# Patient Record
Sex: Female | Born: 1935 | Race: White | Hispanic: No | Marital: Married | Smoking: Former smoker
Health system: Southern US, Community
[De-identification: ages and names within clinical notes are randomized; demographics above are authoritative.]

## PROBLEM LIST (undated history)

## (undated) DIAGNOSIS — M5430 Sciatica, unspecified side: Secondary | ICD-10-CM

## (undated) DIAGNOSIS — M5126 Other intervertebral disc displacement, lumbar region: Secondary | ICD-10-CM

## (undated) DIAGNOSIS — M5136 Other intervertebral disc degeneration, lumbar region: Secondary | ICD-10-CM

## (undated) DIAGNOSIS — K589 Irritable bowel syndrome without diarrhea: Secondary | ICD-10-CM

## (undated) DIAGNOSIS — W19XXXA Unspecified fall, initial encounter: Secondary | ICD-10-CM

## (undated) DIAGNOSIS — G500 Trigeminal neuralgia: Secondary | ICD-10-CM

## (undated) DIAGNOSIS — K219 Gastro-esophageal reflux disease without esophagitis: Secondary | ICD-10-CM

## (undated) DIAGNOSIS — I639 Cerebral infarction, unspecified: Secondary | ICD-10-CM

## (undated) DIAGNOSIS — I609 Nontraumatic subarachnoid hemorrhage, unspecified: Secondary | ICD-10-CM

## (undated) DIAGNOSIS — M25569 Pain in unspecified knee: Secondary | ICD-10-CM

## (undated) DIAGNOSIS — K635 Polyp of colon: Secondary | ICD-10-CM

## (undated) DIAGNOSIS — K5792 Diverticulitis of intestine, part unspecified, without perforation or abscess without bleeding: Secondary | ICD-10-CM

## (undated) DIAGNOSIS — R42 Dizziness and giddiness: Secondary | ICD-10-CM

## (undated) DIAGNOSIS — G458 Other transient cerebral ischemic attacks and related syndromes: Secondary | ICD-10-CM

## (undated) DIAGNOSIS — M1990 Unspecified osteoarthritis, unspecified site: Secondary | ICD-10-CM

## (undated) DIAGNOSIS — M51369 Other intervertebral disc degeneration, lumbar region without mention of lumbar back pain or lower extremity pain: Secondary | ICD-10-CM

## (undated) DIAGNOSIS — N200 Calculus of kidney: Secondary | ICD-10-CM

## (undated) DIAGNOSIS — E7800 Pure hypercholesterolemia, unspecified: Secondary | ICD-10-CM

## (undated) DIAGNOSIS — C801 Malignant (primary) neoplasm, unspecified: Secondary | ICD-10-CM

## (undated) HISTORY — PX: PROLAPSED UTERINE FIBROID LIGATION: SHX5400

## (undated) HISTORY — DX: Polyp of colon: K63.5

## (undated) HISTORY — PX: FOOT SURGERY: SHX648

## (undated) HISTORY — DX: Nontraumatic subarachnoid hemorrhage, unspecified: I60.9

## (undated) HISTORY — PX: EYE SURGERY: SHX253

## (undated) HISTORY — DX: Cerebral infarction, unspecified: I63.9

## (undated) HISTORY — PX: LITHOTRIPSY: SUR834

## (undated) HISTORY — PX: BREAST BIOPSY: SHX20

## (undated) HISTORY — PX: CATARACT EXTRACTION: SUR2

## (undated) HISTORY — DX: Dizziness and giddiness: R42

## (undated) HISTORY — DX: Diverticulitis of intestine, part unspecified, without perforation or abscess without bleeding: K57.92

## (undated) HISTORY — PX: BREAST SURGERY: SHX581

## (undated) HISTORY — PX: OTHER SURGICAL HISTORY: SHX169

## (undated) HISTORY — DX: Other transient cerebral ischemic attacks and related syndromes: G45.8

---
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
## 2008-04-20 ENCOUNTER — Ambulatory Visit: Payer: Self-pay | Admitting: Internal Medicine

## 2008-04-20 ENCOUNTER — Encounter: Payer: Self-pay | Admitting: Gastroenterology

## 2008-04-24 LAB — CONVERTED CEMR LAB
Band Neutrophils: 0 % (ref 0–10)
Basophils Absolute: 0 10*3/uL (ref 0.0–0.1)
Basophils Relative: 1 % (ref 0–1)
Eosinophils Absolute: 0.1 10*3/uL (ref 0.0–0.7)
Eosinophils Relative: 2 % (ref 0–5)
Free T4: 0.74 ng/dL — ABNORMAL LOW (ref 0.89–1.80)
HCT: 41.5 % (ref 36.0–46.0)
Hemoglobin: 13.7 g/dL (ref 12.0–15.0)
IgA: 353 mg/dL (ref 68–378)
Lymphocytes Relative: 42 % (ref 12–46)
Lymphs Abs: 2.6 10*3/uL (ref 0.7–4.0)
MCHC: 33 g/dL (ref 30.0–36.0)
MCV: 88.7 fL (ref 78.0–100.0)
Monocytes Absolute: 0.6 10*3/uL (ref 0.1–1.0)
Monocytes Relative: 10 % (ref 3–12)
Neutro Abs: 2.8 10*3/uL (ref 1.7–7.7)
Neutrophils Relative %: 45 % (ref 43–77)
Platelets: 204 10*3/uL (ref 150–400)
RBC: 4.68 M/uL (ref 3.87–5.11)
RDW: 14.4 % (ref 11.5–15.5)
TSH: 1.498 microintl units/mL (ref 0.350–4.50)
Tissue Transglutaminase Ab, IgA: 2 units (ref <7)
WBC: 6.1 10*3/uL (ref 4.0–10.5)

## 2008-04-27 ENCOUNTER — Encounter: Payer: Self-pay | Admitting: Internal Medicine

## 2008-05-04 ENCOUNTER — Encounter: Payer: Self-pay | Admitting: Gastroenterology

## 2008-05-07 ENCOUNTER — Encounter: Payer: Self-pay | Admitting: Internal Medicine

## 2008-05-27 ENCOUNTER — Ambulatory Visit (HOSPITAL_COMMUNITY): Admission: RE | Admit: 2008-05-27 | Discharge: 2008-05-27 | Payer: Self-pay | Admitting: Internal Medicine

## 2008-05-27 ENCOUNTER — Encounter: Payer: Self-pay | Admitting: Internal Medicine

## 2008-05-27 ENCOUNTER — Ambulatory Visit: Payer: Self-pay | Admitting: Internal Medicine

## 2008-05-27 HISTORY — PX: COLONOSCOPY: SHX5424

## 2008-05-27 HISTORY — PX: ESOPHAGOGASTRODUODENOSCOPY: SHX1529

## 2008-05-29 ENCOUNTER — Encounter: Payer: Self-pay | Admitting: Internal Medicine

## 2008-08-14 DIAGNOSIS — E785 Hyperlipidemia, unspecified: Secondary | ICD-10-CM | POA: Insufficient documentation

## 2008-08-14 DIAGNOSIS — R159 Full incontinence of feces: Secondary | ICD-10-CM | POA: Insufficient documentation

## 2008-08-14 DIAGNOSIS — R42 Dizziness and giddiness: Secondary | ICD-10-CM | POA: Insufficient documentation

## 2008-08-14 DIAGNOSIS — R109 Unspecified abdominal pain: Secondary | ICD-10-CM | POA: Insufficient documentation

## 2008-08-14 DIAGNOSIS — K219 Gastro-esophageal reflux disease without esophagitis: Secondary | ICD-10-CM | POA: Insufficient documentation

## 2008-08-14 DIAGNOSIS — D126 Benign neoplasm of colon, unspecified: Secondary | ICD-10-CM | POA: Insufficient documentation

## 2008-08-25 ENCOUNTER — Ambulatory Visit: Payer: Self-pay | Admitting: Internal Medicine

## 2008-08-25 ENCOUNTER — Encounter: Payer: Self-pay | Admitting: Gastroenterology

## 2008-08-25 DIAGNOSIS — R197 Diarrhea, unspecified: Secondary | ICD-10-CM | POA: Insufficient documentation

## 2008-08-25 DIAGNOSIS — R1031 Right lower quadrant pain: Secondary | ICD-10-CM | POA: Insufficient documentation

## 2009-12-27 ENCOUNTER — Ambulatory Visit (HOSPITAL_COMMUNITY): Admission: RE | Admit: 2009-12-27 | Discharge: 2009-12-27 | Payer: Self-pay | Admitting: Pulmonary Disease

## 2010-03-23 NOTE — Assessment & Plan Note (Signed)
Summary: fu ov 3 mo from procedure/ams   Visit Type:  Follow-up Visit Primary Care Provider:  Karyl Kinnier  Chief Complaint:  F/U procedure.  History of Present Illness: Patient is here for 3 month f/u.  She has h/o intermittent bouts of abd pain and diarrhea with sometimes months or years inbetween episodes.  Recent TCS/EGD showed small hh, hyperplastic rectal polyp, sigmoid tics, benign random colon biopsies.  She has tried Hyomax twice for mild abd discomfort with good relief.  She has had some heartburn on protonix and wants to go back on prilosec otc.  This was switched recently to see if prilosec was the cause of her gi symptoms.  She has one to two bms daily.  She has infrequent RLQ stabbing pain which is short-lived.     Current Medications (verified): 1)  Citracal/vitamin D 250-200 Mg-Unit Tabs (Calcium Citrate-Vitamin D) .... Once Daily 2)  B Complex  Tabs (B Complex Vitamins) .... Once Daily 3)  Niacin 500 Mg Tabs (Niacin) .... One By Mouth Bid 4)  Vitamin E 400 Unit Caps (Vitamin E) .... Once Daily 5)  Garlic 500 Mg Tabs (Garlic) .... Once Daily 6)  Meclizine Hcl 25 Mg Tabs (Meclizine Hcl) .... As Needed 7)  Protonix 40 Mg Tbec (Pantoprazole Sodium) .... Take 1 Tablet By Mouth Once A Day 8)  Digestive Advantage .... Once Daily 9)  Lopid 600 Mg Tabs (Gemfibrozil) .... Once Daily 10)  Citracal Plus .... Once Daily 11)  Vitamin C .... Once Daily 12)  Levsin/sl 0.125 Mg Subl (Hyoscyamine Sulfate) .... As Needed  Allergies (verified): No Known Drug Allergies  Review of Systems General:  Denies fever, chills, and weight loss. CV:  Denies chest pains and dyspnea on exertion. Resp:  Denies dyspnea at rest, dyspnea with exercise, and cough. GI:  See HPI. GU:  Denies urinary burning, blood in urine, abnormal vaginal bleeding, and vaginal discharge. Heme:  Denies bleeding.  Vital Signs:  Patient profile:   77 year old female Height:      60 inches Weight:      155.50 pounds  BMI:     30.48 Temp:     97.4 degrees F oral Pulse rate:   64 / minute BP sitting:   110 / 80  (left arm) Cuff size:   regular  Vitals Entered By: Cloria Spring LPN (August 25, 1608 9:24 AM)  Physical Exam  General:  Well developed, well nourished, no acute distress. Head:  Normocephalic and atraumatic. Eyes:  Sclera nonicteric Mouth:  OP moist Abdomen:  Bowel sounds normal.  Abdomen is soft, nontender, nondistended.  No rebound or guarding.  No hepatosplenomegaly, masses or hernias.  No abdominal bruits.  Extremities:  No clubbing, cyanosis, edema or deformities noted. Neurologic:  Alert and  oriented x4;  grossly normal neurologically. Skin:  Intact without significant lesions or rashes. Psych:  Alert and cooperative. Normal mood and affect.  Impression & Recommendations:  Problem # 1:  ABDOMINAL PAIN (ICD-789.00)  Intermittent abd cramps and diarrhea.  TCS unrevealing.  No further symptoms since initially seen in 3/10.  Will continue to monitor for now and she will use hyoscyamine sl as needed.  Problem # 2:  ABDOMINAL PAIN-RLQ (ICD-789.03)  Seems to be separate from above symptoms.  She will keep note of episodes so we can determine how frequent.  Currently c/o "rare" pain.  Encouraged her to consider gyn exam.  If symptoms persist, consider CT A/P.  She will let us know.  Problem # 3:  GERD (ICD-530.81)  Better control on omeprazole OTC.  She will resume 20mg  daily.  Stop protonix.    Problem # 4:  COLONIC POLYPS, HYPERPLASTIC (ICD-211.3) Due for TCS in 05/2018 health permitting.  Other Orders: Est. Patient Level II ([address])

## 2010-03-23 NOTE — Letter (Signed)
Summary: Internal Other/triage  Internal Other/triage   Imported By: Cloria Spring LPN 08/65/7846 96:29:52  _____________________________________________________________________  External Attachment:    Type:   Image     Comment:   External Document

## 2010-03-23 NOTE — Miscellaneous (Signed)
Summary: needs EGD/TCS    Please let patient know, I spoke with Dr. Jena Gauss and he wants to proceed with EGD/TCS to further evaluate diarrhea and chronic GERD.  Please schedule.  Appended Document: needs EGD/TCS Pt is scheduled for 05/27/08 @ 8:00 AM for EGD/TCS. She said she spoke with Verlon Au about the  difficulty she has had previously with drinking alot of liquid.Would like to know if she can do something different than the regular Prep.  Appended Document: needs EGD/TCS Recommend Miralax prep with the Dulcolax tablets.  If she is having any issues with constipation right now she should also take an additional 10mg  of Dulcolax by mouth the day before she starts the Miralax prep.  Let me know if you have any questions.  Appended Document: needs EGD/TCS [facility] for pt to call.  Appended Document: needs EGD/TCS Pt informed. Copy of Miralax Prep mailed to pt.

## 2010-03-23 NOTE — Letter (Signed)
Summary: release of records form  release of records form   Imported By: Elinor Parkinson 08/25/2008 10:01:48  _____________________________________________________________________  External Attachment:    Type:   Image     Comment:   External Document

## 2010-03-23 NOTE — Letter (Signed)
Summary: Patient Notice, Colon Biopsy Results  [facility]  [address]   [place], [place] [address]   Phone: [phone]  Fax: [phone]       May 29, 2008   JAYMEE TILSON [address] [place], [place]  [address] 05-Sep-1931    Dear Ms. Brearley,  I'm pleased to inform you that the biopsies taken from your colon recently showed no sign of cancer,  inflammation or infection. Pllease continue with the treatment plan as outlined on the day of your exam.  Call us if you are having persistent problems or have questions about your condition that have not been fully answered at this time.  Sincerely,    R. Roetta Sessions MD  [facility] Ph: [phone]    Fax: [phone]

## 2010-07-05 NOTE — Op Note (Signed)
NAME:  Shari Vincent, Shari Vincent               ACCOUNT NO.:  [id number]   MEDICAL RECORD NO.:  [id number]          PATIENT TYPE:  AMB   LOCATION:  DAY                           FACILITY:  [facility]   PHYSICIAN:  R. Roetta Sessions, M.D. DATE OF BIRTH:  [birth year]   DATE OF PROCEDURE:  05/27/2008  DATE OF DISCHARGE:                               OPERATIVE REPORT   Diagnostic EGD followed by colonoscopy and biopsy.   INDICATIONS FOR PROCEDURE:  A 77 year old lady with a intermittent bouts  of abdominal pain and diarrhea, punctuated by months and sometimes years  in between symptoms.  We saw her in the office in consultation on April 20, 2008 at the request of Dr. Karyl Kinnier up in [place].  Her celiac  antibody panel came back negative.  Records regarding colonoscopy done  previously by Dr. Alen Bleacher demonstrated left-sided diverticula and  hyperplastic rectal polyp.  She does describe rather strange symptoms at  times, including dizziness and diaphoresis with attacks.  Her attacks  are few and far between.  For instance, her attack was in February of  this year, one before that was in August 2009.  She does relate gas,  bloating and GI distress with certain foods including broccoli and  cauliflower.  EGD and colonoscopy are now being done to further evaluate  her symptoms.  She has longstanding gastroesophageal reflux disease that  has been well-controlled on omeprazole.  Risks, benefits, alternatives  and limitations have discussed, questions answered.  Please see the  documentation in the medical record.   PROCEDURE NOTE:  Oxygen saturation, blood pressure, pulse oximetry were  monitored throughout the entirety of both procedures.   CONSCIOUS SEDATION:  Versed 5 mg IV and Demerol 75 mg IV in divided  doses.   INSTRUMENT:  Pentax video chip system.   FINDINGS:  EGD:  Examination of the tubular esophagus revealed normal-  appearing esophageal mucosa.  EG junction was easily traversed.   Stomach:   Gastric cavity was empty, insufflated well with air.  Thorough  examination of the gastric mucosa, including retroflexed in the proximal  stomach, esophagogastric junction demonstrated a small hiatal hernia and  minimal polypoid appearing antral mucosa.  This mucosa appeared to be  benign.  There is no infiltrating process, ulcer or other abnormality.  Pylorus was patent and easily traversed.  Examination of the bulb  second, and third portion revealed fairly normal appearing duodenal  mucosa.   THERAPEUTIC/DIAGNOSTIC MANEUVERS PERFORMED:  None.   The patient tolerated procedure well and was prepared for colonoscopy.   Digital rectal exam revealed no abnormalities.   Endoscopic findings: The prep was good.   Colon:  The colonic mucosa was surveyed from the rectosigmoid junction  through the left, transverse and right colon into the appendiceal  orifice, ileocecal valve and cecum.  These structures were well seen and  photographed for the record.  I attempted to intubate the ileocecal  valve and was unable to do so.  From this level, the scope was slowly  and cautiously withdrawn.  All previously mentioned mucosal surfaces  were again seen.  The patient has scattered sigmoid diverticula.  Remainder of the colonic mucosa appeared normal.  I biopsied the mid  descending segment to rule out microscopic colitis.  I  pulled the scope  down to the rectum where the patient had a couple mammillations  and a  hyperplastic polyp which was cold biopsied. I also biopsied the rectal  mucosa to rule out microscopic colitis.  The rectal vault small, I was  unable to retroflex but for the same reason I was able see the rectal  mucosa en face and it appeared normal.  Cecal withdrawal time 9 minutes.  The patient tolerated both procedures well, was reactive in endoscopy.   IMPRESSION ON EGD:  1. Normal esophagus.  2. Minimal polypoid antral mucosa, not significant.  3. Small hiatal hernia.  4.  Otherwise stomach appeared normal polyp.  5. Pylorus patent, normal D1 through D3.   COLONOSCOPY FINDINGS:  1. Diminutive rectal polyp, status post cold biopsy removal.  2. Status biopsy to rule out microscopic proctocolitis.  3. Sigmoid diverticula.  4. Remainder of colonic mucosa appeared normal, status post biopsy of      descending segment.   RECOMMENDATIONS:  1. Will just have her stop omeprazole and start her on Protonix 40 mg      orally daily as omeprazole is rarely abdominal pain, diarrhea.  2. Levsin sublingual 0.125 mg to have on hand in case of an attack.      She is to take one under the tongue if she has an attack, limit to      no more than 3 daily.  3. Digestive Advantage for gas bloat/diarrhea 1 capsule daily.  4. Follow-up on path.  5. Appointment to see [place] back in the office in 3 months.      Jonathon Bellows, M.D.  Electronically Signed     RMR/MEDQ  D:  05/27/2008  T:  05/27/2008  Job:  [id number]   cc:   Karyl Kinnier, MD  [place], [place]

## 2010-07-05 NOTE — Consult Note (Signed)
NAME:  Shari Vincent, Shari Vincent               ACCOUNT NO.:  [id number]   MEDICAL RECORD NO.:  [id number]          PATIENT TYPE:  AMB   LOCATION:  DAY                           FACILITY:  [facility]   PHYSICIAN:  Shari Vincent, M.D. DATE OF BIRTH:  01/13/1932   DATE OF CONSULTATION:  04/20/2008  DATE OF DISCHARGE:                                 CONSULTATION   REASON FOR CONSULTATION:  Intermittent abdominal pain and diarrhea with  stool incontinence.   PHYSICIAN REQUESTING CONSULTATION:  Shari Kinnier, MD   HISTORY OF PRESENT ILLNESS:  The patient is a very pleasant 77 year old  lady, patient of Dr. Karyl Vincent, who presents today at her request  for further evaluation of intermittent abdominal pain associated with  diarrhea and fecal incontinence.  The patient tells me she has had these  symptoms for well over 30 years.  Her symptoms have been very sporadic  and actually at times, she may go 2 or 3 years at a time without any  problems.  However, over the last 6 months, she has had 2 severe  episodes, which she calls flares.  Her symptoms usually begin after a  meal.  The last time, she has had some chicken and rice soup.  Around 10  p.m., she started feeling some epigastric discomfort.  The pain then  localizes around the right lower quadrant region.  She becomes very hot  and sweaty.  She usually has multiple stools with loose stools and  becomes faint, and usually she will lay down on the floor in her  bathroom and become incontinent of stool.  She generally has 1 or 2  episodes like this and then the symptoms go away.  This last time, her  symptoms lasted for about 6 hours and she had about 3 episodes of  profuse diarrhea associated with this.  She generally does have some  limited vomiting.  She denies any blood in the stool or melena.  Generally, she does not have fever, although last episode, she had a  fever of 100 towards the end of her symptoms.  In between, generally  does not have  any GI problems.  She denies any interim abdominal pain.  She states she has about 1-2 stools a day, although she notes that her  stools are very thin ever since her colonoscopy in 2006.  She notes that  if she eats broccoli or certain beans, she will have some rectal  burning, and she uses Preparation H for this.  She does have  intermittent nausea associated with dizziness for which she takes  meclizine for years.  She denies any weight loss.  Again, her only  workup has been a screening colonoscopy by Dr. Aleene Vincent and she tells  me that it was normal.  She does have chronic GERD, has been on  omeprazole for years with good results.  Recently, labs included LFTs,  which were normal.  She was given a trial of dicyclomine to use as  needed, but she has not had any further episodes.   CURRENT MEDICATIONS:  1. Omeprazole 20 mg daily.  2. Citracal with vitamin D daily.  3. Multivitamin with magnesium daily.  4. Vitamin B complex daily.  5. Niacin 1 daily.  6. Vitamin E 400 International Unit daily.  7. Garlic daily.  8. Meclizine 25 mg p.Sharin.   ALLERGIES:  No known drug allergies.   PAST MEDICAL HISTORY:  1. Chronic GERD.  2. Hypercholesterolemia.  3. Vertigo.  4. History of kidney stones, requiring lithotripsy as well as a stent      back in 1992.  5. Colonoscopy as above.  6. She had surgery for uterine prolapse in 1960.  7. She had benign right breast biopsy in 1990.  8. She had foot surgery in 2000 due to her fracture.  9. Bilateral cataract extractions.  10.She had right shoulder tendon repair in 2008.   FAMILY HISTORY:  Paternal grandmother had intestinal cancer.  Maternal  grandmother had stomach cancer.  No family history of IBD or celiac  disease.   SOCIAL HISTORY:  She is married and has 3 children.  She is retired.  She quit smoking in 1972.  No alcohol use.   REVIEW OF SYSTEMS:  GI:  See HPI.  CONSTITUTIONAL:  No weight loss.  CARDIOPULMONARY:  No chest pain,  shortness of breath, palpitations, or  cough.  GENITOURINARY:  No dysuria or hematuria.   PHYSICAL EXAMINATION:  VITAL SIGNS:  Weight 162, height 5 feet, temp  97.4, blood pressure 128/80, and pulse 60.  GENERAL:  Pleasant, elderly Caucasian female in no acute distress.  SKIN:  Warm and dry.  No jaundice.  HEENT:  Sclerae nonicteric.  Oropharyngeal mucosa moist and pink.  No  lesions, erythema, or exudate.  No lymphadenopathy or thyromegaly.  CHEST:  Lungs are clear to auscultation.  CARDIOVASCULAR:  Regular rate and rhythm.  Normal S1 and S2.  No  murmurs, rubs, or gallops.  ABDOMEN:  Positive bowel sounds.  Abdomen is soft, nontender, and  nondistended.  No organomegaly or masses.  No rebound or guarding.  No  abdominal bruits or hernias.  RECTAL:  No lesions externally.  No masses in the rectal vault.  No  stool present, brown secretion, and heme negative.  She has good  anorectal tone.  Rectal exam is nontender.  LOWER EXTREMITIES:  No edema.   IMPRESSION:  Shari Vincent is a 77 year old lady with chronic intermittent  bouts with abdominal pain and diarrhea, which has been reoccurring for  over 30 years.  Symptoms are very sporadic and may occur every couple of  years, but recently had been more frequent.  In between, she really has  no gastrointestinal symptoms, although, she notes that her stools are  more thin in caliber.  She does have chronic gastroesophageal reflux  disease, which is well controlled on omeprazole.  She has not noted any  particular food, which causes her symptoms.  Family history is  significant for reported intestinal and stomach cancer.  Etiology of  the symptoms are unclear at this time.  We would consider celiac  disease, inflammatory bowel disease, microscopic colitis, stricture or  intermittent obstruction, irritable bowel syndrome all as potential  possibilities.  She has chronic gastroesophageal reflux disease well  controlled, has never had  endoscopic evaluation and would offer one at  some point as well to rule out complications such as Barrett esophagus.   PLAN:  1. Initially, we will retrieve records from Dr. Vangie Vincent office      regarding her prior colonoscopy.  2. We will check a TSH,  free T4, CBC, celiac antibody panel, and IgA      level.  3. We will likely offer her a colonoscopy with random biopsies plus or      minus EGD in the near future.  Based on these findings, she may      need to have imaging studies just to make sure that she has nothing      to suggest strictures or obstruction.  4. Further recommendations to follow.   I would like to thank Dr. Karyl Vincent for allowing [place] to take part in  the care of this patient.      Tana Coast, P.AJonathon Bellows, M.D.  Electronically Signed    LL/MEDQ  D:  04/20/2008  T:  04/21/2008  Job:  [id number]   cc:   Shari Kinnier, MD

## 2010-09-05 ENCOUNTER — Emergency Department (HOSPITAL_COMMUNITY)
Admission: EM | Admit: 2010-09-05 | Discharge: 2010-09-05 | Disposition: A | Discharge status: Home / Self Care | Payer: Medicare Other | Attending: Emergency Medicine | Admitting: Emergency Medicine

## 2010-09-05 ENCOUNTER — Encounter: Payer: Self-pay | Admitting: *Deleted

## 2010-09-05 ENCOUNTER — Emergency Department (HOSPITAL_COMMUNITY): Payer: Medicare Other

## 2010-09-05 DIAGNOSIS — R110 Nausea: Secondary | ICD-10-CM | POA: Insufficient documentation

## 2010-09-05 DIAGNOSIS — R51 Headache: Secondary | ICD-10-CM | POA: Insufficient documentation

## 2010-09-05 HISTORY — DX: Gastro-esophageal reflux disease without esophagitis: K21.9

## 2010-09-05 HISTORY — DX: Pure hypercholesterolemia, unspecified: E78.00

## 2010-09-05 LAB — BASIC METABOLIC PANEL
BUN: 13 mg/dL (ref 6–23)
CO2: 23 mEq/L (ref 19–32)
Calcium: 10.1 mg/dL (ref 8.4–10.5)
Chloride: 105 mEq/L (ref 96–112)
Creatinine, Ser: 0.71 mg/dL (ref 0.50–1.10)
GFR calc Af Amer: >60 mL/min (ref >60)
GFR calc non Af Amer: >60 mL/min (ref >60)
Glucose, Bld: 102 mg/dL — ABNORMAL HIGH (ref 70–99)
Potassium: 3.8 mEq/L (ref 3.5–5.1)
Sodium: 141 mEq/L (ref 135–145)

## 2010-09-05 LAB — SEDIMENTATION RATE: Sed Rate: 10 mm/hr (ref 0–22)

## 2010-09-05 MED ORDER — SODIUM CHLORIDE 0.9 % IV SOLN: Freq: Once | INTRAVENOUS | Status: DC

## 2010-09-05 MED ORDER — KETOROLAC TROMETHAMINE 30 MG/ML IJ SOLN
30.0000 mg | Freq: Once | INTRAMUSCULAR | Status: AC
  Administered 2010-09-05: 30 mg via INTRAVENOUS
  Filled 2010-09-05: qty 1

## 2010-09-05 MED ORDER — PROMETHAZINE HCL 25 MG PO TABS: 25.0000 mg | ORAL_TABLET | Freq: Four times a day (QID) | ORAL | Status: AC | PRN

## 2010-09-05 MED ORDER — HYDROMORPHONE HCL 1 MG/ML IJ SOLN
1.0000 mg | Freq: Once | INTRAMUSCULAR | Status: AC
  Administered 2010-09-05: 1 mg via INTRAVENOUS
  Filled 2010-09-05: qty 1

## 2010-09-05 MED ORDER — MORPHINE SULFATE 2 MG/ML IJ SOLN
2.0000 mg | Freq: Once | INTRAMUSCULAR | Status: AC
  Administered 2010-09-05: 2 mg via INTRAVENOUS
  Filled 2010-09-05: qty 1

## 2010-09-05 MED ORDER — OXYCODONE-ACETAMINOPHEN 5-325 MG PO TABS: 2.0000 | ORAL_TABLET | ORAL | Status: AC | PRN

## 2010-09-05 MED ORDER — PROMETHAZINE HCL 25 MG/ML IJ SOLN
25.0000 mg | Freq: Once | INTRAMUSCULAR | Status: AC
  Administered 2010-09-05: 25 mg via INTRAVENOUS
  Filled 2010-09-05: qty 1

## 2010-09-05 NOTE — ED Notes (Signed)
Pt was given a cup of ice water. Family at bedside. NAD noted at this time.

## 2010-09-05 NOTE — ED Notes (Signed)
New shift. edp in to eval

## 2010-09-05 NOTE — ED Provider Notes (Signed)
History     Chief Complaint  Patient presents with  . Headache    Pt c/o headache on and off x 1 month. Pt states she woke up with the worst pain ever in her head this morning. Pt states she has had some blurred vision in the past month.   Patient is a 77 y.o. female presenting with headaches. The history is provided by the patient and a relative. No language interpreter was used.  Headache  This is a new problem. The current episode started 3 to 5 hours ago. The problem occurs constantly. The problem has not changed since onset.The headache is associated with nothing. The pain is located in the temporal region. The quality of the pain is described as throbbing. The pain is moderate. The pain does not radiate. Associated symptoms include nausea. Pertinent negatives include no fever, no chest pressure, no shortness of breath and no vomiting. Treatments tried: cold compress. The treatment provided mild relief.  C/o throbbing HA onset approximately 3 hours ago with associated nausea and persistent since. States HA located behind her eyes. HA is aggravated by bending over and relieved with cold compress. Notes HA is not aggravated with eating or head movement. Per family member, patient has had persistent HAs for multiple weeks. Family member also notes patient was seen 2 weeks ago at [facility] and was told if HA persisted that she would need a CT-Head otherwise the likely cause of the HAs was a result of sinus pressure. Denies fever, blurred vision, decreased appetite, diaphoresis, vomiting and jaw pain.  Past Medical History  Diagnosis Date  . High cholesterol   . Acid reflux     Past Surgical History  Procedure Date  . Foot surgery   . Uterus tacking     Family History  Problem Relation Age of Onset  . Diabetes Mother   . Stroke Father     History  Substance Use Topics  . Smoking status: Not on file  . Smokeless tobacco: Not on file  . Alcohol Use: No    OB History    Grav Para  Term Preterm Abortions TAB SAB Ect Mult Living                  Review of Systems  Constitutional: Negative for fever.  Respiratory: Negative for shortness of breath.   Gastrointestinal: Positive for nausea. Negative for vomiting.  Neurological: Positive for headaches.  All other systems reviewed and are negative.  All other systems negative except as noted in HPI.   Physical Exam  BP 145/65  Pulse 61  Temp 98.4 F (36.9 C)  Resp 20  Ht 5' (1.524 m)  Wt 159 lb (72.122 kg)  BMI 31.05 kg/m2  SpO2 99%  Physical Exam  Nursing note and vitals reviewed. Constitutional: She is oriented to person, place, and time. She appears well-developed and well-nourished.       Hypertensive.   HENT:  Head: Normocephalic and atraumatic.       Tenderness over right temporal region.   Eyes: Conjunctivae and EOM are normal. Pupils are equal, round, and reactive to light.  Neck: Normal range of motion. Neck supple.       No carotid bruits.   Cardiovascular: Normal rate, regular rhythm, normal heart sounds, intact distal pulses and normal pulses.  Exam reveals no gallop and no friction rub.   No murmur heard. Pulmonary/Chest: Effort normal and breath sounds normal. She has no wheezes.  Abdominal: Soft. Bowel sounds are  normal. She exhibits no distension. There is no tenderness.  Musculoskeletal: Normal range of motion.  Neurological: She is alert and oriented to person, place, and time. She has normal strength. She is not disoriented. No cranial nerve deficit or sensory deficit.  Skin: Skin is warm and dry.  Psychiatric: She has a normal mood and affect. Her behavior is normal.    ED Course  Procedures  MDM Right temporal ha in elderly lady who rarely gets ha. Described as sharp and throbbing.  Neuro exam nl. No meningeal signs. Afebrile. Head ct no mass or stroke.   Sed rate nl. No TA.   Ha resolved in ed.    Chart written by Danielle Dess for Dr. Weldon Inches  I personally performed  the services described in this documentation, which was scribed in my presence. The recorded information has been reviewed and considered.  9:45 AM pt says pain has "eased off" but is not gone.  I explained results of ct and delay with one more lab.  11:43 AM pain improved but not gone. Explained esr also neg and no signs of TA.  Nicholes Stairs, MD 09/05/10 1304

## 2010-09-05 NOTE — ED Notes (Signed)
Pt assisted to bathroom. States her head is hurting really bad. Lights hurt her eyes.

## 2010-09-05 NOTE — ED Notes (Signed)
Pt also c/o nausea and took 1 tramadol without relief.

## 2010-09-05 NOTE — ED Notes (Signed)
Pt states pain med helped some. States her head still hurts.

## 2010-09-07 ENCOUNTER — Other Ambulatory Visit: Payer: Self-pay | Admitting: Neurology

## 2010-09-09 ENCOUNTER — Ambulatory Visit (HOSPITAL_COMMUNITY)
Admission: RE | Admit: 2010-09-09 | Discharge: 2010-09-09 | Disposition: A | Discharge status: Home / Self Care | Payer: Medicare Other | Source: Ambulatory Visit | Attending: Neurology | Admitting: Neurology

## 2010-09-09 DIAGNOSIS — R42 Dizziness and giddiness: Secondary | ICD-10-CM | POA: Insufficient documentation

## 2010-09-09 DIAGNOSIS — R51 Headache: Secondary | ICD-10-CM | POA: Insufficient documentation

## 2010-09-09 DIAGNOSIS — G458 Other transient cerebral ischemic attacks and related syndromes: Secondary | ICD-10-CM | POA: Insufficient documentation

## 2010-09-09 DIAGNOSIS — G319 Degenerative disease of nervous system, unspecified: Secondary | ICD-10-CM | POA: Insufficient documentation

## 2011-03-02 ENCOUNTER — Other Ambulatory Visit: Payer: Self-pay | Admitting: Neurology

## 2011-03-02 ENCOUNTER — Ambulatory Visit (HOSPITAL_COMMUNITY)
Admission: RE | Admit: 2011-03-02 | Discharge: 2011-03-02 | Disposition: A | Discharge status: Home / Self Care | Payer: Medicare Other | Source: Ambulatory Visit | Attending: Neurology | Admitting: Neurology

## 2011-03-02 DIAGNOSIS — H538 Other visual disturbances: Secondary | ICD-10-CM

## 2011-03-02 DIAGNOSIS — I6529 Occlusion and stenosis of unspecified carotid artery: Secondary | ICD-10-CM | POA: Insufficient documentation

## 2011-03-23 ENCOUNTER — Emergency Department (HOSPITAL_COMMUNITY)
Admission: EM | Admit: 2011-03-23 | Discharge: 2011-03-23 | Discharge status: Absent without leave | Payer: Self-pay | Source: Home / Self Care

## 2011-08-22 ENCOUNTER — Other Ambulatory Visit (HOSPITAL_COMMUNITY): Payer: Self-pay | Admitting: Nurse Practitioner

## 2011-08-22 DIAGNOSIS — R930 Abnormal findings on diagnostic imaging of skull and head, not elsewhere classified: Secondary | ICD-10-CM

## 2011-08-28 ENCOUNTER — Encounter (HOSPITAL_COMMUNITY): Payer: Self-pay

## 2011-08-28 ENCOUNTER — Ambulatory Visit (HOSPITAL_COMMUNITY)
Admission: RE | Admit: 2011-08-28 | Discharge: 2011-08-28 | Disposition: A | Discharge status: Home / Self Care | Payer: Medicare Other | Source: Ambulatory Visit | Attending: Family Medicine | Admitting: Family Medicine

## 2011-08-28 DIAGNOSIS — R911 Solitary pulmonary nodule: Secondary | ICD-10-CM | POA: Insufficient documentation

## 2011-08-28 DIAGNOSIS — R930 Abnormal findings on diagnostic imaging of skull and head, not elsewhere classified: Secondary | ICD-10-CM

## 2011-08-28 HISTORY — DX: Malignant (primary) neoplasm, unspecified: C80.1

## 2011-08-28 MED ORDER — IOHEXOL 300 MG/ML  SOLN
80.0000 mL | Freq: Once | INTRAMUSCULAR | Status: AC | PRN
  Administered 2011-08-28: 80 mL via INTRAVENOUS

## 2012-07-23 ENCOUNTER — Encounter: Payer: Self-pay | Admitting: Orthopedic Surgery

## 2012-07-23 ENCOUNTER — Ambulatory Visit (INDEPENDENT_AMBULATORY_CARE_PROVIDER_SITE_OTHER): Payer: Medicare Other | Admitting: Orthopedic Surgery

## 2012-07-23 ENCOUNTER — Ambulatory Visit (INDEPENDENT_AMBULATORY_CARE_PROVIDER_SITE_OTHER): Payer: Medicare Other

## 2012-07-23 VITALS — BP 116/62 | Ht 60.0 in | Wt 159.0 lb

## 2012-07-23 DIAGNOSIS — M179 Osteoarthritis of knee, unspecified: Secondary | ICD-10-CM | POA: Insufficient documentation

## 2012-07-23 DIAGNOSIS — M25569 Pain in unspecified knee: Secondary | ICD-10-CM

## 2012-07-23 DIAGNOSIS — M1710 Unilateral primary osteoarthritis, unspecified knee: Secondary | ICD-10-CM

## 2012-07-23 DIAGNOSIS — I872 Venous insufficiency (chronic) (peripheral): Secondary | ICD-10-CM

## 2012-07-23 DIAGNOSIS — M25561 Pain in right knee: Secondary | ICD-10-CM

## 2012-07-23 DIAGNOSIS — I878 Other specified disorders of veins: Secondary | ICD-10-CM | POA: Insufficient documentation

## 2012-07-23 NOTE — Progress Notes (Signed)
Patient ID: Shari Vincent, female   DOB: 1931/08/11, 79 y.o.   MRN: [id number] Chief complaint right knee pain  This patient complains of gradual onset of anterior knee pain since March of this year she denies any trauma. She complains of sharp dull stabbing 5/10 constant knee pain which is worse when she's going up and down the stairs. She also notices some lateral knee pain and lateral leg pain and pain behind her right knee but denies any back pain.  Review of systems she has some wheezing and cough symptoms and constipation heartburn nausea and diarrhea urgency is noted changes in her skin with itching and redness especially in the lower legs she's been evaluated for venous stasis and has stockings she reports some dizziness and seasonal allergies although the other systems were negative  She's allergic to cephalosporin  She has a history of trigeminal neuralgia and acid reflux she's had uterine prolapse surgery cataracts left foot surgery biopsy of the right breast  She takes omeprazole 20 meclizine when necessary Centrum Silver vitamin C Citrucel plus vitamin D 3 vitamin B. super complex  His family history of heart disease cancer and diabetes  Social history she is married a housewife doesn't smoke or drink  BP 116/62  Ht 5' (1.524 m)  Wt 159 lb (72.122 kg)  BMI 31.05 kg/m2  General appearance is normal, the patient is alert and oriented x3 with normal mood and affect. Her body habitus is mesomorphic  She ambulates without any sick assistive devices.  Right and left upper extremities are aligned properly without contracture subluxation atrophy tremor or skin changes and pulses are normal in both hands  Left knee tenderness over the bursa medially otherwise no effusion range of motion is free and easy and normal stability and strength are normal skin is normal. Pulse and temperature are normal there is some edema and tenderness and redness consistent with venous stasis  disease  Right knee painful range of motion painful patellofemoral crepitance tenderness in the front of the knee lateral leg tenderness lateral compartment tenderness lumbar spine tenderness consistent with L5 root problem is redness also in the legs and some swelling consistent with peripheral edema and venous stasis disease she has multiple varicosities bilaterally. She has no skin rashes. Sensation is normal no pathologic reflexes coordination and balance are also normal  X-ray was done in the office it shows that she has a mildly mildly arthritic knee  Recommend injection and quadriceps strengthening return 2 months  Knee  Injection Procedure Note  Pre-operative Diagnosis: right knee oa  Post-operative Diagnosis: same  Indications: pain  Anesthesia: ethyl chloride   Procedure Details   Verbal consent was obtained for the procedure. Time out was completed.The joint was prepped with alcohol, followed by  Ethyl chloride spray and A 20 gauge needle was inserted into the knee via lateral approach; 4ml 1% lidocaine and 1 ml of depomedrol  was then injected into the joint . The needle was removed and the area cleansed and dressed.  Complications:  None; patient tolerated the procedure well.

## 2012-07-23 NOTE — Patient Instructions (Addendum)
You have received a steroid shot. 15% of patients experience increased pain at the injection site with in the next 24 hours. This is best treated with ice and tylenol extra strength 2 tabs every 8 hours. If you are still having pain please call the office.   Start home exercises

## 2012-09-24 ENCOUNTER — Ambulatory Visit (INDEPENDENT_AMBULATORY_CARE_PROVIDER_SITE_OTHER): Payer: Medicare Other | Admitting: Orthopedic Surgery

## 2012-09-24 VITALS — BP 140/78 | Ht 60.0 in | Wt 159.0 lb

## 2012-09-24 DIAGNOSIS — M1710 Unilateral primary osteoarthritis, unspecified knee: Secondary | ICD-10-CM

## 2012-09-24 DIAGNOSIS — M7051 Other bursitis of knee, right knee: Secondary | ICD-10-CM

## 2012-09-24 DIAGNOSIS — M76899 Other specified enthesopathies of unspecified lower limb, excluding foot: Secondary | ICD-10-CM

## 2012-09-24 NOTE — Progress Notes (Signed)
Patient ID: Shari Vincent, female   DOB: 05/23/1931, 79 y.o.   MRN: [id number] Chief Complaint  Patient presents with  . Follow-up    2 month recheck right knee following home exercises and injection    She has done well with home exercises and previous injection she says injections just about worn off of like a repeat injection if possible. Pain over her medial hamstrings and bursa area. The joints quiet flexion is normal strength is normal. Neurovascular exam is intact.  Ambulation is normal  Impression bursitis right knee  Recommend injection Knee, bursa  Injection Procedure Note  Pre-operative Diagnosis: right bursitis  Post-operative Diagnosis: same  Indications: pain  Anesthesia: ethyl chloride   Procedure Details   Verbal consent was obtained for the procedure. Time out was completed.The bursal area   was prepped with alcohol, followed by  Ethyl chloride spray and A 25 gauge needle was inserted into the right  pes bursa and  4ml 1% lidocaine and 1 ml of depomedrol  was then injected into the area  . The needle was removed and the area cleansed and dressed.  Complications:  None; patient tolerated the procedure well.

## 2013-02-19 ENCOUNTER — Other Ambulatory Visit: Payer: Self-pay | Admitting: Family Medicine

## 2013-02-19 DIAGNOSIS — R234 Changes in skin texture: Secondary | ICD-10-CM

## 2013-02-19 DIAGNOSIS — N644 Mastodynia: Secondary | ICD-10-CM

## 2013-02-26 ENCOUNTER — Ambulatory Visit
Admission: RE | Admit: 2013-02-26 | Discharge: 2013-02-26 | Disposition: A | Discharge status: Home / Self Care | Payer: Medicare Other | Source: Ambulatory Visit | Attending: Family Medicine | Admitting: Family Medicine

## 2013-02-26 ENCOUNTER — Other Ambulatory Visit: Payer: Self-pay | Admitting: Family Medicine

## 2013-02-26 DIAGNOSIS — N644 Mastodynia: Secondary | ICD-10-CM

## 2013-02-26 DIAGNOSIS — R234 Changes in skin texture: Secondary | ICD-10-CM

## 2013-03-03 ENCOUNTER — Other Ambulatory Visit: Payer: Self-pay | Admitting: Family Medicine

## 2013-03-03 ENCOUNTER — Ambulatory Visit
Admission: RE | Admit: 2013-03-03 | Discharge: 2013-03-03 | Disposition: A | Discharge status: Home / Self Care | Payer: Medicare Other | Source: Ambulatory Visit | Attending: Family Medicine | Admitting: Family Medicine

## 2013-03-03 DIAGNOSIS — N644 Mastodynia: Secondary | ICD-10-CM

## 2013-03-03 DIAGNOSIS — R234 Changes in skin texture: Secondary | ICD-10-CM

## 2013-04-15 ENCOUNTER — Other Ambulatory Visit: Payer: Self-pay | Admitting: Family Medicine

## 2013-04-15 DIAGNOSIS — N63 Unspecified lump in unspecified breast: Secondary | ICD-10-CM

## 2013-09-08 ENCOUNTER — Ambulatory Visit
Admission: RE | Admit: 2013-09-08 | Discharge: 2013-09-08 | Disposition: A | Discharge status: Home / Self Care | Payer: Medicare Other | Source: Ambulatory Visit | Attending: Family Medicine | Admitting: Family Medicine

## 2013-09-08 DIAGNOSIS — N63 Unspecified lump in unspecified breast: Secondary | ICD-10-CM

## 2014-02-02 ENCOUNTER — Other Ambulatory Visit: Payer: Self-pay

## 2014-02-02 DIAGNOSIS — Z1231 Encounter for screening mammogram for malignant neoplasm of breast: Secondary | ICD-10-CM

## 2014-03-11 ENCOUNTER — Ambulatory Visit
Admission: RE | Admit: 2014-03-11 | Discharge: 2014-03-11 | Disposition: A | Discharge status: Home / Self Care | Payer: Medicare HMO | Source: Ambulatory Visit

## 2014-03-11 DIAGNOSIS — Z1231 Encounter for screening mammogram for malignant neoplasm of breast: Secondary | ICD-10-CM

## 2014-03-26 ENCOUNTER — Ambulatory Visit (INDEPENDENT_AMBULATORY_CARE_PROVIDER_SITE_OTHER): Payer: Medicare HMO

## 2014-03-26 ENCOUNTER — Ambulatory Visit (INDEPENDENT_AMBULATORY_CARE_PROVIDER_SITE_OTHER): Payer: Medicare HMO | Admitting: Orthopedic Surgery

## 2014-03-26 ENCOUNTER — Encounter: Payer: Self-pay | Admitting: Orthopedic Surgery

## 2014-03-26 VITALS — BP 120/73 | Ht 60.0 in | Wt 161.0 lb

## 2014-03-26 DIAGNOSIS — M25561 Pain in right knee: Secondary | ICD-10-CM

## 2014-03-26 DIAGNOSIS — M1711 Unilateral primary osteoarthritis, right knee: Secondary | ICD-10-CM

## 2014-03-26 DIAGNOSIS — IMO0001 Reserved for inherently not codable concepts without codable children: Secondary | ICD-10-CM

## 2014-03-26 DIAGNOSIS — M23303 Other meniscus derangements, unspecified medial meniscus, right knee: Secondary | ICD-10-CM

## 2014-03-26 DIAGNOSIS — M129 Arthropathy, unspecified: Secondary | ICD-10-CM

## 2014-03-26 DIAGNOSIS — M5416 Radiculopathy, lumbar region: Secondary | ICD-10-CM

## 2014-03-26 MED ORDER — GABAPENTIN 100 MG PO CAPS: 100.0000 mg | ORAL_CAPSULE | Freq: Three times a day (TID) | ORAL | Status: DC

## 2014-03-26 NOTE — Patient Instructions (Signed)
Take Gabapentin 100 mg at bedtime Wear stockings Wear brace Joint Injection  Care After  Refer to this sheet in the next few days. These instructions provide you with information on caring for yourself after you have had a joint injection. Your caregiver also may give you more specific instructions. Your treatment has been planned according to current medical practices, but problems sometimes occur. Call your caregiver if you have any problems or questions after your procedure.  After any type of joint injection, it is not uncommon to experience:  Soreness, swelling, or bruising around the injection site.  Mild numbness, tingling, or weakness around the injection site caused by the numbing medicine used before or with the injection. It also is possible to experience the following effects associated with the specific agent after injection:  Iodine-based contrast agents:  Allergic reaction (itching, hives, widespread redness, and swelling beyond the injection site).  Corticosteroids (These effects are rare.):  Allergic reaction.  Increased blood sugar levels (If you have diabetes and you notice that your blood sugar levels have increased, notify your caregiver).  Increased blood pressure levels.  Mood swings.  Hyaluronic acid in the use of viscosupplementation.  Temporary heat or redness.  Temporary rash and itching.  Increased fluid accumulation in the injected joint. These effects all should resolve within a day after your procedure.  HOME CARE INSTRUCTIONS  Limit yourself to light activity the day of your procedure. Avoid lifting heavy objects, bending, stooping, or twisting.  Take prescription or over-the-counter pain medication as directed by your caregiver.  You may apply ice to your injection site to reduce pain and swelling the day of your procedure. Ice may be applied 3-4 times:  Put ice in a plastic bag.  Place a towel between your skin and the bag.  Leave the ice on for no longer  than 15-20 minutes each time. SEEK IMMEDIATE MEDICAL CARE IF:  Pain and swelling get worse rather than better or extend beyond the injection site.  Numbness does not go away.  Blood or fluid continues to leak from the injection site.  You have chest pain.  You have swelling of your face or tongue.  You have trouble breathing or you become dizzy.  You develop a fever, chills, or severe tenderness at the injection site that last longer than 1 day. MAKE SURE YOU:  Understand these instructions.  Watch your condition.  Get help right away if you are not doing well or if you get worse. Document Released: 10/20/2010 Document Revised: 05/01/2011 Document Reviewed: 10/20/2010  [facility] Patient Information 2014 [place].

## 2014-03-26 NOTE — Progress Notes (Signed)
Chief Complaint  Patient presents with  . Follow-up    recurring right knee pain   Previously evaluated for right knee pain given injection comes back says the pain never really stopped. She complains of 3 things today  #1 pain behind the right knee radiating up into the right thigh and lateral hip  Anteromedial knee pain  Ankle swelling  Ankle edema has been chronic she wears compression stockings doing a good job  Anteromedial knee pain is associated with no catching locking or giving way  Pain on the side of leg back of the leg and hip is associated with back pain  Examination reveals tenderness in the lower back and right SI joint left SI joint nontender. No increased muscle tension skin was normal.  Right hip range of motion full without pain.  Right knee medial joint line tenderness positive McMurray sign. Knee range of motion are of flexion is 125. She has bilateral ankle edema. Overall motor exam in both quadriceps normal skin changes chronic nothing significant. Sensation remains intact  She  is awake alert and oriented 3 mood and affect are normal vital signs are stable BP 120/73 mmHg  Ht 5' (1.524 m)  Wt 161 lb (73.029 kg)  BMI 31.44 kg/m2  Gen. appearance is normal The patient is alert and oriented person place and time Mood is normal affect is normal Ambulatory status normal   Imaging today shows x-ray of the right knee which shows increased arthritis of the medial compartment  Our plan is to start gabapentin 100 mg every night to address her radicular pain  We'll inject the right knee from a medial approach at a knee brace. She can continue stockings for ankles and follow-up with me in a month.

## 2014-03-31 ENCOUNTER — Telehealth: Payer: Self-pay | Admitting: *Deleted

## 2014-03-31 NOTE — Telephone Encounter (Signed)
Patient wants to confirm that it is safe to take the gabapentin dr Aline Brochure prescribed last week with the oxcarbazepine that she is taking

## 2014-03-31 NOTE — Telephone Encounter (Signed)
yes

## 2014-03-31 NOTE — Telephone Encounter (Signed)
Patient aware.

## 2014-04-28 ENCOUNTER — Ambulatory Visit (INDEPENDENT_AMBULATORY_CARE_PROVIDER_SITE_OTHER): Payer: Medicare PPO | Admitting: Orthopedic Surgery

## 2014-04-28 ENCOUNTER — Encounter: Payer: Self-pay | Admitting: Orthopedic Surgery

## 2014-04-28 VITALS — BP 129/64 | Ht 60.0 in | Wt 161.0 lb

## 2014-04-28 DIAGNOSIS — M5416 Radiculopathy, lumbar region: Secondary | ICD-10-CM

## 2014-04-28 DIAGNOSIS — M129 Arthropathy, unspecified: Secondary | ICD-10-CM

## 2014-04-28 DIAGNOSIS — IMO0001 Reserved for inherently not codable concepts without codable children: Secondary | ICD-10-CM

## 2014-04-28 DIAGNOSIS — M1711 Unilateral primary osteoarthritis, right knee: Secondary | ICD-10-CM

## 2014-04-28 MED ORDER — GABAPENTIN 100 MG PO CAPS: 200.0000 mg | ORAL_CAPSULE | Freq: Every day | ORAL | Status: DC

## 2014-04-28 NOTE — Patient Instructions (Signed)
New instructions for Gabapentin 200mg  at bedtime

## 2014-04-28 NOTE — Progress Notes (Signed)
Established patient follow-up pain not improved   Chief Complaint  Patient presents with  . Follow-up    1 month follow up Right knee s/p injection     Sofar we've treated her patient with gabapentin 100 mg at night to address her radicular leg pain which is still present on the antral lateral aspect of her leg and radiating into her foot. She also has some occasional right hip pain which is actually back pain.  She also has pain in her knee without catching locking or giving way   the injection and bracing of her knee did not help.  She comes in for recheck  Review of systems as stated related to the neurologic system  She denies fever chills or warmth or redness around the joint of the right knee   Examination well-developed well-nourished female oriented 3 mood affect normal gait unremarkable  She has tenderness in the anterolateral compartment of her right lower leg related to her radicular symptoms.  Her knee range of motion is surprisingly good she has some mild medial joint line tenderness her knee remains stable motor exams intact skin looks good pulses are normal and she has no sensory loss  Recommend increasing gabapentin to 200 mg at night to address her radicular symptoms  We discussed possibility of needing to replace her knee and if we do that we need to do it by June or July because she has a wedding to go to an October and she also has a grandchild due in September  Encounter Diagnoses  Name Primary?  . Radicular pain of right lower back Yes  . Arthritis of right knee     Follow-up in June

## 2014-05-04 ENCOUNTER — Telehealth: Payer: Self-pay | Admitting: Orthopedic Surgery

## 2014-05-04 NOTE — Telephone Encounter (Signed)
Relayed to patient.  She will contact primary care physician or go to Emergency Room.

## 2014-05-04 NOTE — Telephone Encounter (Signed)
No  You know we dont give back injections ?????

## 2014-05-04 NOTE — Telephone Encounter (Signed)
Patient called today, 05/04/14 (had been seen last week, 04/28/14) requesting an injection for a "catch" in her back that she states occurred today; states she is in [place] today; Possible to have an injection for this?  Patient's cell ph# is [phone]

## 2014-05-14 ENCOUNTER — Encounter: Payer: Self-pay | Admitting: Internal Medicine

## 2014-05-14 ENCOUNTER — Other Ambulatory Visit: Payer: Self-pay

## 2014-05-14 MED ORDER — HYOSCYAMINE SULFATE 0.125 MG SL SUBL: 0.1250 mg | SUBLINGUAL_TABLET | Freq: Three times a day (TID) | SUBLINGUAL | Status: DC

## 2014-05-14 NOTE — Progress Notes (Signed)
MADE APPOINTMENT AND PATIENT AWARE OF DATE AND TIME

## 2014-05-14 NOTE — Progress Notes (Signed)
Patient ID: Shari Vincent, female   DOB: 03/19/1931, 81 y.o.   MRN: [id number] Patient's husband came to see me today and asked if patient's wife's Levsin can be refilled once. We haven't seen her in 5 years. Apparently Levsin was given through [facility] in 2013. He did produce an old prescription bottle with my name on it.  Patient apparently has IBS. I will allow 1 prescription refill Levsin 0.125 mg dispense #30 one sublingually before meals and at bedtime as needed. No refills. Offer followup visit with [place] between now and the end of summer.  Please call prescription into [place].

## 2014-05-14 NOTE — Progress Notes (Signed)
rx has been sent to the pharmacy.  Please schedule ov.

## 2014-05-24 ENCOUNTER — Emergency Department (HOSPITAL_COMMUNITY)
Admission: EM | Admit: 2014-05-24 | Discharge: 2014-05-24 | Disposition: A | Discharge status: Home / Self Care | Payer: Medicare PPO | Attending: Emergency Medicine | Admitting: Emergency Medicine

## 2014-05-24 ENCOUNTER — Encounter (HOSPITAL_COMMUNITY): Payer: Self-pay | Admitting: Cardiology

## 2014-05-24 ENCOUNTER — Emergency Department (HOSPITAL_COMMUNITY): Payer: Medicare PPO

## 2014-05-24 DIAGNOSIS — K219 Gastro-esophageal reflux disease without esophagitis: Secondary | ICD-10-CM | POA: Insufficient documentation

## 2014-05-24 DIAGNOSIS — Z87442 Personal history of urinary calculi: Secondary | ICD-10-CM | POA: Diagnosis not present

## 2014-05-24 DIAGNOSIS — Z79899 Other long term (current) drug therapy: Secondary | ICD-10-CM | POA: Insufficient documentation

## 2014-05-24 DIAGNOSIS — M545 Low back pain: Secondary | ICD-10-CM | POA: Diagnosis present

## 2014-05-24 DIAGNOSIS — Z8639 Personal history of other endocrine, nutritional and metabolic disease: Secondary | ICD-10-CM | POA: Diagnosis not present

## 2014-05-24 DIAGNOSIS — Z859 Personal history of malignant neoplasm, unspecified: Secondary | ICD-10-CM | POA: Diagnosis not present

## 2014-05-24 DIAGNOSIS — M5441 Lumbago with sciatica, right side: Secondary | ICD-10-CM | POA: Insufficient documentation

## 2014-05-24 HISTORY — DX: Calculus of kidney: N20.0

## 2014-05-24 MED ORDER — KETOROLAC TROMETHAMINE 60 MG/2ML IM SOLN
60.0000 mg | Freq: Once | INTRAMUSCULAR | Status: AC
  Administered 2014-05-24: 60 mg via INTRAMUSCULAR
  Filled 2014-05-24: qty 2

## 2014-05-24 NOTE — Discharge Instructions (Signed)
X-rays of your lower back and pelvis area were normal. Recommend 2 Tylenol tablets 3 times a day. Follow-up with Dr. Aline Brochure for further pain.

## 2014-05-24 NOTE — ED Notes (Signed)
Pt states that her pain started in 2015 in her right knee and has progressively worsened over time, spreading up into her right lower and middle back.  It originally started as joint pain that felt like an ache but now is almost exclusively severe aching pain that she rates as 10/10.  She has treated it at home with gabapentin, Icy Hot patches, and ibuprofen with minimal relief.  Her back pain is worse when moving, especially when standing up or sitting down and bending at the hips.

## 2014-05-24 NOTE — ED Notes (Signed)
Back pain off and on since jan 15.  Flared up for a week.

## 2014-05-24 NOTE — ED Provider Notes (Signed)
CSN: [id number]     Arrival date & time 05/24/14  1024 History  This chart was scribed for Nat Christen, MD by [facility], ED Scribe. The patient was seen in APA06/AP[facility]06 and the patient's care was started at 12:10 PM.  Chief Complaint  Patient presents with  . Back Pain   Patient is a 81 y.o. female presenting with back pain. The history is provided by the patient and a relative. No language interpreter was used.  Back Pain   HPI Comments: Shari Vincent is a 81 y.o. female who presents to the Emergency Department complaining of chronic right lower lumbar and sacral pain gradually worsening over the past two months. Pt states sitting down and standing worsen the pain. Walking does not worsen the pain. Pt was taking gabapentin, ibuprofen and Advil but they give her nausea due to her IBS. She denies trauma. No fall.  Past Medical History  Diagnosis Date  . High cholesterol   . Acid reflux   . Cancer   . Kidney stone    Past Surgical History  Procedure Laterality Date  . Foot surgery    . Uterus tacking     Family History  Problem Relation Age of Onset  . Diabetes Mother   . Stroke Father    History  Substance Use Topics  . Smoking status: Never Smoker   . Smokeless tobacco: Not on file  . Alcohol Use: No   OB History    No data available     Review of Systems  Musculoskeletal: Positive for back pain, arthralgias and gait problem.  A complete 10 system review of systems was obtained and all systems are negative except as noted in the HPI and PMH.  Allergies  Albuterol and Cefdinir  Home Medications   Prior to Admission medications   Medication Sig Start Date End Date Taking? Authorizing Provider  Cyanocobalamin (VITAMIN B-12 CR PO) Take 1 tablet by mouth daily.    Yes Historical Provider, MD  meclizine (ANTIVERT) 25 MG tablet Take 25 mg by mouth 3 (three) times daily as needed for dizziness.   Yes Historical Provider, MD  Multiple Vitamins-Minerals (CENTRUM SILVER  PO) Take 1 tablet by mouth daily.    Yes Historical Provider, MD  omeprazole (PRILOSEC) 20 MG capsule Take 20 mg by mouth daily.     Yes Historical Provider, MD  OXcarbazepine (TRILEPTAL) 150 MG tablet Take 75-150 mg by mouth 2 (two) times daily. 1/2 in morning, 1 at bedtime   Yes Historical Provider, MD  gabapentin (NEURONTIN) 100 MG capsule Take 2 capsules (200 mg total) by mouth at bedtime. Patient not taking: Reported on 05/24/2014 04/28/14   Carole Civil, MD  hyoscyamine (LEVSIN SL) 0.125 MG SL tablet Place 1 tablet (0.125 mg total) under the tongue 4 (four) times daily -  before meals and at bedtime. prn 05/14/14   Daneil Dolin, MD   Pulse 70  Temp(Src) 97.7 F (36.5 C) (Oral)  Resp 22  Ht 5' (1.524 m)  Wt 161 lb (73.029 kg)  BMI 31.44 kg/m2  SpO2 100% Physical Exam  Constitutional: She is oriented to person, place, and time. She appears well-developed and well-nourished.  HENT:  Head: Normocephalic and atraumatic.  Eyes: Conjunctivae and EOM are normal. Pupils are equal, round, and reactive to light.  Neck: Normal range of motion. Neck supple.  Cardiovascular: Normal rate and regular rhythm.   Pulmonary/Chest: Effort normal and breath sounds normal.  Abdominal: Soft. Bowel sounds  are normal.  Musculoskeletal: Normal range of motion.  Tenderness over the right lower lumbar and sacral area.  Negative straight leg raise.  Neurological: She is alert and oriented to person, place, and time.  Skin: Skin is warm and dry.  Psychiatric: She has a normal mood and affect. Her behavior is normal.  Nursing note and vitals reviewed.   ED Course  Procedures  DIAGNOSTIC STUDIES: Oxygen Saturation is 100% on room air, normal by my interpretation.    COORDINATION OF CARE: 12:19 PM: Will X-ray her lower back bone and pelvic area as well as a shot of Toradol for the pain. Pt agreed to plan.  Labs Review Labs Reviewed - No data to display  Imaging Review Dg Sacrum/coccyx  05/24/2014    CLINICAL DATA:  Low back pain  EXAM: SACRUM AND COCCYX - 2+ VIEW  COMPARISON:  None.  FINDINGS: There is no evidence of fracture identified. A bone island is noted within the sacrum on the left.  IMPRESSION: No acute abnormality seen.   Electronically Signed   By: Inez Catalina M.D.   On: 05/24/2014 14:09     EKG Interpretation None      MDM   Final diagnoses:  Right-sided low back pain with right-sided sciatica   Plain films of sacrum and coccyx show no fracture. I am concerned about opiates and nonsteroidals with this patient. Will recommend Tylenol only. Follow-up with Dr. Ples Specter.  I personally performed the services described in this documentation, which was scribed in my presence. The recorded information has been reviewed and is accurate.      Nat Christen, MD 05/24/14 [phone]

## 2014-05-26 ENCOUNTER — Encounter: Payer: Self-pay | Admitting: Primary Care

## 2014-05-26 ENCOUNTER — Ambulatory Visit (INDEPENDENT_AMBULATORY_CARE_PROVIDER_SITE_OTHER): Payer: Medicare PPO | Admitting: Primary Care

## 2014-05-26 ENCOUNTER — Telehealth: Payer: Self-pay | Admitting: Primary Care

## 2014-05-26 VITALS — BP 126/80 | HR 62 | Temp 98.0°F | Ht 60.0 in | Wt 162.0 lb

## 2014-05-26 DIAGNOSIS — K219 Gastro-esophageal reflux disease without esophagitis: Secondary | ICD-10-CM

## 2014-05-26 DIAGNOSIS — R42 Dizziness and giddiness: Secondary | ICD-10-CM | POA: Diagnosis not present

## 2014-05-26 DIAGNOSIS — E785 Hyperlipidemia, unspecified: Secondary | ICD-10-CM

## 2014-05-26 DIAGNOSIS — G500 Trigeminal neuralgia: Secondary | ICD-10-CM

## 2014-05-26 DIAGNOSIS — M5441 Lumbago with sciatica, right side: Secondary | ICD-10-CM

## 2014-05-26 DIAGNOSIS — K589 Irritable bowel syndrome without diarrhea: Secondary | ICD-10-CM | POA: Insufficient documentation

## 2014-05-26 MED ORDER — TIZANIDINE HCL 2 MG PO TABS: 2.0000 mg | ORAL_TABLET | Freq: Every day | ORAL | Status: DC

## 2014-05-26 NOTE — Assessment & Plan Note (Signed)
Stable without reoccurring symptoms. Diagnosed 1-2 years ago. Follows with Dr. Merlene Laughter (Neuro) in [place] and is taking Trileptal. Next appointment should be in 1-2 months.

## 2014-05-26 NOTE — Telephone Encounter (Signed)
Called and left voicemail for patient.

## 2014-05-26 NOTE — Telephone Encounter (Signed)
Will you please notify Shari Vincent or her son that I have ordered an MRI of her lower back and that she should be contacted soon for scheduling? The metal in her foot should not be a problem for the scan, I spoke with the radiologist myself.  Thank you!

## 2014-05-26 NOTE — Assessment & Plan Note (Signed)
Will obtain old records. Follows a healthy diet. Will re-check during upcoming fasting physical.

## 2014-05-26 NOTE — Progress Notes (Signed)
Subjective:    Patient ID: Shari Vincent, female    DOB: [birth year], 81 y.o.   MRN: [id number]  HPI  Shari Vincent is an 81 year old female who presents today to establish care and discuss the problems mentioned below. Will obtain old records.  1) GERD: Diagnosed 20+ years ago and has been taking omeprazole without complications.  2) Trigeminal neuralgia: Diagnosed about 1-2 years ago. Follows with Dr. Merlene Laughter Neurologist in [place] and is taking trileptal. She cancelled her appointment today and  is to re-schedule an appointment with him in the next month.  3) Nerve pain: Follow with Dr. Aline Brochure with [facility] since June of 2015 due to painful right knee with intermittent burning sensation. Was prescribed gabapentin but has not taken this for one week due to GI upset.   4) IBS: Diarrhea type. Follows with Dr. Gala Romney with GI in [place]. Next appointment is in May. She is taking digestive advantace vitamins which help prevent diarrhea. Last episode of diarrhea was several weeks ago. Denies constipation, bloody stools.  5) Hyperlipidemia: Has a history of elevated levels, but is unsure of exact results. Diet consists of chicken, fruits, some vegetables. Rarely eats out. Enjoys baking cakes and pies, does not eat many sweets. Some sweet tea and water.  6) Back pain: Right lower back pain with pain and occasional "burning sensation" radiating down to right calf. She has difficulty raising and lowering herself to the toilet as she will experience pain. Initially started less than one year ago and has been treated for pain and burning to right knee for the past year. She presented to the ER at [facility] with complaints of right lower back pain. She had a xray of her sacrum and coccyx which was unremarkable for fracture or any abnormalities. She was provided with a shot of Toradol which helped relieve her pain. She's been managing her pain at home with tylenol every four  hours. This will help to reduce her pain. The pain is worse when she sits or lays down for a prolonged amount of time.   Review of Systems  Constitutional: Negative for fatigue and unexpected weight change.  HENT: Negative for rhinorrhea.   Respiratory: Negative for cough and shortness of breath.   Cardiovascular: Negative for chest pain.  Gastrointestinal: Positive for diarrhea. Negative for nausea, vomiting, constipation and blood in stool.  Genitourinary: Negative for dysuria and difficulty urinating.  Musculoskeletal: Positive for back pain and arthralgias.  Skin: Negative for rash.  Neurological: Negative for dizziness and headaches.  Psychiatric/Behavioral:       Denies concerns for anxiety or depression.       Past Medical History  Diagnosis Date  . High cholesterol   . Acid reflux   . Cancer     Basal Cell on right shoulder  . Kidney stone   . Vertigo     History   Social History  . Marital Status: Married    Spouse Name: N/A  . Number of Children: N/A  . Years of Education: N/A   Occupational History  . Not on file.   Social History Main Topics  . Smoking status: Never Smoker   . Smokeless tobacco: Not on file  . Alcohol Use: No  . Drug Use: No  . Sexual Activity: Not on file   Other Topics Concern  . Not on file   Social History Narrative   Married 62 years.   Has three children, three grandchildren, one great  grandchildren.   Enjoys going to church, baking, shop.    Past Surgical History  Procedure Laterality Date  . Foot surgery    . Uterus tacking      Family History  Problem Relation Age of Onset  . Diabetes Mother   . Stroke Father   . Heart disease Mother     MI  . Uterine cancer Mother     Allergies  Allergen Reactions  . Albuterol Rash  . Cefdinir     Current Outpatient Prescriptions on File Prior to Visit  Medication Sig Dispense Refill  . Cyanocobalamin (VITAMIN B-12 CR PO) Take 1 tablet by mouth daily.     Marland Kitchen  gabapentin (NEURONTIN) 100 MG capsule Take 2 capsules (200 mg total) by mouth at bedtime. 60 capsule 0  . hyoscyamine (LEVSIN SL) 0.125 MG SL tablet Place 1 tablet (0.125 mg total) under the tongue 4 (four) times daily -  before meals and at bedtime. prn 30 tablet 0  . meclizine (ANTIVERT) 25 MG tablet Take 25 mg by mouth 3 (three) times daily as needed for dizziness.    . Multiple Vitamins-Minerals (CENTRUM SILVER PO) Take 1 tablet by mouth daily.     Marland Kitchen omeprazole (PRILOSEC) 20 MG capsule Take 20 mg by mouth daily.      . OXcarbazepine (TRILEPTAL) 150 MG tablet Take 75-150 mg by mouth 2 (two) times daily. 1/2 in morning, 1 at bedtime     No current facility-administered medications on file prior to visit.    BP 126/80 mmHg  Pulse 62  Temp(Src) 98 F (36.7 C) (Oral)  Ht 5' (1.524 m)  Wt 162 lb (73.483 kg)  BMI 31.64 kg/m2  SpO2 98%    Objective:   Physical Exam  Constitutional: She is oriented to person, place, and time. She appears well-developed.  HENT:  Head: Normocephalic.  Right Ear: External ear normal.  Left Ear: External ear normal.  Nose: Nose normal.  Mouth/Throat: Oropharynx is clear and moist.  Eyes: Conjunctivae and EOM are normal. Pupils are equal, round, and reactive to light.  Neck: Neck supple. No thyromegaly present.  Cardiovascular: Normal rate and regular rhythm.   Pulmonary/Chest: Effort normal and breath sounds normal.  Abdominal: Soft. Bowel sounds are normal. There is no tenderness.  Musculoskeletal:       Lumbar back: She exhibits decreased range of motion, tenderness and pain. She exhibits no deformity.  Negative straight leg raise.  Lymphadenopathy:    She has no cervical adenopathy.  Neurological: She is alert and oriented to person, place, and time. She has normal reflexes. No cranial nerve deficit. Coordination normal.  Skin: Skin is warm and dry.  Psychiatric: She has a normal mood and affect.          Assessment & Plan:

## 2014-05-26 NOTE — Patient Instructions (Signed)
I will contact you regarding the MRI/CT scan of your back. I've sent a muscle relaxer to your pharmacy to help with spasms and pain. Take one tablet by mouth a bedtime as needed. Continue tylenol. Keep your follow up appointment with your current providers. Schedule a fasting physical with me 3-4 weeks. We will also follow up on your back pain as well. Please don't hesitate to call me with any questions! Welcome to [facility]!

## 2014-05-26 NOTE — Progress Notes (Signed)
Pre visit review using our clinic review tool, if applicable. No additional management support is needed unless otherwise documented below in the visit note. 

## 2014-05-26 NOTE — Telephone Encounter (Signed)
Called and spoken to patient's son Glendell Docker). Notified him of Kate's comments. He verbalized understanding.

## 2014-05-26 NOTE — Assessment & Plan Note (Signed)
Stable.  Diarrhea type.  Follows with Dr. Gala Romney GI in [place]. Next appointment is in May 2016.

## 2014-05-26 NOTE — Assessment & Plan Note (Signed)
Managed well on daily omeprazole. Denies cough.

## 2014-05-26 NOTE — Assessment & Plan Note (Signed)
Present for 6 months. Has a history of OA and is following with Dr. Aline Brochure in [place]. She reports she may need a right knee replacement. Xrays are negative for fractures or other abnormalities. Continue tylenol for pain, not to exceed 3000mg  daily. Tizanidine 2mg  QHS. MRI ordered for further evaluation of lumbar spine.

## 2014-05-26 NOTE — Assessment & Plan Note (Signed)
Stable. Infrequent episodes, about once every few months. Will take Meclizine as needed.

## 2014-05-27 ENCOUNTER — Encounter: Payer: Self-pay | Admitting: Primary Care

## 2014-05-28 NOTE — Telephone Encounter (Signed)
Pat returned your call  [phone]

## 2014-05-28 NOTE — Telephone Encounter (Signed)
Called patient back. Notified her of Kate's comments. Patient verbalized understanding.

## 2014-05-28 NOTE — Telephone Encounter (Signed)
Pt returned   [phone]

## 2014-06-10 ENCOUNTER — Ambulatory Visit (HOSPITAL_COMMUNITY)
Admission: RE | Admit: 2014-06-10 | Discharge: 2014-06-10 | Disposition: A | Discharge status: Home / Self Care | Payer: Medicare PPO | Source: Ambulatory Visit | Attending: Primary Care | Admitting: Primary Care

## 2014-06-10 DIAGNOSIS — M5441 Lumbago with sciatica, right side: Secondary | ICD-10-CM | POA: Insufficient documentation

## 2014-06-11 ENCOUNTER — Telehealth: Payer: Self-pay | Admitting: Primary Care

## 2014-06-11 ENCOUNTER — Encounter: Payer: Self-pay | Admitting: Primary Care

## 2014-06-11 DIAGNOSIS — M5441 Lumbago with sciatica, right side: Secondary | ICD-10-CM

## 2014-06-11 NOTE — Telephone Encounter (Signed)
Spoke with Glendell Docker, patient's son, regarding Shari Vincent's MRI. Both Ms. Allmendinger and Glendell Docker are interested in a referral to orthopedics for more information regarding her MRI. Referral made.

## 2014-06-11 NOTE — Telephone Encounter (Signed)
I spoke with Shari Vincent this afternoon and notified her of her MRI results and presented a few options. She would like to discuss this with her husband and sons. She will have her son, Shari Vincent, contact me later to discuss in more detail.

## 2014-06-15 ENCOUNTER — Encounter: Payer: Self-pay | Admitting: Primary Care

## 2014-06-16 ENCOUNTER — Other Ambulatory Visit: Payer: Self-pay | Admitting: Primary Care

## 2014-06-16 DIAGNOSIS — M5441 Lumbago with sciatica, right side: Secondary | ICD-10-CM

## 2014-06-17 ENCOUNTER — Encounter: Payer: Self-pay | Admitting: Primary Care

## 2014-06-17 ENCOUNTER — Ambulatory Visit (INDEPENDENT_AMBULATORY_CARE_PROVIDER_SITE_OTHER): Payer: Medicare PPO | Admitting: Primary Care

## 2014-06-17 VITALS — BP 128/82 | HR 60 | Temp 97.2°F | Ht 60.0 in | Wt 161.8 lb

## 2014-06-17 DIAGNOSIS — M5441 Lumbago with sciatica, right side: Secondary | ICD-10-CM

## 2014-06-17 DIAGNOSIS — Z0000 Encounter for general adult medical examination without abnormal findings: Secondary | ICD-10-CM | POA: Insufficient documentation

## 2014-06-17 DIAGNOSIS — R7989 Other specified abnormal findings of blood chemistry: Secondary | ICD-10-CM

## 2014-06-17 LAB — CBC WITH DIFFERENTIAL/PLATELET
Basophils Absolute: 0 10*3/uL (ref 0.0–0.1)
Basophils Relative: 0.6 % (ref 0.0–3.0)
Eosinophils Absolute: 0.1 10*3/uL (ref 0.0–0.7)
Eosinophils Relative: 1.8 % (ref 0.0–5.0)
HCT: 41.8 % (ref 36.0–46.0)
Hemoglobin: 13.9 g/dL (ref 12.0–15.0)
Lymphocytes Relative: 40.2 % (ref 12.0–46.0)
Lymphs Abs: 2.8 10*3/uL (ref 0.7–4.0)
MCHC: 33.3 g/dL (ref 30.0–36.0)
MCV: 89 fl (ref 78.0–100.0)
Monocytes Absolute: 0.7 10*3/uL (ref 0.1–1.0)
Monocytes Relative: 9.3 % (ref 3.0–12.0)
Neutro Abs: 3.4 10*3/uL (ref 1.4–7.7)
Neutrophils Relative %: 48.1 % (ref 43.0–77.0)
Platelets: 182 10*3/uL (ref 150.0–400.0)
RBC: 4.7 Mil/uL (ref 3.87–5.11)
RDW: 14.4 % (ref 11.5–15.5)
WBC: 7 10*3/uL (ref 4.0–10.5)

## 2014-06-17 LAB — COMPREHENSIVE METABOLIC PANEL
ALT: 18 U/L (ref 0–35)
AST: 5 U/L (ref 0–37)
Albumin: 4.4 g/dL (ref 3.5–5.2)
Alkaline Phosphatase: 63 U/L (ref 39–117)
BUN: 13 mg/dL (ref 6–23)
CO2: 29 mEq/L (ref 19–32)
Calcium: 9.8 mg/dL (ref 8.4–10.5)
Chloride: 104 mEq/L (ref 96–112)
Creatinine, Ser: 0.84 mg/dL (ref 0.40–1.20)
GFR: 69.13 mL/min (ref >60.00)
Glucose, Bld: 84 mg/dL (ref 70–99)
Potassium: 4.1 mEq/L (ref 3.5–5.1)
Sodium: 138 mEq/L (ref 135–145)
Total Bilirubin: 0.5 mg/dL (ref 0.2–1.2)
Total Protein: 7.2 g/dL (ref 6.0–8.3)

## 2014-06-17 LAB — LIPID PANEL
Cholesterol: 265 mg/dL — ABNORMAL HIGH (ref 0–200)
HDL: 35.5 mg/dL — ABNORMAL LOW (ref >39.00)
NonHDL: 229.5
Total CHOL/HDL Ratio: 7
Triglycerides: 385 mg/dL — ABNORMAL HIGH (ref 0.0–149.0)
VLDL: 77 mg/dL — ABNORMAL HIGH (ref 0.0–40.0)

## 2014-06-17 LAB — HEMOGLOBIN A1C: Hgb A1c MFr Bld: 6 % (ref 4.6–6.5)

## 2014-06-17 LAB — LDL CHOLESTEROL, DIRECT: Direct LDL: 61 mg/dL

## 2014-06-17 LAB — TSH: TSH: 1.14 u[IU]/mL (ref 0.35–4.50)

## 2014-06-17 MED ORDER — TIZANIDINE HCL 2 MG PO TABS: 2.0000 mg | ORAL_TABLET | Freq: Every day | ORAL | Status: DC

## 2014-06-17 NOTE — Progress Notes (Signed)
Patient ID: Shari Vincent, female   DOB: [birth year], 81 y.o.   MRN: [id number]  HPI:  Shari Vincent is an 81 year old female who presents today for her Medicare Annual Wellness Exam.  Past Medical History  Diagnosis Date  . High cholesterol   . Acid reflux   . Cancer     Basal Cell on right shoulder  . Kidney stone   . Vertigo   . Diverticulitis   . Colon polyps     Current Outpatient Prescriptions  Medication Sig Dispense Refill  . acetaminophen (TYLENOL) 325 MG tablet Take 650 mg by mouth every 4 (four) hours as needed.     . Cyanocobalamin (VITAMIN B-12 CR PO) Take 1 tablet by mouth daily.     Marland Kitchen gabapentin (NEURONTIN) 100 MG capsule Take 2 capsules (200 mg total) by mouth at bedtime. 60 capsule 0  . hyoscyamine (LEVSIN SL) 0.125 MG SL tablet Place 1 tablet (0.125 mg total) under the tongue 4 (four) times daily -  before meals and at bedtime. prn 30 tablet 0  . meclizine (ANTIVERT) 25 MG tablet Take 25 mg by mouth 3 (three) times daily as needed for dizziness.    . Multiple Vitamins-Minerals (CENTRUM SILVER PO) Take 1 tablet by mouth daily.     Marland Kitchen omeprazole (PRILOSEC) 20 MG capsule Take 20 mg by mouth daily.      . OXcarbazepine (TRILEPTAL) 150 MG tablet Take 75-150 mg by mouth 2 (two) times daily. 1/2 in morning, 1 at bedtime    . tiZANidine (ZANAFLEX) 2 MG tablet Take 1 tablet (2 mg total) by mouth at bedtime. 30 tablet 0   No current facility-administered medications for this visit.    Allergies  Allergen Reactions  . Albuterol Rash  . Cefdinir     Family History  Problem Relation Age of Onset  . Diabetes Mother   . Stroke Father   . Heart disease Mother     MI  . Uterine cancer Mother     History   Social History  . Marital Status: Married    Spouse Name: N/A  . Number of Children: N/A  . Years of Education: N/A   Occupational History  . Not on file.   Social History Main Topics  . Smoking status: Never Smoker   . Smokeless tobacco: Not on file  .  Alcohol Use: No  . Drug Use: No  . Sexual Activity: Not on file   Other Topics Concern  . Not on file   Social History Narrative   Married 62 years.   Has three children, three grandchildren, one great grandchildren.   Enjoys going to church, baking, shop.    Hospitiliaztions: Emergency department visit for back pain. No hospital admittance.   Health Maintenance:    Flu: Did not get flu shot last year and has not had it since 1964 due to personal reasons.  Tetanus: Completed in 2014  Pneumovax: Completed  Zostavax: Completed  Bone Density: Last one completed about 5 years ago. Due.  Colonoscopy: Last one completed in 2010.  Eye Doctor: Glaucoma screening completed in August 2015. Next appointment summer of 2016.  Dental Exam: Appointment is scheduled for next week.  Mammogram: Last completed in January 2016.    I have personally reviewed and have noted: 1. The patient's medical and social history 2. Their use of alcohol, tobacco or illicit drugs 3. Their current medications and supplements 4. The patient's functional ability including ADL's, fall risks, home  safety risks and hearing or visual  impairment. 5. Diet and physical activities 6. Evidence for depression or mood disorder  Subjective:   Review of Systems:   Constitutional: Denies fever, malaise, fatigue, headache or abrupt weight changes.  HEENT: Denies eye pain, eye redness, ear pain, some ringing in the ears (history of BPPV), wax buildup, runny nose, nasal congestion, bloody nose, or sore throat. Respiratory: Denies difficulty breathing, shortness of breath, cough or sputum production.   Cardiovascular: Denies chest pain, chest tightness, palpitations or swelling in the hands or feet.  Gastrointestinal: History of IBS, has had some pain and bloating recently; has an appointment with GI in May 2016. Denies constipation, diarrhea or blood in the stool.  GU: Reports urgency and some urinary incontinence which is  no change from prior. Denies pain with urination, burning sensation, blood in urine, odor or discharge. Musculoskeletal: Chronic right knee and lower back pain. Managed with medications. Neurosurgical consult soon for bulging discs found on MRI. Skin: Denies redness, rashes, lesions or ulcercations.  Neurological: Denies dizziness, difficulty with memory, difficulty with speech or problems with balance and coordination.   No other specific complaints in a complete review of systems (except as listed in HPI above).  Objective:  PE:   BP 128/82 mmHg  Pulse 60  Temp(Src) 97.2 F (36.2 C) (Oral)  Ht 5' (1.524 m)  Wt 161 lb 12.8 oz (73.392 kg)  BMI 31.60 kg/m2  SpO2 97% Wt Readings from Last 3 Encounters:  06/17/14 161 lb 12.8 oz (73.392 kg)  06/10/14 161 lb (73.029 kg)  05/26/14 162 lb (73.483 kg)    General: Appears their stated age, well developed, well nourished in NAD. Skin: Warm, dry and intact. No rashes, lesions or ulcerations noted. HEENT: Head: normal shape and size; Eyes: sclera white, no icterus, conjunctiva pink, PERRLA and EOMs intact; Ears: Tm's gray and intact, normal light reflex; Nose: mucosa pink and moist, septum midline; Throat/Mouth: Teeth present, mucosa pink and moist, no exudate, lesions or ulcerations noted.  Neck: Normal range of motion. Neck supple, trachea midline. No massses, lumps or thyromegaly present.  Cardiovascular: Normal rate and rhythm. S1,S2 noted.  No murmur, rubs or gallops noted. No JVD or BLE edema. No carotid bruits noted. Pulmonary/Chest: Normal effort and positive vesicular breath sounds. No respiratory distress. No wheezes, rales or ronchi noted.  Abdomen: Soft and nontender. Normal bowel sounds, no bruits noted. No distention or masses noted. Liver, spleen and kidneys non palpable. Musculoskeletal: Decreased range of motion to right knee and lower back. No signs of joint swelling. No difficulty with gait.  Neurological: Alert and oriented.  Cranial nerves II-XII intact. Coordination normal. +DTRs bilaterally. Psychiatric: Mood and affect normal. Behavior is normal. Judgment and thought content normal.   EKG: Completed. Sinus bradycardia at 58. T-wave inversion in V1 and aVL, poor r-wave progression, no ST elevation. She is asymptomatic.  Drawing labs today.  BMET    Component Value Date/Time   NA 141 09/05/2010 0813   K 3.8 09/05/2010 0813   CL 105 09/05/2010 0813   CO2 23 09/05/2010 0813   GLUCOSE 102* 09/05/2010 0813   BUN 13 09/05/2010 0813   CREATININE 0.71 09/05/2010 0813   CALCIUM 10.1 09/05/2010 0813   GFRNONAA >60 09/05/2010 0813   GFRAA >60 09/05/2010 0813    Lipid Panel  No results found for: CHOL, TRIG, HDL, CHOLHDL, VLDL, LDLCALC  CBC    Component Value Date/Time   WBC 6.1 04/20/2008 2208   RBC 4.68  04/20/2008 2208   HGB 13.7 04/20/2008 2208   HCT 41.5 04/20/2008 2208   PLT 204 04/20/2008 2208   MCV 88.7 04/20/2008 2208   MCHC 33.0 04/20/2008 2208   RDW 14.4 04/20/2008 2208   LYMPHSABS 2.6 04/20/2008 2208   MONOABS 0.6 04/20/2008 2208   EOSABS 0.1 04/20/2008 2208   BASOSABS 0.0 04/20/2008 2208    Hgb A1C No results found for: HGBA1C  Current providers: Dr. Aline Brochure: [facility], appointment in June 2016. [facility], appointment in [facility]. Dr. Philbert Riser:  [facility]. Dr. Evorn Gong:  [facility], next appointment July 2016. Dr. Caryl Comes: [facility], last seen in March 2016. [facility]. [name], AGNP-C, primary care provider.   Assessment and Plan:   Medicare Annual Wellness Visit:  Diet: Consists of baked/grilled lean meats, vegetables, fruits. Doesn't eat red meat. She does eat cakes and pies occasionally, some sherbert. Overall healthy. Physical activity: Sedentary, but active within the house. Enjoys working in the flower bed, but not recently due to knee and back pain. Depression/mood screen:  Negative Hearing: Intact to whispered voice Visual acuity: Grossly normal, performs annual eye exam  ADLs: Capable Fall risk: None Home safety: Feels safe at home. Cognitive evaluation: Intact to orientation, naming, recall and repetition EOL planning: Adv directives complete, she is not a full code. Comfort measures only.  Preventative Medicine: Continue healthy diet and activity around the house as tolerated. Complete DEXA scan, recommended annual flu vaccine.  Next appointment: September 2016 with PCP.  Copy of personalized plan for preventative services provided to patient, copy placed in chart to be scanned.

## 2014-06-17 NOTE — Assessment & Plan Note (Signed)
Currently in process to meet with neurosurgery regarding bulging discs found on MRI. Refilled Tizanidine today.

## 2014-06-17 NOTE — Progress Notes (Signed)
Pre visit review using our clinic review tool, if applicable. No additional management support is needed unless otherwise documented below in the visit note. 

## 2014-06-17 NOTE — Assessment & Plan Note (Signed)
Doing quite well. Lives at home with husband, completes ADL's. Advanced directives in place. Reports urinary incontinence: offered treatment with medication and/or referral to urology, she declines at this time. Currently in process to meet with neurosurgery regarding bulging discs found on MRI. Ordered Dexa scan, recommended annual influenza vaccination next season. Labs today: Lipids, CMP, CBC, TSH, A1C.

## 2014-06-17 NOTE — Patient Instructions (Signed)
Complete lab work prior to leaving today. I will notify you of your results. You will be contacted regarding the Dexa Bone Scan. Follow up in 4 months for a 30 minute appointment, or sooner if you need me. It was great to see you today! Take care!

## 2014-06-18 ENCOUNTER — Other Ambulatory Visit: Payer: Self-pay | Admitting: Primary Care

## 2014-06-18 DIAGNOSIS — E785 Hyperlipidemia, unspecified: Secondary | ICD-10-CM

## 2014-06-18 MED ORDER — ATORVASTATIN CALCIUM 10 MG PO TABS: 10.0000 mg | ORAL_TABLET | Freq: Every day | ORAL | Status: DC

## 2014-06-23 ENCOUNTER — Telehealth: Payer: Self-pay | Admitting: Primary Care

## 2014-06-23 ENCOUNTER — Other Ambulatory Visit: Payer: Self-pay | Admitting: Primary Care

## 2014-06-23 DIAGNOSIS — E2839 Other primary ovarian failure: Secondary | ICD-10-CM

## 2014-06-23 NOTE — Telephone Encounter (Signed)
Can you changed her bone density order.  Medicare will not pay for preventive.  Its needs to be estrogen decency or osteopenia ir vetibal fracture

## 2014-06-23 NOTE — Telephone Encounter (Signed)
This has been completed.  Thank you!

## 2014-06-25 NOTE — Telephone Encounter (Signed)
Appointment 5/11  Pt aware

## 2014-07-01 ENCOUNTER — Ambulatory Visit
Admission: RE | Admit: 2014-07-01 | Discharge: 2014-07-01 | Disposition: A | Discharge status: Home / Self Care | Payer: Medicare PPO | Source: Ambulatory Visit | Attending: Primary Care | Admitting: Primary Care

## 2014-07-01 DIAGNOSIS — E2839 Other primary ovarian failure: Secondary | ICD-10-CM

## 2014-07-03 ENCOUNTER — Other Ambulatory Visit: Payer: Self-pay | Admitting: Primary Care

## 2014-07-03 DIAGNOSIS — M810 Age-related osteoporosis without current pathological fracture: Secondary | ICD-10-CM

## 2014-07-03 MED ORDER — ALENDRONATE SODIUM 70 MG PO TABS
ORAL_TABLET | ORAL | Status: DC
Start: 2014-07-03 — End: 2014-07-07

## 2014-07-06 ENCOUNTER — Telehealth: Payer: Self-pay | Admitting: *Deleted

## 2014-07-06 DIAGNOSIS — M810 Age-related osteoporosis without current pathological fracture: Secondary | ICD-10-CM

## 2014-07-06 NOTE — Telephone Encounter (Signed)
Patient left a voicemail stating that she can not take the Fosamax prescribed to her. Patient stated that she has read the information that came with the script and she has stomach problems. Patient stated that she just wanted to let you know this.

## 2014-07-07 NOTE — Telephone Encounter (Signed)
Discussed her concerns regarding the Fosamax. She is taking Calcium with Vitamin D twice daily. She will come in within the next 2 weeks to have her vitamin D level drawn for further evaluation. I explained to her that she was a high risk for fracture due to her low bone density. She verbalized understanding.

## 2014-07-08 ENCOUNTER — Telehealth: Payer: Self-pay | Admitting: Orthopedic Surgery

## 2014-07-08 NOTE — Telephone Encounter (Signed)
Ok to refill 

## 2014-07-08 NOTE — Telephone Encounter (Signed)
Shari Vincent is calling requesting a refill on her gabapentin (NEURONTIN) 100 MG capsule called to [facility] in [place], please advise?

## 2014-07-09 ENCOUNTER — Other Ambulatory Visit: Payer: Self-pay | Admitting: Orthopedic Surgery

## 2014-07-09 DIAGNOSIS — IMO0001 Reserved for inherently not codable concepts without codable children: Secondary | ICD-10-CM

## 2014-07-09 MED ORDER — GABAPENTIN 100 MG PO CAPS: 200.0000 mg | ORAL_CAPSULE | Freq: Every day | ORAL | Status: DC

## 2014-07-14 ENCOUNTER — Ambulatory Visit (INDEPENDENT_AMBULATORY_CARE_PROVIDER_SITE_OTHER): Payer: Medicare PPO | Admitting: Gastroenterology

## 2014-07-14 ENCOUNTER — Encounter: Payer: Self-pay | Admitting: Gastroenterology

## 2014-07-14 VITALS — BP 126/71 | HR 76 | Temp 97.1°F | Ht 60.0 in | Wt 156.0 lb

## 2014-07-14 DIAGNOSIS — K589 Irritable bowel syndrome without diarrhea: Secondary | ICD-10-CM

## 2014-07-14 DIAGNOSIS — K219 Gastro-esophageal reflux disease without esophagitis: Secondary | ICD-10-CM | POA: Diagnosis not present

## 2014-07-14 MED ORDER — RANITIDINE HCL 150 MG PO CAPS: 150.0000 mg | ORAL_CAPSULE | Freq: Two times a day (BID) | ORAL | Status: DC

## 2014-07-14 MED ORDER — HYOSCYAMINE SULFATE 0.125 MG SL SUBL: 0.1250 mg | SUBLINGUAL_TABLET | Freq: Three times a day (TID) | SUBLINGUAL | Status: DC

## 2014-07-14 NOTE — Assessment & Plan Note (Signed)
Chronic GERD with inability to come off PPI do to recurrent symptoms within 24 hours of coming off meds. Osteoporosis on bone density study. Patient has concerns about chronic PPI therapy. Discussed other options, will alternate PPI with H2 blocker to see if tolerated. Discussed possibility of tachyphalaxis with zantac. Consider parenteral treatment for osteoporosis given patient's reluctance for oral therapy due to GI upset. Recommended patient discuss with PCP. OV in six months with Dr. Gala Romney.

## 2014-07-14 NOTE — Patient Instructions (Signed)
Try decreasing omeprazole to 20mg  every other day. Take Zantac 150mg  one to two times daily on the days you don't take omeprazole.   If you have more frequent "attacks" of abdominal pain/bowel movements please let me know. Otherwise, continue hyoscyamine as needed.  Return to the office in six months to see Dr. Gala Romney.

## 2014-07-14 NOTE — Assessment & Plan Note (Signed)
Patient with episodic abdominal cramping associated with BM and presyncopal symptoms. Occur couple of times per year, most recently has had just one episode in last six months. Abdominal pain improves with Levsin. Previous work up EGD/TCS 2010 unremarkable and symptoms have been less frequent since that time. Continue Levsin prn. If symptoms are more progressive then consider CT A/P with IV and oral contrast. Ov in six months with Dr. Gala Romney.

## 2014-07-14 NOTE — Progress Notes (Signed)
Primary Care Physician:  Sheral Flow, NP  Primary Gastroenterologist:  Garfield Cornea, MD   Chief Complaint  Patient presents with  . Follow-up    HPI:  KIMIYA Vincent is a 81 y.o. female here for follow up. Last seen in 2010. H/O bouts of abdominal pain and diarrhea with months in between episodes. EGD/TCS back in 2010 with benign random colon biopsies, hyperplastic rectal polyps, small hh. Has historically taken Levsin at onset of these symptoms with good relief of the abdominal pain.   First "attack" in a long while. Just sitting talking to husband. Stomach cramping like urge to go to the bathroom. Gets hot and feels like going to pass out for she will lay on bathroom floor on a chuck. Will pass mushy brown stool with terrible odor. Happens once. Then feels weak. No vomiting. No blood or melena. Usually at home with it happens. Seems to be more in the evening. The last time she recalls eating fast food couple of hours before. Used to have 3-4 times per year but this is her first episode in over six months.    Digestive advantage two gummies daily. Takes Takes levsin when attack starts. Helps abdominal pain but still has all the other symptoms until she has a bowel movement.   Most days she has small BM. No melena, brbpr. She has chronic GERD on omeprazole OTC daily. Tried to stop medication due to "abnormal" bone density study. Within 24 hours she had terrible heartburn. She was given Fosamax to take but hasn't tolerate oral bisphosphonates in the past. Taking citracal plus D BID. States she has been on PPI for twenty years and PCP concerned.   Current Outpatient Prescriptions  Medication Sig Dispense Refill  . acetaminophen (TYLENOL) 325 MG tablet Take 650 mg by mouth every 4 (four) hours as needed.     Marland Kitchen atorvastatin (LIPITOR) 10 MG tablet Take 1 tablet (10 mg total) by mouth daily. 30 tablet 5  . Cyanocobalamin (VITAMIN B-12 CR PO) Take 1 tablet by mouth daily.     Marland Kitchen  gabapentin (NEURONTIN) 100 MG capsule Take 2 capsules (200 mg total) by mouth at bedtime. 60 capsule 0  . hyoscyamine (LEVSIN SL) 0.125 MG SL tablet Place 1 tablet (0.125 mg total) under the tongue 4 (four) times daily -  before meals and at bedtime. prn 30 tablet 0  . meclizine (ANTIVERT) 25 MG tablet Take 25 mg by mouth 3 (three) times daily as needed for dizziness.    . Multiple Vitamins-Minerals (CENTRUM SILVER PO) Take 1 tablet by mouth daily.     Marland Kitchen omeprazole (PRILOSEC) 20 MG capsule Take 20 mg by mouth daily.      . OXcarbazepine (TRILEPTAL) 150 MG tablet Take 75-150 mg by mouth 2 (two) times daily. 1/2 in morning, 1 at bedtime    . Probiotic Product (PROBIOTIC DAILY PO) Take by mouth.    Marland Kitchen tiZANidine (ZANAFLEX) 2 MG tablet Take 1 tablet (2 mg total) by mouth at bedtime. 30 tablet 1  . ranitidine (ZANTAC) 150 MG capsule Take 1 capsule (150 mg total) by mouth 2 (two) times daily. Alternate with the omeprazole 60 capsule 5   No current facility-administered medications for this visit.    Allergies as of 07/14/2014 - Review Complete 07/14/2014  Allergen Reaction Noted  . Albuterol Rash 05/24/2014  . Cefdinir  07/23/2012    Past Medical History  Diagnosis Date  . High cholesterol   . Acid reflux   . Cancer  Basal Cell on right shoulder  . Kidney stone   . Vertigo   . Diverticulitis   . Colon polyps     Past Surgical History  Procedure Laterality Date  . Foot surgery      Left foot, metal plate  . Other surgical history      Uterine Prolapse/Tacking  . Cataract extraction    . Colonoscopy  05/27/2008    [facility]:XAJOINOMVE rectal polyp,s/p bx/sigmoid diverticula. hyperplastic polyps. benign random bx  . Esophagogastroduodenoscopy  05/27/2008    [facility]:NOBSJG/GEZMO HH    Family History  Problem Relation Age of Onset  . Diabetes Mother   . Stroke Father   . Heart disease Mother     MI  . Uterine cancer Mother   . Cancer Paternal Grandmother     intestinal  . Stomach  cancer Maternal Grandmother   . Colon cancer Neg Hx     History   Social History  . Marital Status: Married    Spouse Name: N/A  . Number of Children: N/A  . Years of Education: N/A   Occupational History  . Not on file.   Social History Main Topics  . Smoking status: Never Smoker   . Smokeless tobacco: Not on file  . Alcohol Use: No  . Drug Use: No  . Sexual Activity: Not on file   Other Topics Concern  . Not on file   Social History Narrative   Married 62 years.   Has three children, three grandchildren, one great grandchildren.   Enjoys going to church, baking, shop.      ROS:  General: Negative for anorexia, weight loss, fever, chills, fatigue, weakness. Eyes: Negative for vision changes.  ENT: Negative for hoarseness, difficulty swallowing , nasal congestion. CV: Negative for chest pain, angina, palpitations, dyspnea on exertion, peripheral edema.  Respiratory: Negative for dyspnea at rest, dyspnea on exertion, cough, sputum, wheezing.  GI: See history of present illness. GU:  Negative for dysuria, hematuria, urinary incontinence, urinary frequency, nocturnal urination.  MS: Negative for joint pain, low back pain.  Derm: Negative for rash or itching.  Neuro: Negative for weakness, abnormal sensation, seizure, frequent headaches, memory loss, confusion.  Psych: Negative for anxiety, depression, suicidal ideation, hallucinations.  Endo: Negative for unusual weight change.  Heme: Negative for bruising or bleeding. Allergy: Negative for rash or hives.    Physical Examination:  BP 126/71 mmHg  Pulse 76  Temp(Src) 97.1 F (36.2 C)  Ht 5' (1.524 m)  Wt 156 lb (70.761 kg)  BMI 30.47 kg/m2   General: Well-nourished, well-developed in no acute distress.  Head: Normocephalic, atraumatic.   Eyes: Conjunctiva pink, no icterus. Mouth: Oropharyngeal mucosa moist and pink , no lesions erythema or exudate. Neck: Supple without thyromegaly, masses, or  lymphadenopathy.  Lungs: Clear to auscultation bilaterally.  Heart: Regular rate and rhythm, no murmurs rubs or gallops.  Abdomen: Bowel sounds are normal, nontender, nondistended, no hepatosplenomegaly or masses, no abdominal bruits or    hernia , no rebound or guarding.   Rectal: not performed Extremities: No lower extremity edema. No clubbing or deformities.  Neuro: Alert and oriented x 4 , grossly normal neurologically.  Skin: Warm and dry, no rash or jaundice.   Psych: Alert and cooperative, normal mood and affect.  Labs: Outside records. Lab Results  Component Value Date   WBC 7.0 06/17/2014   HGB 13.9 06/17/2014   HCT 41.8 06/17/2014   MCV 89.0 06/17/2014   PLT 182.0 06/17/2014   Lab  Results  Component Value Date   TSH 1.14 06/17/2014   Lab Results  Component Value Date   HGBA1C 6.0 06/17/2014   Lab Results  Component Value Date   CREATININE 0.84 06/17/2014   BUN 13 06/17/2014   NA 138 06/17/2014   K 4.1 06/17/2014   CL 104 06/17/2014   CO2 29 06/17/2014   Lab Results  Component Value Date   ALT 18 06/17/2014   AST 5 06/17/2014   ALKPHOS 63 06/17/2014   BILITOT 0.5 06/17/2014     Imaging Studies: Dg Bone Density  07/01/2014   CLINICAL DATA:  "81 year old postmenopausal female with long-term use of proton pump inhibitor and anti seizure medication. Currently taking 500 mg calcium and 500 international units vitamin-D per day.  EXAM: DUAL X-RAY ABSORPTIOMETRY (DXA) FOR BONE MINERAL DENSITY  FINDINGS: AP LUMBAR SPINE L3-L4  Bone Mineral Density (BMD):  0.790 g/cm2  Young Adult T-Score:  -2.8  Z-Score:  0.0  Left FEMUR neck  Bone Mineral Density (BMD):  0.644 g/cm2  Young Adult T-Score: -1.9  Z-Score:  0.5  ASSESSMENT: Patient's diagnostic category is osteoporosis by WHO Criteria.  FRACTURE RISK: High  FRAX: World Health Organization FRAX assessment of absolute fracture risk is not calculated for this patient because the patient has osteoporosis.  COMPARISON: None.   Effective therapies are available in the form of bisphosphonates, selective estrogen receptor modulators, biologic agents, and hormone replacement therapy (for women). All patients should ensure an adequate intake of dietary calcium (1200 mg daily) and vitamin D (800 IU daily) unless contraindicated.  All treatment decisions require clinical judgment and consideration of individual patient factors, including patient preferences, co-morbidities, previous drug use, risk factors not captured in the FRAX model (e.g., frailty, falls, vitamin D deficiency, increased bone turnover, interval significant decline in bone density) and possible under- or over-estimation of fracture risk by FRAX.  The National Osteoporosis Foundation recommends that FDA-approved medical therapies be considered in postmenopausal women and men age 38 or older with a:  1. Hip or vertebral (clinical or morphometric) fracture.  2. T-score of -2.5 or lower at the spine or hip.  3. Ten-year fracture probability by FRAX of 3% or greater for hip fracture or 20% or greater for major osteoporotic fracture.  People with diagnosed cases of osteoporosis or at high risk for fracture should have regular bone mineral density tests. For patients eligible for Medicare, routine testing is allowed once every 2 years. The testing frequency can be increased to one year for patients who have rapidly progressing disease, those who are receiving or discontinuing medical therapy to restore bone mass, or have additional risk factors.  World [facility]) Criteria:  Normal: T-scores from +1.0 to -1.0  Low Bone Mass (Osteopenia): T-scores between -1.0 and -2.5  Osteoporosis: T-scores -2.5 and below  Comparison to Reference Population:  T-score is the key measure used in the diagnosis of osteoporosis and relative risk determination for fracture. It provides a value for bone mass relative to the mean bone mass of a young adult reference population expressed in  terms of standard deviation (SD).  Z-score is the age-matched score showing the patient's values compared to a population matched for age, sex, and race. This is also expressed in terms of standard deviation. The patient may have values that compare favorably to the age-matched values and still be at increased risk for fracture.   Electronically Signed   By: Conchita Paris M.D.   On: 07/01/2014 14:18

## 2014-07-15 NOTE — Progress Notes (Signed)
RECALL MADE IN EPIC

## 2014-07-15 NOTE — Progress Notes (Signed)
CC'ED TO PCP 

## 2014-07-19 ENCOUNTER — Encounter: Payer: Self-pay | Admitting: Primary Care

## 2014-07-23 ENCOUNTER — Ambulatory Visit (INDEPENDENT_AMBULATORY_CARE_PROVIDER_SITE_OTHER): Payer: Medicare PPO | Admitting: Orthopedic Surgery

## 2014-07-23 ENCOUNTER — Telehealth: Payer: Self-pay | Admitting: Primary Care

## 2014-07-23 ENCOUNTER — Encounter: Payer: Self-pay | Admitting: Orthopedic Surgery

## 2014-07-23 VITALS — BP 140/76 | Ht 60.0 in | Wt 156.0 lb

## 2014-07-23 DIAGNOSIS — M129 Arthropathy, unspecified: Secondary | ICD-10-CM

## 2014-07-23 DIAGNOSIS — M5416 Radiculopathy, lumbar region: Secondary | ICD-10-CM | POA: Diagnosis not present

## 2014-07-23 DIAGNOSIS — IMO0001 Reserved for inherently not codable concepts without codable children: Secondary | ICD-10-CM

## 2014-07-23 DIAGNOSIS — M1711 Unilateral primary osteoarthritis, right knee: Secondary | ICD-10-CM

## 2014-07-23 NOTE — Progress Notes (Signed)
Patient ID: Shari Vincent, female   DOB: 02-Jan-1932, 81 y.o.   MRN: [id number] Chief Complaint  Patient presents with  . Follow-up    3 month recheck on right knee, respond to medication.    History: Patient got good relief from injection in her lower back when she went to see the neurosurgeon they also put her on muscle relaxer and also start some gabapentin or at least continued what I had put her on. Put her in physical therapy. A lot of her knee pain is gone as I suspected.  At this point no knee replacement is needed because she's not having significant symptoms. We will try to get orthopedist for her but Medicare has really prevented [place] from doing that because they won't cover the medication  Explained to her that the doctors office has to eat a lot of that cost and what they will completely covered  So at this point will get to see her in a few weeks if she is any better or gets worse. She will continue current medications and therapy

## 2014-07-23 NOTE — Telephone Encounter (Signed)
I have finally been able to get Shari Vincent added to our [employer] portal. I have sent pt's info for [name] verification and will notify you once I have a response. Thank you.

## 2014-08-02 ENCOUNTER — Encounter: Payer: Self-pay | Admitting: Primary Care

## 2014-08-03 ENCOUNTER — Encounter: Payer: Self-pay | Admitting: Primary Care

## 2014-08-03 ENCOUNTER — Ambulatory Visit (INDEPENDENT_AMBULATORY_CARE_PROVIDER_SITE_OTHER): Payer: Medicare PPO | Admitting: Primary Care

## 2014-08-03 VITALS — BP 122/76 | HR 63 | Temp 97.5°F | Ht 60.0 in | Wt 159.1 lb

## 2014-08-03 DIAGNOSIS — N939 Abnormal uterine and vaginal bleeding, unspecified: Secondary | ICD-10-CM | POA: Diagnosis not present

## 2014-08-03 LAB — POCT URINALYSIS DIPSTICK
Bilirubin, UA: NEGATIVE
Glucose, UA: NEGATIVE
Ketones, UA: NEGATIVE
Leukocytes, UA: NEGATIVE
Nitrite, UA: NEGATIVE
Protein, UA: NEGATIVE
Spec Grav, UA: 1.01
Urobilinogen, UA: NEGATIVE
pH, UA: 6

## 2014-08-03 NOTE — Progress Notes (Signed)
Pre visit review using our clinic review tool, if applicable. No additional management support is needed unless otherwise documented below in the visit note. 

## 2014-08-03 NOTE — Patient Instructions (Addendum)
Stop by [facility]'s office on the way out to schedule your ultrasound and OB/GYN appointments. I will notify you of the results once I receive them.  It was nice seeing you!

## 2014-08-03 NOTE — Assessment & Plan Note (Signed)
Present since 07/31/14. Moderate amount of bleeding upon exam. Cystocele and rectocele present. Bi manual exam unremarkable.  Urgent referral made to GYN.  Pelvic and trans vag ultrasound ordered. Will continue to follow.

## 2014-08-03 NOTE — Progress Notes (Signed)
Subjective:    Patient ID: Shari Vincent, female    DOB: [birth year], 81 y.o.   MRN: [id number]  HPI  Shari Vincent is an 81 year old female who presents today with a chief complaint of vaginal bleeding that has been present since Friday. She first noticed the bleeding Friday after wiping when using the bathroom. She's wearing pads and will have to change her pad twice to three times daily due to saturation of bright red blood. She notices the blood only after urinating and feels a pressure to her uterus. She has a history of urinary incontinence which is unchanged, and a family history of uterine cancer (mother). Overall her bleeding is constant and has been heavier today.   Review of Systems  Constitutional: Positive for fatigue. Negative for fever and chills.  Respiratory: Negative for shortness of breath.   Cardiovascular: Negative for chest pain.  Gastrointestinal: Negative for abdominal pain.  Genitourinary: Positive for vaginal bleeding. Negative for dysuria.       Uterine pressure  Neurological: Negative for dizziness.       Past Medical History  Diagnosis Date  . High cholesterol   . Acid reflux   . Cancer     Basal Cell on right shoulder  . Kidney stone   . Vertigo   . Diverticulitis   . Colon polyps     History   Social History  . Marital Status: Married    Spouse Name: N/A  . Number of Children: N/A  . Years of Education: N/A   Occupational History  . Not on file.   Social History Main Topics  . Smoking status: Never Smoker   . Smokeless tobacco: Not on file  . Alcohol Use: No  . Drug Use: No  . Sexual Activity: Not on file   Other Topics Concern  . Not on file   Social History Narrative   Married 62 years.   Has three children, three grandchildren, one great grandchildren.   Enjoys going to church, baking, shop.    Past Surgical History  Procedure Laterality Date  . Foot surgery      Left foot, metal plate  . Other surgical history     Uterine Prolapse/Tacking  . Cataract extraction    . Colonoscopy  05/27/2008    [facility]:NOBSJGGEZM rectal polyp,s/p bx/sigmoid diverticula. hyperplastic polyps. benign random bx  . Esophagogastroduodenoscopy  05/27/2008    [facility]:UTMLYY/TKPTW HH    Family History  Problem Relation Age of Onset  . Diabetes Mother   . Stroke Father   . Heart disease Mother     MI  . Uterine cancer Mother   . Cancer Paternal Grandmother     intestinal  . Stomach cancer Maternal Grandmother   . Colon cancer Neg Hx     Allergies  Allergen Reactions  . Albuterol Rash  . Cefdinir     Current Outpatient Prescriptions on File Prior to Visit  Medication Sig Dispense Refill  . Cyanocobalamin (VITAMIN B-12 CR PO) Take 1 tablet by mouth daily.     Marland Kitchen gabapentin (NEURONTIN) 100 MG capsule Take 2 capsules (200 mg total) by mouth at bedtime. 60 capsule 0  . hyoscyamine (LEVSIN SL) 0.125 MG SL tablet Place 1 tablet (0.125 mg total) under the tongue 4 (four) times daily -  before meals and at bedtime. prn 30 tablet 0  . meclizine (ANTIVERT) 25 MG tablet Take 25 mg by mouth 3 (three) times daily as needed for dizziness.    Marland Kitchen  Multiple Vitamins-Minerals (CENTRUM SILVER PO) Take 1 tablet by mouth daily.     Marland Kitchen omeprazole (PRILOSEC) 20 MG capsule Take 20 mg by mouth daily.      . OXcarbazepine (TRILEPTAL) 150 MG tablet Take 75-150 mg by mouth 2 (two) times daily. 1/2 in morning, 1 at bedtime    . Probiotic Product (PROBIOTIC DAILY PO) Take by mouth.    Marland Kitchen tiZANidine (ZANAFLEX) 2 MG tablet Take 1 tablet (2 mg total) by mouth at bedtime. 30 tablet 1  . acetaminophen (TYLENOL) 325 MG tablet Take 650 mg by mouth every 4 (four) hours as needed.     Marland Kitchen atorvastatin (LIPITOR) 10 MG tablet Take 1 tablet (10 mg total) by mouth daily. (Patient not taking: Reported on 08/03/2014) 30 tablet 5  . ranitidine (ZANTAC) 150 MG capsule Take 1 capsule (150 mg total) by mouth 2 (two) times daily. Alternate with the omeprazole (Patient not taking:  Reported on 08/03/2014) 60 capsule 5   No current facility-administered medications on file prior to visit.    BP 122/76 mmHg  Pulse 63  Temp(Src) 97.5 F (36.4 C) (Oral)  Ht 5' (1.524 m)  Wt 159 lb 1.9 oz (72.176 kg)  BMI 31.08 kg/m2  SpO2 98%    Objective:   Physical Exam  Constitutional: She does not appear ill.  Cardiovascular: Normal rate and regular rhythm.   Pulmonary/Chest: Effort normal and breath sounds normal.  Abdominal: Soft. Bowel sounds are normal. There is tenderness in the right upper quadrant, epigastric area and suprapubic area.  Genitourinary:  Moderate amount of vaginal bleeding noted upon exam. Also cyctocele and rectocele present on exam. Ovaries unremarkable.  Skin: Skin is warm and dry.          Assessment & Plan:

## 2014-08-04 ENCOUNTER — Other Ambulatory Visit: Payer: Self-pay | Admitting: Primary Care

## 2014-08-06 ENCOUNTER — Encounter: Payer: Medicare PPO | Admitting: Obstetrics & Gynecology

## 2014-08-06 ENCOUNTER — Ambulatory Visit (INDEPENDENT_AMBULATORY_CARE_PROVIDER_SITE_OTHER): Payer: Medicare PPO | Admitting: Obstetrics & Gynecology

## 2014-08-06 DIAGNOSIS — N950 Postmenopausal bleeding: Secondary | ICD-10-CM

## 2014-08-06 NOTE — Progress Notes (Signed)
   Subjective:    Patient ID: Shari Vincent, female    DOB: Oct 03, 1931, 81 y.o.   MRN: [id number]  HPI  This 81 yo W lady is here with a week h/o vaginal bleeding/pmb. Her mother died of endometrial cancer at age 22.   Review of Systems     Objective:   Physical Exam  [id number] Abd- benign EG, vagina normal (+rectocele and cystocele)   UPT negative, consent signed, time out done Cervix prepped with betadine and grasped with a single tooth tenaculum Uterus sounded to 10 cm Pipelle used for 2 passes with a moderate amount of tissue obtained. She tolerated the procedure well.  8-10 week size uterus, non-enlarged adnexa     Assessment & Plan:   PMB- await EMBX Schedule gyn u/s asap

## 2014-08-07 ENCOUNTER — Ambulatory Visit (HOSPITAL_COMMUNITY)
Admission: RE | Admit: 2014-08-07 | Discharge: 2014-08-07 | Disposition: A | Discharge status: Home / Self Care | Payer: Medicare PPO | Source: Ambulatory Visit | Attending: Obstetrics & Gynecology | Admitting: Obstetrics & Gynecology

## 2014-08-07 DIAGNOSIS — N950 Postmenopausal bleeding: Secondary | ICD-10-CM | POA: Insufficient documentation

## 2014-08-07 DIAGNOSIS — D259 Leiomyoma of uterus, unspecified: Secondary | ICD-10-CM | POA: Insufficient documentation

## 2014-08-11 ENCOUNTER — Encounter: Payer: Self-pay | Admitting: Primary Care

## 2014-08-11 NOTE — Telephone Encounter (Signed)
I have rec'd pt's insurance verification for Prolia.  If an OV is billed pt will have an estimated responsibility of a $15 co-pay, which will cover Prolia and the admin; if no OV is billed then [place] are covered at 100% leaving pt w/an estimated responsibility of $0.  Please advise her this is an estimate and we will not know an exact amt until her insurance has paid.  I have sent a copy of the summary of benefits to be scanned into her chart.  If you have any questions, please let me know. Thank you.

## 2014-08-13 ENCOUNTER — Telehealth: Payer: Self-pay

## 2014-08-13 NOTE — Telephone Encounter (Signed)
Pt request results from [place] and biopsy done last week. Advised pt will need to contact Dr Alease Medina office. Offered to transfer pt to Dr Audie Box office but pt said she had the number.

## 2014-08-14 ENCOUNTER — Encounter: Payer: Self-pay | Admitting: Obstetrics & Gynecology

## 2014-08-17 ENCOUNTER — Encounter: Payer: Self-pay | Admitting: Primary Care

## 2014-08-17 ENCOUNTER — Telehealth: Payer: Self-pay | Admitting: *Deleted

## 2014-08-17 NOTE — Telephone Encounter (Signed)
Called and updated family and patient with results.

## 2014-08-17 NOTE — Telephone Encounter (Signed)
I went over the results of the biopsy and ultrasound with Dr. Elly Modena.  Per Dr. Elly Modena patient needs to come in to see one of the other doctors since Dr. Hulan Fray is out of the office for several weeks and discuss U/S results and plan.  I made patient aware of the results and offered to schedule her an appointment but patient said she would talk to her daughter and call the office back to schedule an appointment.

## 2014-08-17 NOTE — Telephone Encounter (Signed)
Pt still has not gotten [place] or biopsy results and pt is unable to get answer at Dr [facility] office; I called Dr Alease Medina office at [facility] [phone] and spoke with someone and transferred pt to them for further info.

## 2014-08-20 ENCOUNTER — Encounter: Payer: Self-pay | Admitting: Orthopedic Surgery

## 2014-08-20 ENCOUNTER — Ambulatory Visit (INDEPENDENT_AMBULATORY_CARE_PROVIDER_SITE_OTHER): Payer: Medicare PPO | Admitting: Orthopedic Surgery

## 2014-08-20 VITALS — BP 153/70 | Ht 60.0 in | Wt 159.0 lb

## 2014-08-20 DIAGNOSIS — M1711 Unilateral primary osteoarthritis, right knee: Secondary | ICD-10-CM

## 2014-08-20 DIAGNOSIS — M129 Arthropathy, unspecified: Secondary | ICD-10-CM | POA: Diagnosis not present

## 2014-08-20 NOTE — Progress Notes (Signed)
Patient ID: ETHAN KASPERSKI, female   DOB: [birth year], 81 y.o.   MRN: [id number]  Follow up visit  Chief Complaint  Patient presents with  . Follow-up    4 week recheck on right knee.    BP 153/70 mmHg  Ht 5' (1.524 m)  Wt 159 lb (72.122 kg)  BMI 31.05 kg/m2  Encounter Diagnosis  Name Primary?  Marland Kitchen Arthritis of right knee Yes    Complaints the patient says she is doing much better therapy is helping her injections helped in her back  Her knee looks good she can bend it straighten it straight leg raise looks good  Follow-up as needed

## 2014-08-25 ENCOUNTER — Encounter (HOSPITAL_COMMUNITY): Payer: Self-pay | Admitting: *Deleted

## 2014-08-25 ENCOUNTER — Ambulatory Visit (INDEPENDENT_AMBULATORY_CARE_PROVIDER_SITE_OTHER): Payer: Medicare PPO | Admitting: Family Medicine

## 2014-08-25 ENCOUNTER — Encounter: Payer: Self-pay | Admitting: Family Medicine

## 2014-08-25 VITALS — BP 126/78 | HR 76 | Ht 60.0 in | Wt 160.0 lb

## 2014-08-25 DIAGNOSIS — R1900 Intra-abdominal and pelvic swelling, mass and lump, unspecified site: Secondary | ICD-10-CM | POA: Diagnosis not present

## 2014-08-25 DIAGNOSIS — N950 Postmenopausal bleeding: Secondary | ICD-10-CM | POA: Diagnosis not present

## 2014-08-25 NOTE — Assessment & Plan Note (Addendum)
Given her thickened lining, will proceed with D & C with hysteroscopy. Risks include but are not limited to bleeding, infection, injury to surrounding structures, including bowel, bladder and ureters, blood clots, and death.  Likelihood of success is high to get a diagnosis.

## 2014-08-25 NOTE — Assessment & Plan Note (Signed)
Check MRI 

## 2014-08-25 NOTE — Progress Notes (Signed)
    Subjective:    Patient ID: Shari Vincent is a 81 y.o. female presenting with Follow-up  on 08/25/2014  HPI: Here today for f/u.  EMB is negative, but u/s shows thickened lining at 1 cm. Also, noted a 5 cm solid mass, possibly a pedunculated fibroid, but needs MRI.  Review of Systems  Constitutional: Negative for fever and chills.  Respiratory: Negative for shortness of breath.   Cardiovascular: Negative for chest pain.  Gastrointestinal: Negative for nausea, vomiting and abdominal pain.  Genitourinary: Negative for dysuria.  Skin: Negative for rash.      Objective:    BP 126/78 mmHg  Pulse 76  Ht 5' (1.524 m)  Wt 160 lb (72.576 kg)  BMI 31.25 kg/m2 Physical Exam  Constitutional: She is oriented to person, place, and time. She appears well-developed and well-nourished. No distress.  HENT:  Head: Normocephalic and atraumatic.  Eyes: No scleral icterus.  Neck: Neck supple.  Cardiovascular: Normal rate.   Pulmonary/Chest: Effort normal.  Abdominal: Soft.  Neurological: She is alert and oriented to person, place, and time.  Skin: Skin is warm and dry.  Psychiatric: She has a normal mood and affect.        Assessment & Plan:   Problem List Items Addressed This Visit      Unprioritized   PMB (postmenopausal bleeding)    Given her thickened lining, will proceed with D & C with hysteroscopy. Risks include but are not limited to bleeding, infection, injury to surrounding structures, including bowel, bladder and ureters, blood clots, and death.  Likelihood of success is high to get a diagnosis.       Pelvic mass in female - Primary    Check MRI      Relevant Orders   MR Pelvis W Wo Contrast       Return in about 4 weeks (around 09/22/2014) for postop check.  Kirkland Figg S 08/25/2014 1:41 PM

## 2014-08-25 NOTE — Progress Notes (Signed)
Here today for biopsy results.  Very concerned that it took 11 days to get a call back regarding test results.  Bled for 5-7 days around the time of the biopsy.

## 2014-08-25 NOTE — H&P (Signed)
Shari Vincent is an 81 y.o.  female.   Chief Complaint: Postmenopausal bleeding HPI: Several week h/o PMB. EMB is negative, but u/s shows thickened lining at 1 cm. Also, noted a 5 cm solid mass, possibly a pedunculated fibroid, but needs MRI.  Past Medical History  Diagnosis Date  . High cholesterol   . Acid reflux   . Cancer     Basal Cell on right shoulder  . Kidney stone   . Vertigo   . Diverticulitis   . Colon polyps     Past Surgical History  Procedure Laterality Date  . Foot surgery      Left foot, metal plate  . Other surgical history      Uterine Prolapse/Tacking  . Cataract extraction    . Colonoscopy  05/27/2008    ELF:YBOFBPZWCH rectal polyp,s/p bx/sigmoid diverticula. hyperplastic polyps. benign random bx  . Esophagogastroduodenoscopy  05/27/2008    ENI:DPOEUM/PNTIR HH    Family History  Problem Relation Age of Onset  . Diabetes Mother   . Stroke Father   . Heart disease Mother     MI  . Uterine cancer Mother   . Cancer Paternal Grandmother     intestinal  . Stomach cancer Maternal Grandmother   . Colon cancer Neg Hx    Social History:  reports that she has never smoked. She does not have any smokeless tobacco history on file. She reports that she does not drink alcohol or use illicit drugs.  Allergies:  Allergies  Allergen Reactions  . Albuterol Rash  . Cefdinir     No current facility-administered medications on file prior to encounter.   Current Outpatient Prescriptions on File Prior to Encounter  Medication Sig Dispense Refill  . acetaminophen (TYLENOL) 325 MG tablet Take 650 mg by mouth every 4 (four) hours as needed.     Marland Kitchen atorvastatin (LIPITOR) 10 MG tablet Take 1 tablet (10 mg total) by mouth daily. (Patient not taking: Reported on 08/03/2014) 30 tablet 5  . Cyanocobalamin (VITAMIN B-12 CR PO) Take 1 tablet by mouth daily.     Marland Kitchen gabapentin (NEURONTIN) 100 MG capsule Take 2 capsules (200 mg total) by mouth at bedtime. 60 capsule 0  .  hyoscyamine (LEVSIN SL) 0.125 MG SL tablet Place 1 tablet (0.125 mg total) under the tongue 4 (four) times daily -  before meals and at bedtime. prn 30 tablet 0  . meclizine (ANTIVERT) 25 MG tablet Take 25 mg by mouth 3 (three) times daily as needed for dizziness.    . Multiple Vitamins-Minerals (CENTRUM SILVER PO) Take 1 tablet by mouth daily.     Marland Kitchen omeprazole (PRILOSEC) 20 MG capsule Take 20 mg by mouth daily.      . OXcarbazepine (TRILEPTAL) 150 MG tablet Take 75-150 mg by mouth 2 (two) times daily. 1/2 in morning, 1 at bedtime    . Probiotic Product (PROBIOTIC DAILY PO) Take by mouth.      Pertinent items are noted in HPI.  There were no vitals taken for this visit. There were no vitals taken for this visit. General appearance: alert, cooperative and appears stated age Neck: supple, symmetrical, trachea midline Lungs: normal effort Heart: regular rate and rhythm Abdomen: soft, non-tender; bowel sounds normal; no masses,  no organomegaly Extremities: extremities normal, atraumatic, no cyanosis or edema Skin: Skin color, texture, turgor normal. No rashes or lesions Lymph nodes: Cervical, supraclavicular, and axillary nodes normal. Neurologic: Grossly normal   Lab Results  Component Value Date  WBC 7.0 06/17/2014   HGB 13.9 06/17/2014   HCT 41.8 06/17/2014   MCV 89.0 06/17/2014   PLT 182.0 06/17/2014   No results found for: PREGTESTUR, PREGSERUM, HCG, HCGQUANT   Assessment/Plan  Principal Problem:   PMB (postmenopausal bleeding) Given her thickened lining, will proceed with D & C with hysteroscopy. Risks include but are not limited to bleeding, infection, injury to surrounding structures, including bowel, bladder and ureters, blood clots, and death. Likelihood of success is high to get a diagnosis.  PRATT,TANYA S 08/25/2014, 4:45 PM

## 2014-08-27 ENCOUNTER — Encounter (HOSPITAL_COMMUNITY)
Admission: RE | Admit: 2014-08-27 | Discharge: 2014-08-27 | Disposition: A | Discharge status: Home / Self Care | Payer: Medicare PPO | Source: Ambulatory Visit | Attending: Family Medicine | Admitting: Family Medicine

## 2014-08-27 ENCOUNTER — Encounter (HOSPITAL_COMMUNITY): Payer: Self-pay

## 2014-08-27 DIAGNOSIS — N950 Postmenopausal bleeding: Secondary | ICD-10-CM | POA: Diagnosis not present

## 2014-08-27 DIAGNOSIS — Z01818 Encounter for other preprocedural examination: Secondary | ICD-10-CM | POA: Diagnosis present

## 2014-08-27 HISTORY — DX: Trigeminal neuralgia: G50.0

## 2014-08-27 HISTORY — DX: Other intervertebral disc degeneration, lumbar region: M51.36

## 2014-08-27 HISTORY — DX: Irritable bowel syndrome, unspecified: K58.9

## 2014-08-27 HISTORY — DX: Unspecified osteoarthritis, unspecified site: M19.90

## 2014-08-27 HISTORY — DX: Other intervertebral disc displacement, lumbar region: M51.26

## 2014-08-27 HISTORY — DX: Other intervertebral disc degeneration, lumbar region without mention of lumbar back pain or lower extremity pain: M51.369

## 2014-08-27 LAB — CBC
HCT: 40 % (ref 36.0–46.0)
Hemoglobin: 13.5 g/dL (ref 12.0–15.0)
MCH: 30.7 pg (ref 26.0–34.0)
MCHC: 33.8 g/dL (ref 30.0–36.0)
MCV: 90.9 fL (ref 78.0–100.0)
Platelets: 186 10*3/uL (ref 150–400)
RBC: 4.4 MIL/uL (ref 3.87–5.11)
RDW: 14.1 % (ref 11.5–15.5)
WBC: 7.9 10*3/uL (ref 4.0–10.5)

## 2014-08-27 LAB — TYPE AND SCREEN
ABO/RH(D): B POS
Antibody Screen: NEGATIVE

## 2014-08-27 LAB — ABO/RH: ABO/RH(D): B POS

## 2014-08-27 NOTE — Patient Instructions (Signed)
Your procedure is scheduled on:  September 03, 2014  Enter through the Main Entrance of [facility] at:  12:30 pm     Pick up the phone at the desk and dial [phone].  Call this number if you have problems the morning of surgery: [phone].  Remember: Do NOT eat food: after midnight on Wednesday  Do NOT drink clear liquids after:  10:00 am day of surgery  Take these medicines the morning of surgery with a SIP OF WATER:  prilosec    Do NOT wear jewelry (body piercing), metal hair clips/bobby pins, make-up, or nail polish. Do NOT wear lotions, powders, or perfumes.  You may wear deoderant. Do NOT shave for 48 hours prior to surgery. Do NOT bring valuables to the hospital. Contacts, dentures, or bridgework may not be worn into surgery. Have a responsible adult drive you home and stay with you for 24 hours after your procedure

## 2014-08-27 NOTE — Pre-Procedure Instructions (Signed)
Patient had an EKG done on 06/16/13 at [facility]. Per Dr. Royce Macadamia it is ok to use that EKG for her surgery. EKG reviewed by Dr. Royce Macadamia and he spoke with patient. No new orders received.

## 2014-09-03 ENCOUNTER — Encounter (HOSPITAL_COMMUNITY)
Admission: RE | Disposition: A | Discharge status: Home / Self Care | Payer: Self-pay | Source: Ambulatory Visit | Attending: Family Medicine

## 2014-09-03 ENCOUNTER — Ambulatory Visit (HOSPITAL_COMMUNITY): Payer: Medicare PPO | Admitting: Anesthesiology

## 2014-09-03 ENCOUNTER — Ambulatory Visit (HOSPITAL_COMMUNITY)
Admission: RE | Admit: 2014-09-03 | Discharge: 2014-09-03 | Disposition: A | Discharge status: Home / Self Care | Payer: Medicare PPO | Source: Ambulatory Visit | Attending: Family Medicine | Admitting: Family Medicine

## 2014-09-03 ENCOUNTER — Encounter (HOSPITAL_COMMUNITY): Payer: Self-pay | Admitting: Anesthesiology

## 2014-09-03 DIAGNOSIS — K219 Gastro-esophageal reflux disease without esophagitis: Secondary | ICD-10-CM | POA: Diagnosis not present

## 2014-09-03 DIAGNOSIS — Z86010 Personal history of colonic polyps: Secondary | ICD-10-CM | POA: Diagnosis not present

## 2014-09-03 DIAGNOSIS — Z881 Allergy status to other antibiotic agents status: Secondary | ICD-10-CM | POA: Insufficient documentation

## 2014-09-03 DIAGNOSIS — Z87442 Personal history of urinary calculi: Secondary | ICD-10-CM | POA: Diagnosis not present

## 2014-09-03 DIAGNOSIS — Z888 Allergy status to other drugs, medicaments and biological substances status: Secondary | ICD-10-CM | POA: Insufficient documentation

## 2014-09-03 DIAGNOSIS — R938 Abnormal findings on diagnostic imaging of other specified body structures: Secondary | ICD-10-CM | POA: Diagnosis not present

## 2014-09-03 DIAGNOSIS — Z79899 Other long term (current) drug therapy: Secondary | ICD-10-CM | POA: Insufficient documentation

## 2014-09-03 DIAGNOSIS — N950 Postmenopausal bleeding: Secondary | ICD-10-CM | POA: Diagnosis present

## 2014-09-03 HISTORY — PX: HYSTEROSCOPY WITH D & C: SHX1775

## 2014-09-03 LAB — TYPE AND SCREEN
ABO/RH(D): B POS
Antibody Screen: NEGATIVE

## 2014-09-03 SURGERY — DILATATION AND CURETTAGE /HYSTEROSCOPY
Anesthesia: General | Site: Vagina

## 2014-09-03 MED ORDER — BUPIVACAINE HCL 0.25 % IJ SOLN
INTRAMUSCULAR | Status: DC | PRN
  Administered 2014-09-03: 20 mL

## 2014-09-03 MED ORDER — ONDANSETRON HCL 4 MG/2ML IJ SOLN
INTRAMUSCULAR | Status: DC | PRN
  Administered 2014-09-03: 4 mg via INTRAVENOUS

## 2014-09-03 MED ORDER — PROMETHAZINE HCL 25 MG/ML IJ SOLN: 6.2500 mg | INTRAMUSCULAR | Status: DC | PRN

## 2014-09-03 MED ORDER — FENTANYL CITRATE (PF) 100 MCG/2ML IJ SOLN
INTRAMUSCULAR | Status: DC | PRN
  Administered 2014-09-03 (×3): 25 ug via INTRAVENOUS

## 2014-09-03 MED ORDER — PROPOFOL 10 MG/ML IV BOLUS
INTRAVENOUS | Status: AC
  Filled 2014-09-03: qty 20

## 2014-09-03 MED ORDER — ONDANSETRON HCL 4 MG/2ML IJ SOLN
INTRAMUSCULAR | Status: AC
  Filled 2014-09-03: qty 2

## 2014-09-03 MED ORDER — LIDOCAINE HCL (CARDIAC) 20 MG/ML IV SOLN
INTRAVENOUS | Status: DC | PRN
  Administered 2014-09-03: 100 mg via INTRAVENOUS

## 2014-09-03 MED ORDER — PROPOFOL 10 MG/ML IV BOLUS
INTRAVENOUS | Status: DC | PRN
  Administered 2014-09-03: 30 mg via INTRAVENOUS
  Administered 2014-09-03: 120 mg via INTRAVENOUS

## 2014-09-03 MED ORDER — FENTANYL CITRATE (PF) 100 MCG/2ML IJ SOLN: 25.0000 ug | INTRAMUSCULAR | Status: DC | PRN

## 2014-09-03 MED ORDER — BUPIVACAINE-EPINEPHRINE 0.25% -1:200000 IJ SOLN: INTRAMUSCULAR | Status: DC | PRN

## 2014-09-03 MED ORDER — GLYCINE 1.5 % IR SOLN
Status: DC | PRN
  Administered 2014-09-03: 3000 mL

## 2014-09-03 MED ORDER — LACTATED RINGERS IV SOLN: INTRAVENOUS | Status: DC

## 2014-09-03 MED ORDER — EPHEDRINE 5 MG/ML INJ
INTRAVENOUS | Status: AC
  Filled 2014-09-03: qty 10

## 2014-09-03 MED ORDER — MEPERIDINE HCL 25 MG/ML IJ SOLN: 6.2500 mg | INTRAMUSCULAR | Status: DC | PRN

## 2014-09-03 MED ORDER — FENTANYL CITRATE (PF) 100 MCG/2ML IJ SOLN
INTRAMUSCULAR | Status: AC
  Filled 2014-09-03: qty 2

## 2014-09-03 MED ORDER — LIDOCAINE HCL (CARDIAC) 20 MG/ML IV SOLN
INTRAVENOUS | Status: AC
  Filled 2014-09-03: qty 5

## 2014-09-03 MED ORDER — BUPIVACAINE HCL (PF) 0.25 % IJ SOLN
INTRAMUSCULAR | Status: AC
  Filled 2014-09-03: qty 30

## 2014-09-03 MED ORDER — LACTATED RINGERS IV SOLN
INTRAVENOUS | Status: DC
  Administered 2014-09-03 (×2): via INTRAVENOUS

## 2014-09-03 SURGICAL SUPPLY — 18 items
CANISTER SUCT 3000ML (MISCELLANEOUS) ×3 IMPLANT
CATH ROBINSON RED A/P 16FR (CATHETERS) ×3 IMPLANT
CLOTH BEACON ORANGE TIMEOUT ST (SAFETY) ×3 IMPLANT
CONTAINER PREFILL 10% NBF 60ML (FORM) ×6 IMPLANT
DILATOR CANAL MILEX (MISCELLANEOUS) ×3 IMPLANT
ELECT REM PT RETURN 9FT ADLT (ELECTROSURGICAL)
ELECTRODE REM PT RTRN 9FT ADLT (ELECTROSURGICAL) IMPLANT
GLOVE BIOGEL PI IND STRL 7.0 (GLOVE) ×1 IMPLANT
GLOVE BIOGEL PI INDICATOR 7.0 (GLOVE) ×2
GLOVE ECLIPSE 7.0 STRL STRAW (GLOVE) ×6 IMPLANT
GOWN STRL REUS W/TWL LRG LVL3 (GOWN DISPOSABLE) ×9 IMPLANT
LOOP ANGLED CUTTING 22FR (CUTTING LOOP) IMPLANT
PACK VAGINAL MINOR WOMEN LF (CUSTOM PROCEDURE TRAY) ×3 IMPLANT
PAD OB MATERNITY 4.3X12.25 (PERSONAL CARE ITEMS) ×3 IMPLANT
TOWEL OR 17X24 6PK STRL BLUE (TOWEL DISPOSABLE) ×6 IMPLANT
TUBING AQUILEX INFLOW (TUBING) ×3 IMPLANT
TUBING AQUILEX OUTFLOW (TUBING) ×3 IMPLANT
WATER STERILE IRR 1000ML POUR (IV SOLUTION) ×3 IMPLANT

## 2014-09-03 NOTE — Anesthesia Procedure Notes (Signed)
Procedure Name: LMA Insertion Date/Time: 09/03/2014 2:11 PM Performed by: Georgeanne Nim Pre-anesthesia Checklist: Patient identified, Patient being monitored, Timeout performed, Emergency Drugs available and Suction available Patient Re-evaluated:Patient Re-evaluated prior to inductionOxygen Delivery Method: Circle system utilized Preoxygenation: Pre-oxygenation with 100% oxygen Intubation Type: IV induction Ventilation: Mask ventilation without difficulty LMA: LMA inserted LMA Size: 4.0 Number of attempts: 1 Placement Confirmation: positive ETCO2,  CO2 detector and breath sounds checked- equal and bilateral Tube secured with: Tape Dental Injury: Teeth and Oropharynx as per pre-operative assessment

## 2014-09-03 NOTE — Interval H&P Note (Signed)
History and Physical Interval Note:  09/03/2014 1:42 PM  Shari Vincent  has presented today for surgery, with the diagnosis of cpt [phone] - Postmenopausal bleeding  The various methods of treatment have been discussed with the patient and family. After consideration of risks, benefits and other options for treatment, the patient has consented to  Procedure(s) with comments: DILATATION AND CURETTAGE /HYSTEROSCOPY (N/A) - Requesting 09/03/14 @ 2:00p as a surgical intervention .  The patient's history has been reviewed, patient examined, no change in status, stable for surgery.  I have reviewed the patient's chart and labs.  Questions were answered to the patient's satisfaction.     [place]

## 2014-09-03 NOTE — Anesthesia Preprocedure Evaluation (Addendum)
Anesthesia Evaluation  Patient identified by MRN, date of birth, ID band Patient awake    Reviewed: Allergy & Precautions, NPO status , Patient's Chart, lab work & pertinent test results, reviewed documented beta blocker date and time   Airway Mallampati: II   Neck ROM: Full    Dental  (+) Partial Lower, Partial Upper   Pulmonary former smoker,  breath sounds clear to auscultation        Cardiovascular negative cardio ROS  Rhythm:Regular     Neuro/Psych    GI/Hepatic GERD-  Medicated,  Endo/Other    Renal/GU stones     Musculoskeletal   Abdominal (+)  Abdomen: soft.    Peds  Hematology   Anesthesia Other Findings Partials out  Reproductive/Obstetrics                           Anesthesia Physical Anesthesia Plan  ASA: II  Anesthesia Plan: General   Post-op Pain Management:    Induction: Intravenous  Airway Management Planned: LMA  Additional Equipment:   Intra-op Plan:   Post-operative Plan:   Informed Consent: I have reviewed the patients History and Physical, chart, labs and discussed the procedure including the risks, benefits and alternatives for the proposed anesthesia with the patient or authorized representative who has indicated his/her understanding and acceptance.     Plan Discussed with:   Anesthesia Plan Comments:         Anesthesia Quick Evaluation

## 2014-09-03 NOTE — Discharge Instructions (Signed)
Hysteroscopy Hysteroscopy is a procedure used for looking inside the womb (uterus). It may be done for various reasons, including:  To evaluate abnormal bleeding, fibroid (benign, noncancerous) tumors, polyps, scar tissue (adhesions), and possibly cancer of the uterus.  To look for lumps (tumors) and other uterine growths.  To look for causes of why a woman cannot get pregnant (infertility), causes of recurrent loss of pregnancy (miscarriages), or a lost intrauterine device (IUD).  To perform a sterilization by blocking the fallopian tubes from inside the uterus. In this procedure, a thin, flexible tube with a tiny light and camera on the end of it (hysteroscope) is used to look inside the uterus. A hysteroscopy should be done right after a menstrual period to be sure you are not pregnant. LET [facility] CARE PROVIDER KNOW ABOUT:   Any allergies you have.  All medicines you are taking, including vitamins, herbs, eye drops, creams, and over-the-counter medicines.  Previous problems you or members of your family have had with the use of anesthetics.  Any blood disorders you have.  Previous surgeries you have had.  Medical conditions you have. RISKS AND COMPLICATIONS  Generally, this is a safe procedure. However, as with any procedure, complications can occur. Possible complications include:  Putting a hole in the uterus.  Excessive bleeding.  Infection.  Damage to the cervix.  Injury to other organs.  Allergic reaction to medicines.  Too much fluid used in the uterus for the procedure. BEFORE THE PROCEDURE   Ask your health care provider about changing or stopping any regular medicines.  Do not take aspirin or blood thinners for 1 week before the procedure, or as directed by your health care provider. These can cause bleeding.  If you smoke, do not smoke for 2 weeks before the procedure.  In some cases, a medicine is placed in the cervix the day before the procedure.  This medicine makes the cervix have a larger opening (dilate). This makes it easier for the instrument to be inserted into the uterus during the procedure.  Do not eat or drink anything for at least 8 hours before the surgery.  Arrange for someone to take you home after the procedure. PROCEDURE   You may be given a medicine to relax you (sedative). You may also be given one of the following:  A medicine that numbs the area around the cervix (local anesthetic).  A medicine that makes you sleep through the procedure (general anesthetic).  The hysteroscope is inserted through the vagina into the uterus. The camera on the hysteroscope sends a picture to a TV screen. This gives the surgeon a good view inside the uterus.  During the procedure, air or a liquid is put into the uterus, which allows the surgeon to see better.  Sometimes, tissue is gently scraped from inside the uterus. These tissue samples are sent to a lab for testing. AFTER THE PROCEDURE   If you had a general anesthetic, you may be groggy for a couple hours after the procedure.  If you had a local anesthetic, you will be able to go home as soon as you are stable and feel ready.  You may have some cramping. This normally lasts for a couple days.  You may have bleeding, which varies from light spotting for a few days to menstrual-like bleeding for 3-7 days. This is normal.  If your test results are not back during the visit, make an appointment with your health care provider to find out the  results. Document Released: 05/15/2000 Document Revised: 11/27/2012 Document Reviewed: 09/05/2012 ExitCare Patient Information 2015 [employer]. This information is not intended to replace advice given to you by your health care provider. Make sure you discuss any questions you have with your health care provider.   

## 2014-09-03 NOTE — Op Note (Signed)
Preoperative diagnoses:  Postmenopausal bleeding with thickened endometrium at 11 mm.  Postoperative diagnosis: Same  Procedure: D & C, hysteroscopy  Surgeon: Standley Dakins. Kennon Rounds, MD  Findings: Normal appearing postmenopausal endometrial cavity  Anesthesia: Alexis Frock, MD Findings:   Fluid deficit: 65 cc  Estimated blood loss: Minimum  Pathology: Endometrial curettings  Indications for procedure: 81 y.o. with history of postmenopausal bleeding who presents for diagnosis with D and C with Hysteroscopy.  Procedure: The patient was taken to the operating room where spinal analgesia was inserted. SCDs were in place.  Time out was performed. Patient was placed in dorsolithotomy in [place]. She was prepped and draped in the usual sterile fashion. A Red Rubber catheter was used to drain her bladder. A speculum was placeed in the vagina. The cervix was visualized anteriorly and grasped with a single-tooth tenaculum. Paracervical block was performed with 0.25% Marcaine plain with 20 cc injected. An os finder was used to enter the endometrial cavity. The uterus sounded to 8 cm. Sequential dilation was performed with Kennon Rounds dilators. The hysteroscope was inserted and the endometrial cavity and inspected. There were no significant findings noted in the endometrial cavity with both ostia seen.  The hysteroscope was removed. Sharp curettage was performed in all 4 quadrants. All instruments were removed from the vagina. All instrument, needle and lap counts were correct x2. The patient was awakened and is recovering in stable condition.  Kristofer Schaffert S MD 09/03/2014 2:36 PM

## 2014-09-03 NOTE — Transfer of Care (Signed)
Immediate Anesthesia Transfer of Care Note  Patient: Shari Vincent  Procedure(s) Performed: Procedure(s) with comments: DILATATION AND CURETTAGE /HYSTEROSCOPY (N/A) - Requesting 09/03/14 @ 2:00p  Patient Location: PACU  Anesthesia Type:General  Level of Consciousness: awake, alert , oriented and patient cooperative  Airway & Oxygen Therapy: Patient Spontanous Breathing and Patient connected to nasal cannula oxygen  Post-op Assessment: Report given to RN and Post -op Vital signs reviewed and stable  Post vital signs: Reviewed and stable  Last Vitals:  Filed Vitals:   09/03/14 1214  BP: 143/80  Pulse: 65  Temp: 36.5 C  Resp: 20    Complications: No apparent anesthesia complications

## 2014-09-03 NOTE — Anesthesia Postprocedure Evaluation (Signed)
  Anesthesia Post-op Note  Patient: Shari Vincent  Procedure(s) Performed: Procedure(s) with comments: DILATATION AND CURETTAGE /HYSTEROSCOPY (N/A) - Requesting 09/03/14 @ 2:00p  Patient Location: PACU  Anesthesia Type:General  Level of Consciousness: awake and alert   Airway and Oxygen Therapy: Patient Spontanous Breathing  Post-op Pain: mild  Post-op Assessment: Post-op Vital signs reviewed, Patient's Cardiovascular Status Stable, Respiratory Function Stable, Patent Airway and No signs of Nausea or vomiting              Post-op Vital Signs: Reviewed and stable  Last Vitals:  Filed Vitals:   09/03/14 1214  BP: 143/80  Pulse: 65  Temp: 36.5 C  Resp: 20    Complications: No apparent anesthesia complications

## 2014-09-06 ENCOUNTER — Encounter (HOSPITAL_COMMUNITY): Payer: Self-pay | Admitting: Family Medicine

## 2014-09-08 ENCOUNTER — Telehealth: Payer: Self-pay | Admitting: *Deleted

## 2014-09-08 ENCOUNTER — Encounter: Payer: Self-pay | Admitting: *Deleted

## 2014-09-08 NOTE — Telephone Encounter (Signed)
Pam called to get pre auth for patients MRI. CPT code is [phone]. Patient is a [facility] Patient. [place] office to inform them of need for MRI authorization.

## 2014-09-09 ENCOUNTER — Ambulatory Visit (HOSPITAL_COMMUNITY)
Admission: RE | Admit: 2014-09-09 | Discharge: 2014-09-09 | Disposition: A | Discharge status: Home / Self Care | Payer: Medicare PPO | Source: Ambulatory Visit | Attending: Family Medicine | Admitting: Family Medicine

## 2014-09-09 DIAGNOSIS — N950 Postmenopausal bleeding: Secondary | ICD-10-CM | POA: Insufficient documentation

## 2014-09-09 DIAGNOSIS — R1900 Intra-abdominal and pelvic swelling, mass and lump, unspecified site: Secondary | ICD-10-CM | POA: Diagnosis present

## 2014-09-09 LAB — POCT I-STAT CREATININE: Creatinine, Ser: 0.8 mg/dL (ref 0.44–1.00)

## 2014-09-09 MED ORDER — GADOBENATE DIMEGLUMINE 529 MG/ML IV SOLN
15.0000 mL | Freq: Once | INTRAVENOUS | Status: AC | PRN
  Administered 2014-09-09: 15 mL via INTRAVENOUS

## 2014-09-24 ENCOUNTER — Encounter: Payer: Self-pay | Admitting: Obstetrics & Gynecology

## 2014-09-24 ENCOUNTER — Ambulatory Visit (INDEPENDENT_AMBULATORY_CARE_PROVIDER_SITE_OTHER): Payer: Medicare PPO | Admitting: Obstetrics & Gynecology

## 2014-09-24 VITALS — BP 143/82 | HR 69 | Resp 16 | Ht 60.0 in | Wt 164.0 lb

## 2014-09-24 DIAGNOSIS — N950 Postmenopausal bleeding: Secondary | ICD-10-CM

## 2014-09-24 DIAGNOSIS — Z9889 Other specified postprocedural states: Secondary | ICD-10-CM

## 2014-09-24 DIAGNOSIS — Z1273 Encounter for screening for malignant neoplasm of ovary: Secondary | ICD-10-CM | POA: Diagnosis not present

## 2014-09-24 NOTE — Progress Notes (Signed)
   Subjective:    Patient ID: Shari Vincent, female    DOB: [birth year], 81 y.o.   MRN: [id number]  HPI  She is here for a post op visit after a d&c for PMB. She has no problems.  Review of Systems     Objective:   Physical Exam Her u/s showed a peri right ovarian solid mass (likely a pedunculated fibroid) [id number] Breathing, conversing, and ambulating normally      Assessment & Plan:  Post op- doing well Peri right ovarian mass - check CA-125 RTC prn

## 2014-09-28 LAB — CA 125: CA 125: 13 U/mL (ref <35)

## 2014-10-06 DIAGNOSIS — Z961 Presence of intraocular lens: Secondary | ICD-10-CM | POA: Diagnosis not present

## 2014-10-06 DIAGNOSIS — H04123 Dry eye syndrome of bilateral lacrimal glands: Secondary | ICD-10-CM | POA: Diagnosis not present

## 2014-10-15 DIAGNOSIS — G500 Trigeminal neuralgia: Secondary | ICD-10-CM | POA: Diagnosis not present

## 2014-10-22 ENCOUNTER — Ambulatory Visit (INDEPENDENT_AMBULATORY_CARE_PROVIDER_SITE_OTHER): Payer: Medicare PPO | Admitting: Primary Care

## 2014-10-22 ENCOUNTER — Encounter: Payer: Self-pay | Admitting: Primary Care

## 2014-10-22 VITALS — BP 122/72 | HR 62 | Temp 97.7°F | Ht 60.0 in | Wt 163.8 lb

## 2014-10-22 DIAGNOSIS — M5441 Lumbago with sciatica, right side: Secondary | ICD-10-CM

## 2014-10-22 DIAGNOSIS — E785 Hyperlipidemia, unspecified: Secondary | ICD-10-CM

## 2014-10-22 DIAGNOSIS — M179 Osteoarthritis of knee, unspecified: Secondary | ICD-10-CM | POA: Diagnosis not present

## 2014-10-22 DIAGNOSIS — M1710 Unilateral primary osteoarthritis, unspecified knee: Secondary | ICD-10-CM

## 2014-10-22 DIAGNOSIS — K589 Irritable bowel syndrome without diarrhea: Secondary | ICD-10-CM

## 2014-10-22 DIAGNOSIS — Z23 Encounter for immunization: Secondary | ICD-10-CM

## 2014-10-22 DIAGNOSIS — K219 Gastro-esophageal reflux disease without esophagitis: Secondary | ICD-10-CM | POA: Diagnosis not present

## 2014-10-22 DIAGNOSIS — M810 Age-related osteoporosis without current pathological fracture: Secondary | ICD-10-CM

## 2014-10-22 DIAGNOSIS — N950 Postmenopausal bleeding: Secondary | ICD-10-CM

## 2014-10-22 NOTE — Assessment & Plan Note (Signed)
Continued discomfort to right knee. Follows with Dr. Aline Brochure and was recommended to have knee surgery. She is reticent at this time. Encouraged patient to wear knee brace for support and to continue exercises at home.

## 2014-10-22 NOTE — Assessment & Plan Note (Signed)
Stable today. No complaints.

## 2014-10-22 NOTE — Assessment & Plan Note (Signed)
Could not tolerate statin. Start aspirin 81 mg daily. Discussed healthy diet and exercise.

## 2014-10-22 NOTE — Assessment & Plan Note (Signed)
Underwent D&C with hysteroscopy. No bleeding since last evaluation with GYN. Denies pain. Abdominal exam unremarkable. She is to notify me if her bleeding returns.

## 2014-10-22 NOTE — Patient Instructions (Signed)
Please notify me if you develop any vaginal bleeding and if you decide to move forward with the Prolia injections for osteoporosis.  Obtain a knee brace for support.   Start daily baby aspirin (81 mg) due to high cholesterol. Work to increase vegetables and limit sweets in your diet.  Follow up in 4 months for re-evaluation.  It was a pleasure to see you today!

## 2014-10-22 NOTE — Assessment & Plan Note (Signed)
Found on Dexa scan in April 2016. She could not tolerate Fosamax. She will consider Prolia injections. Information provided to patient. Discussed her chronic use of omeprazole and bone density loss. She cannot tolerate symptoms of GERD without omeprazole. Continue calcium with vitamin D OTC.

## 2014-10-22 NOTE — Assessment & Plan Note (Signed)
Not a candidate for surgery. Occasional episodes of pain, not bothersome today. Will continue to monitor.

## 2014-10-22 NOTE — Assessment & Plan Note (Signed)
Continued gerd symptoms with alternating zantac and omeprazole. Cannot tolerate being off of omeprazole. Discussed that this medication can cause decreased bone density. Discussed trigger foods for GERD.

## 2014-10-22 NOTE — Progress Notes (Signed)
Subjective:    Patient ID: Shari Vincent, female    DOB: [birth year], 81 y.o.   MRN: [id number]  HPI  Shari Vincent is a 81 year old female who presents today for follow up. She is also due for Prevnar vaccination.   1) Vaginal Bleeding: Evaluated on 08/03/2014 for heavy post menopausal vaginal bleeding with saturation of multiple pads. She was sent for stat ultrasound and GYN visit. On 08/25/2014 She was evaluated by GYN and found 5 cm solid mass representing a fibroid. She underwent D&C and hysteroscopy on 09/03/2014. Since her procedure she has not had any additional bleeding.   2) IBS: Following with Dr. Gala Romney with GI in [place]. Denies problems.   3) Back pain: Underwent MRI in April 2016 with small disc protrusion to L2-L3 and L5-S1. She was evaluated by neurosurgery and was not a candidate for surgery. Hasn't bothered her much lately. She is going through therapy for her knees. She has an injection on May 2016.  4) Hyperlipidemia: She was started on Lipitor in April 2016 and was unable to tolerate. She endorses a fair diet. She does not take a daily aspirin.  Breakfast: Oatmeal, almond milk, banana, boiled egg Lunch: Chicken breast (broiled), chicken salad, sandwiches Dinner: Chicken, sandwiches, fish, limited vegetables, fruit Snack: Granola bar or fig newton, fruit Desserts: Ice cream, fruit. Has limited intake of cakes and pies Beverages: Water, occasional ginger ale, cran-apple juice.  Review of Systems  Respiratory: Negative for shortness of breath.   Cardiovascular: Positive for leg swelling. Negative for chest pain.       Right lower extremity edema per norm. Wears compression stockings. No worsening.  Gastrointestinal: Negative for abdominal pain.  Genitourinary: Negative for vaginal bleeding.  Musculoskeletal: Positive for arthralgias. Negative for back pain.       Right knee pain  Neurological: Negative for dizziness and headaches.       Past Medical History    Diagnosis Date  . High cholesterol   . Acid reflux   . Cancer     Basal Cell on right shoulder  . Kidney stone   . Vertigo   . Diverticulitis   . Colon polyps   . Trigeminal neuralgia   . Trigeminal neuralgia of right side of face   . Arthritis     right knee  . Bulging lumbar disc   . Irritable bowel syndrome (IBS)     Social History   Social History  . Marital Status: Married    Spouse Name: N/A  . Number of Children: N/A  . Years of Education: N/A   Occupational History  . Not on file.   Social History Main Topics  . Smoking status: Former Smoker    Types: Cigarettes  . Smokeless tobacco: Never Used  . Alcohol Use: No  . Drug Use: No  . Sexual Activity: Not on file   Other Topics Concern  . Not on file   Social History Narrative   Married 62 years.   Has three children, three grandchildren, one great grandchildren.   Enjoys going to church, baking, shop.    Past Surgical History  Procedure Laterality Date  . Foot surgery      Left foot, metal plate  . Other surgical history      Uterine Prolapse/Tacking  . Cataract extraction    . Colonoscopy  05/27/2008    [facility]:CWCBJSEGBT rectal polyp,s/p bx/sigmoid diverticula. hyperplastic polyps. benign random bx  . Esophagogastroduodenoscopy  05/27/2008    DVV:OHYWVP/XTGGY  HH  . Prolapsed uterine fibroid ligation      in 1960  . Breast surgery      biopsy in 1990   . Eye surgery    . Hysteroscopy w/d&c N/A 09/03/2014    Procedure: DILATATION AND CURETTAGE /HYSTEROSCOPY;  Surgeon: Donnamae Jude, MD;  Location: [facility];  Service: Gynecology;  Laterality: N/A;  Requesting 09/03/14 @ 2:00p    Family History  Problem Relation Age of Onset  . Diabetes Mother   . Stroke Father   . Heart disease Mother     MI  . Uterine cancer Mother   . Cancer Paternal Grandmother     intestinal  . Stomach cancer Maternal Grandmother   . Colon cancer Neg Hx     Allergies  Allergen Reactions  . Albuterol Rash  . Cefdinir  Diarrhea  . Levofloxacin Nausea Only    Current Outpatient Prescriptions on File Prior to Visit  Medication Sig Dispense Refill  . acetaminophen (TYLENOL) 325 MG tablet Take 650 mg by mouth every 4 (four) hours as needed.     . Cyanocobalamin (VITAMIN B-12 CR PO) Take 1 tablet by mouth daily.     Marland Kitchen gabapentin (NEURONTIN) 100 MG capsule Take 2 capsules (200 mg total) by mouth at bedtime. 60 capsule 0  . hyoscyamine (LEVSIN SL) 0.125 MG SL tablet Place 1 tablet (0.125 mg total) under the tongue 4 (four) times daily -  before meals and at bedtime. prn 30 tablet 0  . meclizine (ANTIVERT) 25 MG tablet Take 25 mg by mouth 3 (three) times daily as needed for dizziness.    . Multiple Vitamins-Minerals (CENTRUM SILVER PO) Take 1 tablet by mouth daily.     Marland Kitchen omeprazole (PRILOSEC) 20 MG capsule Take 20 mg by mouth daily.      . OXcarbazepine (TRILEPTAL) 150 MG tablet Take 75-150 mg by mouth 2 (two) times daily. 1/2 in morning, 1 at bedtime    . Probiotic Product (PROBIOTIC DAILY PO) Take by mouth.     No current facility-administered medications on file prior to visit.    BP 122/72 mmHg  Pulse 62  Temp(Src) 97.7 F (36.5 C) (Oral)  Ht 5' (1.524 m)  Wt 163 lb 12.8 oz (74.299 kg)  BMI 31.99 kg/m2  SpO2 96%    Objective:   Physical Exam  Constitutional: She is oriented to person, place, and time. She appears well-nourished.  Cardiovascular: Normal rate and regular rhythm.   Pulmonary/Chest: Effort normal and breath sounds normal.  Abdominal: Soft. Bowel sounds are normal. There is no tenderness.  Neurological: She is alert and oriented to person, place, and time.  Skin: Skin is warm and dry.  Psychiatric: She has a normal mood and affect.          Assessment & Plan:

## 2014-11-05 DIAGNOSIS — L659 Nonscarring hair loss, unspecified: Secondary | ICD-10-CM | POA: Diagnosis not present

## 2014-11-05 DIAGNOSIS — L821 Other seborrheic keratosis: Secondary | ICD-10-CM | POA: Diagnosis not present

## 2014-12-07 ENCOUNTER — Encounter: Payer: Self-pay | Admitting: Internal Medicine

## 2015-03-01 ENCOUNTER — Ambulatory Visit: Payer: Medicare PPO | Admitting: Primary Care

## 2015-03-08 ENCOUNTER — Telehealth: Payer: Self-pay | Admitting: Primary Care

## 2015-03-08 NOTE — Telephone Encounter (Signed)
I see Shari Vincent is on the schedule for Tuesday at 3:45pm. I only need her for lab work (lipids and vitamin D), not really for an office visit. If she'd like to be seen by me then that's fine, otherwise she just needs labs.  I won't need to see her again until her medicare wellness visit that will be due in early May 2017. If she decides to just do labs, will you please schedule for sometime this week? Will you schedule her for her Wellness visit in May?  Thanks.

## 2015-03-08 NOTE — Telephone Encounter (Signed)
Patient would like to keep the appointment for Tuesday 3:45 pm

## 2015-03-09 ENCOUNTER — Ambulatory Visit (INDEPENDENT_AMBULATORY_CARE_PROVIDER_SITE_OTHER): Payer: Medicare Other | Admitting: Primary Care

## 2015-03-09 VITALS — BP 124/74 | HR 67 | Temp 98.7°F | Ht 60.0 in | Wt 164.1 lb

## 2015-03-09 DIAGNOSIS — M179 Osteoarthritis of knee, unspecified: Secondary | ICD-10-CM | POA: Diagnosis not present

## 2015-03-09 DIAGNOSIS — M5441 Lumbago with sciatica, right side: Secondary | ICD-10-CM

## 2015-03-09 DIAGNOSIS — E785 Hyperlipidemia, unspecified: Secondary | ICD-10-CM

## 2015-03-09 DIAGNOSIS — M1710 Unilateral primary osteoarthritis, unspecified knee: Secondary | ICD-10-CM

## 2015-03-09 NOTE — Progress Notes (Signed)
Subjective:    Patient ID: Shari Vincent, female    DOB: 11-04-1931, 82 y.o.   MRN: [id number]  HPI  Shari Vincent is an 82 year old female who presents today for follow up.  1) Hyperlipidemia: TC and Trigs elevated in April 2016. Prescribed atorvastatin 10 mg, but was unable to tolerate side effects of myalgias. She was initiated on Aspirin 81 mg daily last visit. She is due again for lipid panel but would like to wait until her physical this spring as she has not been eating well since Holidays.   2) Osteoarthritis: Present to right knee. Follows with Ortho who recommended surgery. She was reticent to go through surgery last visit. She's continued to experience knee pain, moreso to the right posterior upper extremity. She is interested in participating "flexogenics" which is a non-surgical treatment for arthritis. Her right hip/lower back, lower extremity continues to bother her, especially in the early morning. She describes her pain as "tightness". Overall her pain is gradually worse. MRI in April with 2 bulging discs. Had injection in Spring of 2016 with improvement. She's not followed up for injections since.   3) Osteoporosis: Present on Bone Density scan in April 2016. Did not want to take Fosamax. She is currently managed on calcium with vitamin D.   Review of Systems  Respiratory: Negative for shortness of breath.   Cardiovascular: Negative for chest pain.  Musculoskeletal: Positive for back pain and arthralgias.       Right lower extremity pain.       Past Medical History  Diagnosis Date  . High cholesterol   . Acid reflux   . Cancer     Basal Cell on right shoulder  . Kidney stone   . Vertigo   . Diverticulitis   . Colon polyps   . Trigeminal neuralgia   . Trigeminal neuralgia of right side of face   . Arthritis     right knee  . Bulging lumbar disc   . Irritable bowel syndrome (IBS)     Social History   Social History  . Marital Status: Married    Spouse  Name: N/A  . Number of Children: N/A  . Years of Education: N/A   Occupational History  . Not on file.   Social History Main Topics  . Smoking status: Former Smoker    Types: Cigarettes  . Smokeless tobacco: Never Used  . Alcohol Use: No  . Drug Use: No  . Sexual Activity: Not on file   Other Topics Concern  . Not on file   Social History Narrative   Married 62 years.   Has three children, three grandchildren, one great grandchildren.   Enjoys going to church, baking, shop.    Past Surgical History  Procedure Laterality Date  . Foot surgery      Left foot, metal plate  . Other surgical history      Uterine Prolapse/Tacking  . Cataract extraction    . Colonoscopy  05/27/2008    [id number] rectal polyp,s/p bx/sigmoid diverticula. hyperplastic polyps. benign random bx  . Esophagogastroduodenoscopy  05/27/2008    [id number] HH  . Prolapsed uterine fibroid ligation      in 1960  . Breast surgery      biopsy in 1990   . Eye surgery    . Hysteroscopy w/d&c N/A 09/03/2014    Procedure: DILATATION AND CURETTAGE /HYSTEROSCOPY;  Surgeon: Donnamae Jude, MD;  Location: [facility];  Service: Gynecology;  Laterality: N/A;  Requesting 09/03/14 @ 2:00p    Family History  Problem Relation Age of Onset  . Diabetes Mother   . Stroke Father   . Heart disease Mother     MI  . Uterine cancer Mother   . Cancer Paternal Grandmother     intestinal  . Stomach cancer Maternal Grandmother   . Colon cancer Neg Hx     Allergies  Allergen Reactions  . Albuterol Rash  . Cefdinir Diarrhea  . Levofloxacin Nausea Only    Current Outpatient Prescriptions on File Prior to Visit  Medication Sig Dispense Refill  . acetaminophen (TYLENOL) 325 MG tablet Take 650 mg by mouth every 4 (four) hours as needed. Reported on 03/09/2015    . Cyanocobalamin (VITAMIN B-12 CR PO) Take 1 tablet by mouth daily.     Marland Kitchen gabapentin (NEURONTIN) 100 MG capsule Take 2 capsules (200 mg total) by mouth at  bedtime. 60 capsule 0  . Multiple Vitamins-Minerals (CENTRUM SILVER PO) Take 1 tablet by mouth daily.     Marland Kitchen omeprazole (PRILOSEC) 20 MG capsule Take 20 mg by mouth daily.      . OXcarbazepine (TRILEPTAL) 150 MG tablet Take 75-150 mg by mouth 2 (two) times daily. Reported on 03/09/2015    . Probiotic Product (PROBIOTIC DAILY PO) Take 1 tablet by mouth daily.     . hyoscyamine (LEVSIN SL) 0.125 MG SL tablet Place 1 tablet (0.125 mg total) under the tongue 4 (four) times daily -  before meals and at bedtime. prn (Patient not taking: Reported on 03/09/2015) 30 tablet 0  . meclizine (ANTIVERT) 25 MG tablet Take 25 mg by mouth 3 (three) times daily as needed for dizziness. Reported on 03/09/2015     No current facility-administered medications on file prior to visit.    BP 124/74 mmHg  Pulse 67  Temp(Src) 98.7 F (37.1 C) (Oral)  Ht 5' (1.524 m)  Wt 164 lb 1.9 oz (74.444 kg)  BMI 32.05 kg/m2  SpO2 98%    Objective:   Physical Exam  Constitutional: She appears well-nourished.  Cardiovascular: Normal rate and regular rhythm.   Pulmonary/Chest: Effort normal and breath sounds normal.  Musculoskeletal:  Decrease in flexion to right knee. Positive straight leg raise to right lower extremity. No obvious hip deformity.   Skin: Skin is warm and dry.          Assessment & Plan:

## 2015-03-09 NOTE — Assessment & Plan Note (Signed)
Continued pain. Is considering participation in [place] as a non surgical option for treatment Surgery recommended by orthopedist.

## 2015-03-09 NOTE — Patient Instructions (Signed)
Call Dr. Donald Pore office to discuss another injection for your back and right leg pain. You pain is likely due to nerve irritation.  You may take the medications discussed all together except for the gabapentin and tizanidine. Ensure you take these medications separately from the other medications on your list.  Please schedule a physical with me in early May 2017. You may also schedule a lab only appointment 3-4 days prior. We will discuss your lab results in detail during your physical. We will check you cholesterol at that point.  Please don't hesitate to call me with any additional questions.  It was a pleasure to see you today!

## 2015-03-09 NOTE — Progress Notes (Signed)
Pre visit review using our clinic review tool, if applicable. No additional management support is needed unless otherwise documented below in the visit note. 

## 2015-03-09 NOTE — Assessment & Plan Note (Signed)
Due for labs now, she would like to defer until physical this spring. Discussed the importance of a healthy diet and regular exercise in order for weight loss and to reduce risk of other medical diseases.

## 2015-03-09 NOTE — Assessment & Plan Note (Signed)
Continues, worse pain to right lower extremity. Known bulging discs to L2-L3, and also L5-S1 via MRI in April 2016. Injection in April 2016 with improvement. Suggested she contact her neurosurgeon for recommendations as the pain has progressed.  Positive straight leg raise. Decrease ROM to right lower extremity.

## 2015-04-22 ENCOUNTER — Telehealth: Payer: Self-pay | Admitting: Primary Care

## 2015-04-22 NOTE — Telephone Encounter (Signed)
Patient Name: Shari Vincent DOB: [birth year] Initial Comment Caller states has starting spotting again, had DNC last year Nurse Assessment Nurse: Ronnald Ramp, RN, Miranda Date/Time (Eastern Time): 04/22/2015 8:45:50 AM Confirm and document reason for call. If symptomatic, describe symptoms. You must click the next button to save text entered. ---Caller states she is having vaginal bleeding for 2 days. She had a D&C last year. Has the patient traveled out of the country within the last 30 days? ---Not Applicable Does the patient have any new or worsening symptoms? ---Yes Will a triage be completed? ---Yes Related visit to physician within the last 2 weeks? ---No Does the PT have any chronic conditions? (i.e. diabetes, asthma, etc.) ---Yes List chronic conditions. ---Arthritis, Sciatica Is this a behavioral health or substance abuse call? ---No Guidelines Guideline Title Affirmed Question Affirmed Notes Vaginal Bleeding - Postmenopausal Postmenopausal vaginal bleeding (all triage questions negative) Final Disposition User See PCP within 2 Ronny Flurry, RN, Miranda Comments Appt scheduled for Monday, March 6th at 10am with [name] Referrals REFERRED TO PCP OFFICE Disagree/Comply: Comply

## 2015-04-22 NOTE — Telephone Encounter (Signed)
[facility] Patient Name: Shari Vincent DOB: [birth year] Initial Comment Caller states her mother has had 2 nose bleeds today. She has 4 this month. There are large clots coming out when it bleeds. Nurse Assessment Nurse: Martyn Ehrich, RN, Felicia Date/Time (Eastern Time): 04/22/2015 5:07:18 PM Confirm and document reason for call. If symptomatic, describe symptoms. You must click the next button to save text entered. ---Pt is having nose bleeds - 2 today and 4 since February including these 2. Has not had one since she was young. No fever and no cough Has the patient traveled out of the country within the last 30 days? ---No Does the patient have any new or worsening symptoms? ---Yes Will a triage be completed? ---Yes Related visit to physician within the last 2 weeks? ---NoDoes the PT have any chronic conditions? (i.e. diabetes, asthma, etc.) ---No Is this a behavioral health or substance abuse call? ---No Guidelines Guideline Title Affirmed Question Affirmed Notes Nosebleed [1] Nosebleed AND [2] bleeding now BUT [3] not using correct technique (all triage questions negative) Final Disposition User [facility] [place], RN, [place] Comments she had a sore on her nose in Jan like inverted pimple but it went away. Told her if it comes back or swelling or redness on nose or face or fever or pus from nose occurs call back. She denies all of these today. Already has an appointment for Monday - suggested she have [name], NP look in her nose and for them report the nose bleeds (which have NOT been difficult to stop) Called back to make appointment as upgrade in next 2 wks bc of their concerns but she already has one for Mon. Disagree/Comply: Comply Call Id: [phone]

## 2015-04-22 NOTE — Telephone Encounter (Signed)
Pt has appt with Gentry Fitz NP on 04/26/15 at 10 AM. I spoke with pt and vaginal bleed started 04/20/15; began as bright red then appeared red with vaginal white discharge; today no discharge just spotting bright red blood. Happened 07/31/2014 had vag bleed for 1 week and then had D&C; no bleeding since D&C until 04/20/15. No abd pain but does have on and off rt lower side pain; pt not sure how long had rt side pain. No fever. Pt has frequency of urine routinely; no pain or burning upon urination. Pt cannot come for appt on 04/23/15 due to other obligations. If pt condition changes or worsens prior to appt pt will go to [facility] or ED.

## 2015-04-23 NOTE — Telephone Encounter (Signed)
Pt has appt with Gentry Fitz NP on 04/26/15 at 10 AM.

## 2015-04-26 ENCOUNTER — Ambulatory Visit (INDEPENDENT_AMBULATORY_CARE_PROVIDER_SITE_OTHER): Payer: Medicare Other | Admitting: Primary Care

## 2015-04-26 ENCOUNTER — Encounter: Payer: Self-pay | Admitting: Primary Care

## 2015-04-26 VITALS — BP 140/78 | HR 59 | Temp 97.5°F | Ht 60.0 in | Wt 162.0 lb

## 2015-04-26 DIAGNOSIS — N950 Postmenopausal bleeding: Secondary | ICD-10-CM | POA: Diagnosis not present

## 2015-04-26 DIAGNOSIS — N939 Abnormal uterine and vaginal bleeding, unspecified: Secondary | ICD-10-CM | POA: Diagnosis not present

## 2015-04-26 LAB — POC URINALSYSI DIPSTICK (AUTOMATED)
Bilirubin, UA: NEGATIVE
Glucose, UA: NEGATIVE
Ketones, UA: NEGATIVE
Leukocytes, UA: NEGATIVE
Nitrite, UA: NEGATIVE
Protein, UA: NEGATIVE
Spec Grav, UA: 1.015
Urobilinogen, UA: NEGATIVE
pH, UA: 6

## 2015-04-26 NOTE — Progress Notes (Signed)
Subjective:    Patient ID: Shari Vincent, female    DOB: 10/31/1931, 82 y.o.   MRN: [id number]  HPI  Shari Vincent is an 82 year old female who presents today with a chief complaint of bleeding.  1) Vaginal Bleeding: Prior history of in Summer 2016. She was found to have moderate bleeding during examination and was sent to GYN for further evaluation. She underwent D&C in July 2016 due to thickened uterine lining and removal of uterine mass. She has a family history of uterine cancer.   Her vaginal bleeding returned Wednesday last week and is lighter overall than her experience last summer. Since Wednesday last week she's noticed the bleeding becoming lighter but has continued saturate numerous pads daily. She also reports right lower pelvic pressure intermittently for months. Denies dysuria, flank pain, vaginal discharge, fevers, fatigue, frequency.  2) Epistaxis: Present to left nares. 4-5 epidoes of bleeding in February 2017. She's using saline nasal spray every morning and has been doing so for the past several months due to rhinorrhea and PND. She's noticed where she will blow out clots of blood when blowing her nose recently. Each episode will last about 5-10 minutes at a time. She is managed on daily aspirin 81 mg, no other blood thinners. Denies recent injury or trauma.  Review of Systems  HENT: Positive for nosebleeds, postnasal drip and rhinorrhea. Negative for sinus pressure.   Respiratory: Negative for cough.   Genitourinary: Positive for vaginal bleeding and pelvic pain. Negative for dysuria, frequency, hematuria, flank pain and vaginal discharge.  Neurological: Negative for dizziness and weakness.       Past Medical History  Diagnosis Date  . High cholesterol   . Acid reflux   . Cancer ([place])     Basal Cell on right shoulder  . Kidney stone   . Vertigo   . Diverticulitis   . Colon polyps   . Trigeminal neuralgia   . Trigeminal neuralgia of right side of face   .  Arthritis     right knee  . Bulging lumbar disc   . Irritable bowel syndrome (IBS)     Social History   Social History  . Marital Status: Married    Spouse Name: N/A  . Number of Children: N/A  . Years of Education: N/A   Occupational History  . Not on file.   Social History Main Topics  . Smoking status: Former Smoker    Types: Cigarettes  . Smokeless tobacco: Never Used  . Alcohol Use: No  . Drug Use: No  . Sexual Activity: Not on file   Other Topics Concern  . Not on file   Social History Narrative   Married 62 years.   Has three children, three grandchildren, one great grandchildren.   Enjoys going to church, baking, shop.    Past Surgical History  Procedure Laterality Date  . Foot surgery      Left foot, metal plate  . Other surgical history      Uterine Prolapse/Tacking  . Cataract extraction    . Colonoscopy  05/27/2008    [id number] rectal polyp,s/p bx/sigmoid diverticula. hyperplastic polyps. benign random bx  . Esophagogastroduodenoscopy  05/27/2008    [id number] HH  . Prolapsed uterine fibroid ligation      in 1960  . Breast surgery      biopsy in 1990   . Eye surgery    . Hysteroscopy w/d&c N/A 09/03/2014    Procedure: DILATATION AND CURETTAGE /HYSTEROSCOPY;  Surgeon: Donnamae Jude, MD;  Location: [facility];  Service: Gynecology;  Laterality: N/A;  Requesting 09/03/14 @ 2:00p    Family History  Problem Relation Age of Onset  . Diabetes Mother   . Stroke Father   . Heart disease Mother     MI  . Uterine cancer Mother   . Cancer Paternal Grandmother     intestinal  . Stomach cancer Maternal Grandmother   . Colon cancer Neg Hx     Allergies  Allergen Reactions  . Albuterol Rash  . Cefdinir Diarrhea  . Levofloxacin Nausea Only    Current Outpatient Prescriptions on File Prior to Visit  Medication Sig Dispense Refill  . acetaminophen (TYLENOL) 325 MG tablet Take 650 mg by mouth every 4 (four) hours as needed. Reported on  03/09/2015    . calcium citrate-vitamin D (CITRACAL+D) 315-200 MG-UNIT tablet Take 1 tablet by mouth daily.    . Cyanocobalamin (VITAMIN B-12 CR PO) Take 1 tablet by mouth daily.     Marland Kitchen gabapentin (NEURONTIN) 100 MG capsule Take 2 capsules (200 mg total) by mouth at bedtime. 60 capsule 0  . meclizine (ANTIVERT) 25 MG tablet Take 25 mg by mouth 3 (three) times daily as needed for dizziness. Reported on 03/09/2015    . Multiple Vitamins-Minerals (CENTRUM SILVER PO) Take 1 tablet by mouth daily.     Marland Kitchen omeprazole (PRILOSEC) 20 MG capsule Take 20 mg by mouth daily.      . OXcarbazepine (TRILEPTAL) 150 MG tablet Take 75-150 mg by mouth 2 (two) times daily. Reported on 03/09/2015    . Probiotic Product (PROBIOTIC DAILY PO) Take 1 tablet by mouth daily.     . hyoscyamine (LEVSIN SL) 0.125 MG SL tablet Place 1 tablet (0.125 mg total) under the tongue 4 (four) times daily -  before meals and at bedtime. prn (Patient not taking: Reported on 03/09/2015) 30 tablet 0   No current facility-administered medications on file prior to visit.    BP 140/78 mmHg  Pulse 59  Temp(Src) 97.5 F (36.4 C) (Oral)  Ht 5' (1.524 m)  Wt 162 lb (73.483 kg)  BMI 31.64 kg/m2  SpO2 98%    Objective:   Physical Exam  Constitutional: She appears well-nourished.  HENT:  Nose: Mucosal edema present. No rhinorrhea. No epistaxis. Right sinus exhibits no maxillary sinus tenderness and no frontal sinus tenderness. Left sinus exhibits no maxillary sinus tenderness and no frontal sinus tenderness.  Mouth/Throat: Oropharynx is clear and moist.  Neck: Neck supple.  Cardiovascular: Normal rate and regular rhythm.   Pulmonary/Chest: Effort normal and breath sounds normal.  Genitourinary: Cervix exhibits no motion tenderness. There is bleeding in the vagina. No vaginal discharge found.  Moderate amount of vaginal bleeding. No obvious source of bleeding during pelvic exam.  Skin: Skin is warm and dry.          Assessment & Plan:    Epistaxis:  Present 4-5 times during February. Also notes rhinorrhea and PND for which she's been using nasal sprays. Exam without bleeding or obvious cause. Will have her stop nasal sprays, start Zyrtec HS for allergy symptoms. Return precautions provided.

## 2015-04-26 NOTE — Patient Instructions (Addendum)
Stop using the saline nasal spray in your nose. Start Zyrtec at bedtime to help with runny nose and throat drainage. The saline spray is likely causing irritation/dryness to your nasal passage.   Your urine test is negative for infection.  The bleeding seems to be originating from the vagina. I've sent off swabs to test for bacteria. I highly recommend you follow up with GYN again for these symptoms. Please schedule an appointment at your earliest convenience.   It was a pleasure to see you today!

## 2015-04-26 NOTE — Assessment & Plan Note (Signed)
Present again starting 04/21/15. Moderate amount of vaginal bleeding noted during exam. UA negative for infection, will send wet prep for testing. No obvious cause noted on exam, and given her FH of uterine cancer and previous incidence last June, will send to GYN for evaluation.  She will schedule an appointment soon.

## 2015-04-26 NOTE — Progress Notes (Signed)
Pre visit review using our clinic review tool, if applicable. No additional management support is needed unless otherwise documented below in the visit note. 

## 2015-04-27 LAB — WET PREP BY MOLECULAR PROBE
Candida species: NEGATIVE
Gardnerella vaginalis: NEGATIVE
Trichomonas vaginosis: NEGATIVE

## 2015-05-13 ENCOUNTER — Ambulatory Visit (INDEPENDENT_AMBULATORY_CARE_PROVIDER_SITE_OTHER): Payer: Medicare Other | Admitting: Family Medicine

## 2015-05-13 ENCOUNTER — Encounter: Payer: Self-pay | Admitting: Family Medicine

## 2015-05-13 ENCOUNTER — Other Ambulatory Visit (HOSPITAL_COMMUNITY)
Admission: RE | Admit: 2015-05-13 | Discharge: 2015-05-13 | Disposition: A | Discharge status: Home / Self Care | Payer: Medicare Other | Source: Ambulatory Visit | Attending: Family Medicine | Admitting: Family Medicine

## 2015-05-13 VITALS — BP 136/93 | HR 74 | Wt 162.0 lb

## 2015-05-13 DIAGNOSIS — Z1151 Encounter for screening for human papillomavirus (HPV): Secondary | ICD-10-CM | POA: Insufficient documentation

## 2015-05-13 DIAGNOSIS — R1900 Intra-abdominal and pelvic swelling, mass and lump, unspecified site: Secondary | ICD-10-CM | POA: Diagnosis not present

## 2015-05-13 DIAGNOSIS — Z124 Encounter for screening for malignant neoplasm of cervix: Secondary | ICD-10-CM

## 2015-05-13 DIAGNOSIS — N950 Postmenopausal bleeding: Secondary | ICD-10-CM

## 2015-05-13 DIAGNOSIS — Z01419 Encounter for gynecological examination (general) (routine) without abnormal findings: Secondary | ICD-10-CM | POA: Insufficient documentation

## 2015-05-13 NOTE — Assessment & Plan Note (Signed)
Re-eval with pelvic sono

## 2015-05-13 NOTE — Assessment & Plan Note (Signed)
Previously with negative EMB and D and C < 1 year ago.  Declines repeat office EMB today. Prior thickened endometrium was not appreciated on hysteroscopy. Depending on results may want to pursue hysterectomy.  Will await results of sonogram before decision made.

## 2015-05-13 NOTE — Progress Notes (Signed)
    Subjective:    Patient ID: Shari Vincent is a 82 y.o. female presenting with Discuss Menopausal Bleeding and Vaginal Discharge  on 05/13/2015  HPI: W/u for postmenopausal bleeding done in 08/2014 with negative office EMB and D and C hysteroscopy. Both with benign pathology. Now with bleeding in February 2/28-3/10. Had to change pads a few times/day. None prior to that since surgery. Following this, began having vaginal discharge which was foul smelling. This stopped on 3/19.  Review of Systems  Constitutional: Negative for fever and chills.  Respiratory: Negative for shortness of breath.   Cardiovascular: Negative for chest pain.  Gastrointestinal: Negative for nausea, vomiting and abdominal pain.  Genitourinary: Negative for dysuria.  Skin: Negative for rash.      Objective:    BP 136/93 mmHg  Pulse 74  Wt 162 lb (73.483 kg) Physical Exam  Constitutional: She appears well-developed and well-nourished. No distress.  HENT:  Head: Normocephalic and atraumatic.  Eyes: No scleral icterus.  Neck: Neck supple.  Cardiovascular: Normal rate.   Pulmonary/Chest: Effort normal.  Abdominal: Soft. There is no tenderness.  Genitourinary:  Atrophic vagina, normal appearing cervix, uterus feels small and there is no obvious adnexal tenderness or mass.        Assessment & Plan:   Problem List Items Addressed This Visit      Unprioritized   PMB (postmenopausal bleeding) - Primary    Previously with negative EMB and D and C < 1 year ago.  Declines repeat office EMB today. Prior thickened endometrium was not appreciated on hysteroscopy. Depending on results may want to pursue hysterectomy.  Will await results of sonogram before decision made.      Relevant Orders   US Pelvis Complete   US Transvaginal Non-OB   Pelvic mass in female    Re-eval with pelvic sono      Relevant Orders   US Pelvis Complete   US Transvaginal Non-OB    Other Visit Diagnoses    Screening for  cervical cancer        Relevant Orders    Cytology - PAP       Return in about 4 weeks (around 06/10/2015).  Emili Mcloughlin S 05/13/2015 11:10 AM

## 2015-05-17 LAB — CYTOLOGY - PAP

## 2015-05-19 ENCOUNTER — Encounter: Payer: Self-pay | Admitting: Primary Care

## 2015-05-19 ENCOUNTER — Other Ambulatory Visit: Payer: Self-pay | Admitting: Primary Care

## 2015-05-19 ENCOUNTER — Ambulatory Visit (HOSPITAL_COMMUNITY)
Admission: RE | Admit: 2015-05-19 | Discharge: 2015-05-19 | Disposition: A | Discharge status: Home / Self Care | Payer: Medicare Other | Source: Ambulatory Visit | Attending: Family Medicine | Admitting: Family Medicine

## 2015-05-19 DIAGNOSIS — R1900 Intra-abdominal and pelvic swelling, mass and lump, unspecified site: Secondary | ICD-10-CM

## 2015-05-19 DIAGNOSIS — R040 Epistaxis: Secondary | ICD-10-CM

## 2015-05-19 DIAGNOSIS — N950 Postmenopausal bleeding: Secondary | ICD-10-CM | POA: Diagnosis present

## 2015-06-10 ENCOUNTER — Ambulatory Visit (INDEPENDENT_AMBULATORY_CARE_PROVIDER_SITE_OTHER): Payer: Medicare Other | Admitting: Family Medicine

## 2015-06-10 ENCOUNTER — Encounter: Payer: Self-pay | Admitting: Family Medicine

## 2015-06-10 VITALS — BP 114/68 | HR 72 | Resp 16 | Ht 60.0 in | Wt 162.0 lb

## 2015-06-10 DIAGNOSIS — R1900 Intra-abdominal and pelvic swelling, mass and lump, unspecified site: Secondary | ICD-10-CM | POA: Diagnosis not present

## 2015-06-10 DIAGNOSIS — N950 Postmenopausal bleeding: Secondary | ICD-10-CM | POA: Diagnosis not present

## 2015-06-10 NOTE — Progress Notes (Signed)
    Subjective:    Patient ID: Shari Vincent is a 82 y.o. female presenting with Follow-up  on 06/10/2015  HPI: Here to f/u postmenopausal bleeding. Had pelvic sonogram which showed 7 mm thickness which previously measured 11 mm. Has previously had a D and C with hysteroscopy with benign pathology and negative EMB. Previous ovarian mass noted and measured smaller than previous with normal CA 125. Returned with bleeding again. She declined repeat endometrial sampling in office and wanted a hysterectomy. She is no longer bleeding vaginally. Mother had endometrial cancer and radiation treatment.   Review of Systems  Constitutional: Negative for fever and chills.  Respiratory: Negative for shortness of breath.   Cardiovascular: Negative for chest pain.  Gastrointestinal: Negative for nausea, vomiting and abdominal pain.  Genitourinary: Negative for dysuria.  Skin: Negative for rash.      Objective:    BP 114/68 mmHg  Pulse 72  Resp 16  Ht 5' (1.524 m)  Wt 162 lb (73.483 kg)  BMI 31.64 kg/m2 Physical Exam  Constitutional: She is oriented to person, place, and time. She appears well-developed and well-nourished. No distress.  HENT:  Head: Normocephalic and atraumatic.  Eyes: No scleral icterus.  Neck: Neck supple.  Cardiovascular: Normal rate.   Pulmonary/Chest: Effort normal.  Abdominal: Soft.  Neurological: She is alert and oriented to person, place, and time.  Skin: Skin is warm and dry.  Psychiatric: She has a normal mood and affect.        Assessment & Plan:   Problem List Items Addressed This Visit      Unprioritized   PMB (postmenopausal bleeding)    Normal pathology x 2 9 months ago. Offered D and C with hysteroscopy, possible IUD insertion vs. Hysterectomy prefer [facility]. Risks include but are not limited to bleeding, infection, injury to surrounding structures, including bowel, bladder and ureters, blood clots, and death.  She and daughter were apprised of the  options and risks of surgery. They are considering doing nothing right now. Risk of undiagnosed cancer and options if missed discussed. She is 82. They will discuss and consider and get back with [place] regarding which option she would like. Appointment made for 3 months.       Pelvic mass in female - Primary    Likely ovarian fibroma--no progression but would remove if chose hysterectomy          Total face-to-face time with patient: 25 minutes. Over 50% of encounter was spent on counseling and coordination of care. Return in about 4 weeks (around 07/08/2015), or if symptoms worsen or fail to improve, for a follow-up.  Eluterio Seymour S 06/10/2015 10:36 AM

## 2015-06-10 NOTE — Assessment & Plan Note (Signed)
Normal pathology x 2 9 months ago. Offered D and C with hysteroscopy, possible IUD insertion vs. Hysterectomy prefer [facility]. Risks include but are not limited to bleeding, infection, injury to surrounding structures, including bowel, bladder and ureters, blood clots, and death.  She and daughter were apprised of the options and risks of surgery. They are considering doing nothing right now. Risk of undiagnosed cancer and options if missed discussed. She is 84. They will discuss and consider and get back with [place] regarding which option she would like. Appointment made for 3 months.

## 2015-06-10 NOTE — Patient Instructions (Addendum)
Hysterectomy Information  A hysterectomy is a surgery in which your uterus is removed. This surgery may be done to treat various medical problems. After the surgery, you will no longer have menstrual periods. The surgery will also make you unable to become pregnant (sterile). The fallopian tubes and ovaries can be removed (bilateral salpingo-oophorectomy) during this surgery as well.  REASONS FOR A HYSTERECTOMY  Persistent, abnormal bleeding.  Lasting (chronic) pelvic pain or infection.  The lining of the uterus (endometrium) starts growing outside the uterus (endometriosis).  The endometrium starts growing in the muscle of the uterus (adenomyosis).  The uterus falls down into the vagina (pelvic organ prolapse).  Noncancerous growths in the uterus (uterine fibroids) that cause symptoms.  Precancerous cells.  Cervical cancer or uterine cancer. TYPES OF HYSTERECTOMIES  Supracervical hysterectomy--In this type, the top part of the uterus is removed, but not the cervix.  Total hysterectomy--The uterus and cervix are removed.  Radical hysterectomy--The uterus, the cervix, and the fibrous tissue that holds the uterus in place in the pelvis (parametrium) are removed. WAYS A HYSTERECTOMY CAN BE PERFORMED  Abdominal hysterectomy--A large surgical cut (incision) is made in the abdomen. The uterus is removed through this incision.  Vaginal hysterectomy--An incision is made in the vagina. The uterus is removed through this incision. There are no abdominal incisions.  Conventional laparoscopic hysterectomy--Three or four small incisions are made in the abdomen. A thin, lighted tube with a camera (laparoscope) is inserted into one of the incisions. Other tools are put through the other incisions. The uterus is cut into small pieces. The small pieces are removed through the incisions, or they are removed through the vagina.  Laparoscopically assisted vaginal hysterectomy (LAVH)--Three or four  small incisions are made in the abdomen. Part of the surgery is performed laparoscopically and part vaginally. The uterus is removed through the vagina.  Robot-assisted laparoscopic hysterectomy--A laparoscope and other tools are inserted into 3 or 4 small incisions in the abdomen. A computer-controlled device is used to give the surgeon a 3D image and to help control the surgical instruments. This allows for more precise movements of surgical instruments. The uterus is cut into small pieces and removed through the incisions or removed through the vagina. RISKS AND COMPLICATIONS  Possible complications associated with this procedure include:  Bleeding and risk of blood transfusion. Tell your health care provider if you do not want to receive any blood products.  Blood clots in the legs or lung.  Infection.  Injury to surrounding organs.  Problems or side effects related to anesthesia.  Conversion to an abdominal hysterectomy from one of the other techniques. WHAT TO EXPECT AFTER A HYSTERECTOMY  You will be given pain medicine.  You will need to have someone with you for the first 3-5 days after you go home.  You will need to follow up with your surgeon in 2-4 weeks after surgery to evaluate your progress.  You may have early menopause symptoms such as hot flashes, night sweats, and insomnia.  If you had a hysterectomy for a problem that was not cancer or not a condition that could lead to cancer, then you no longer need Pap tests. However, even if you no longer need a Pap test, a regular exam is a good idea to make sure no other problems are starting.   This information is not intended to replace advice given to you by your health care provider. Make sure you discuss any questions you have with your health care   provider.   Document Released: 08/02/2000 Document Revised: 11/27/2012 Document Reviewed: 10/14/2012 Elsevier Interactive Patient Education 2016 [employer]. Levonorgestrel  intrauterine device (IUD) What is this medicine? LEVONORGESTREL IUD (LEE voe nor jes trel) is a contraceptive (birth control) device. The device is placed inside the uterus by a healthcare professional. It is used to prevent pregnancy and can also be used to treat heavy bleeding that occurs during your period. Depending on the device, it can be used for 3 to 5 years. This medicine may be used for other purposes; ask your health care provider or pharmacist if you have questions. What should I tell my health care provider before I take this medicine? They need to know if you have any of these conditions: -abnormal Pap smear -cancer of the breast, uterus, or cervix -diabetes -endometritis -genital or pelvic infection now or in the past -have more than one sexual partner or your partner has more than one partner -heart disease -history of an ectopic or tubal pregnancy -immune system problems -IUD in place -liver disease or tumor -problems with blood clots or take blood-thinners -use intravenous drugs -uterus of unusual shape -vaginal bleeding that has not been explained -an unusual or allergic reaction to levonorgestrel, other hormones, silicone, or polyethylene, medicines, foods, dyes, or preservatives -pregnant or trying to get pregnant -breast-feeding How should I use this medicine? This device is placed inside the uterus by a health care professional. Talk to your pediatrician regarding the use of this medicine in children. Special care may be needed. Overdosage: If you think you have taken too much of this medicine contact a poison control center or emergency room at once. NOTE: This medicine is only for you. Do not share this medicine with others. What if I miss a dose? This does not apply. What may interact with this medicine? Do not take this medicine with any of the following medications: -amprenavir -bosentan -fosamprenavir This medicine may also interact with the following  medications: -aprepitant -barbiturate medicines for inducing sleep or treating seizures -bexarotene -griseofulvin -medicines to treat seizures like carbamazepine, ethotoin, felbamate, oxcarbazepine, phenytoin, topiramate -modafinil -pioglitazone -rifabutin -rifampin -rifapentine -some medicines to treat HIV infection like atazanavir, indinavir, lopinavir, nelfinavir, tipranavir, ritonavir -St. John's wort -warfarin This list may not describe all possible interactions. Give your health care provider a list of all the medicines, herbs, non-prescription drugs, or dietary supplements you use. Also tell them if you smoke, drink alcohol, or use illegal drugs. Some items may interact with your medicine. What should I watch for while using this medicine? Visit your doctor or health care professional for regular check ups. See your doctor if you or your partner has sexual contact with others, becomes HIV positive, or gets a sexual transmitted disease. This product does not protect you against HIV infection (AIDS) or other sexually transmitted diseases. You can check the placement of the IUD yourself by reaching up to the top of your vagina with clean fingers to feel the threads. Do not pull on the threads. It is a good habit to check placement after each menstrual period. Call your doctor right away if you feel more of the IUD than just the threads or if you cannot feel the threads at all. The IUD may come out by itself. You may become pregnant if the device comes out. If you notice that the IUD has come out use a backup birth control method like condoms and call your health care provider. Using tampons will not change the position of  the IUD and are okay to use during your period. What side effects may I notice from receiving this medicine? Side effects that you should report to your doctor or health care professional as soon as possible: -allergic reactions like skin rash, itching or hives, swelling  of the face, lips, or tongue -fever, flu-like symptoms -genital sores -high blood pressure -no menstrual period for 6 weeks during use -pain, swelling, warmth in the leg -pelvic pain or tenderness -severe or sudden headache -signs of pregnancy -stomach cramping -sudden shortness of breath -trouble with balance, talking, or walking -unusual vaginal bleeding, discharge -yellowing of the eyes or skin Side effects that usually do not require medical attention (report to your doctor or health care professional if they continue or are bothersome): -acne -breast pain -change in sex drive or performance -changes in weight -cramping, dizziness, or faintness while the device is being inserted -headache -irregular menstrual bleeding within first 3 to 6 months of use -nausea This list may not describe all possible side effects. Call your doctor for medical advice about side effects. You may report side effects to FDA at 1-800-FDA-1088. Where should I keep my medicine? This does not apply. NOTE: This sheet is a summary. It may not cover all possible information. If you have questions about this medicine, talk to your doctor, pharmacist, or health care provider.    2016, Elsevier/Gold Standard. (2011-03-09 13:54:04)

## 2015-06-10 NOTE — Assessment & Plan Note (Signed)
Likely ovarian fibroma--no progression but would remove if chose hysterectomy

## 2015-06-28 ENCOUNTER — Encounter: Payer: Medicare Other | Admitting: Primary Care

## 2015-07-07 ENCOUNTER — Encounter: Payer: Medicare Other | Admitting: Primary Care

## 2015-07-07 ENCOUNTER — Ambulatory Visit (INDEPENDENT_AMBULATORY_CARE_PROVIDER_SITE_OTHER): Payer: Medicare Other | Admitting: Primary Care

## 2015-07-07 VITALS — BP 124/70 | HR 69 | Temp 97.9°F | Ht 60.0 in | Wt 166.4 lb

## 2015-07-07 DIAGNOSIS — Z0000 Encounter for general adult medical examination without abnormal findings: Secondary | ICD-10-CM | POA: Diagnosis not present

## 2015-07-07 DIAGNOSIS — M5416 Radiculopathy, lumbar region: Secondary | ICD-10-CM

## 2015-07-07 DIAGNOSIS — IMO0001 Reserved for inherently not codable concepts without codable children: Secondary | ICD-10-CM

## 2015-07-07 DIAGNOSIS — E559 Vitamin D deficiency, unspecified: Secondary | ICD-10-CM

## 2015-07-07 DIAGNOSIS — K589 Irritable bowel syndrome without diarrhea: Secondary | ICD-10-CM

## 2015-07-07 DIAGNOSIS — E785 Hyperlipidemia, unspecified: Secondary | ICD-10-CM | POA: Diagnosis not present

## 2015-07-07 DIAGNOSIS — R7303 Prediabetes: Secondary | ICD-10-CM | POA: Diagnosis not present

## 2015-07-07 DIAGNOSIS — K219 Gastro-esophageal reflux disease without esophagitis: Secondary | ICD-10-CM

## 2015-07-07 DIAGNOSIS — M810 Age-related osteoporosis without current pathological fracture: Secondary | ICD-10-CM

## 2015-07-07 DIAGNOSIS — M5441 Lumbago with sciatica, right side: Secondary | ICD-10-CM

## 2015-07-07 DIAGNOSIS — N950 Postmenopausal bleeding: Secondary | ICD-10-CM

## 2015-07-07 MED ORDER — GABAPENTIN 100 MG PO CAPS: 100.0000 mg | ORAL_CAPSULE | Freq: Every day | ORAL | Status: DC

## 2015-07-07 NOTE — Progress Notes (Signed)
Patient ID: Shari Vincent, female   DOB: [birth year], 82 y.o.   MRN: [id number]  HPI: Ms. Shari Vincent is an 82 year old female who presents today for her annual wellness visit.  Past Medical History  Diagnosis Date  . High cholesterol   . Acid reflux   . Cancer ([place])     Basal Cell on right shoulder  . Kidney stone   . Vertigo   . Diverticulitis   . Colon polyps   . Trigeminal neuralgia   . Trigeminal neuralgia of right side of face   . Arthritis     right knee  . Bulging lumbar disc   . Irritable bowel syndrome (IBS)     Current Outpatient Prescriptions  Medication Sig Dispense Refill  . acetaminophen (TYLENOL) 325 MG tablet Take 650 mg by mouth every 4 (four) hours as needed. Reported on 05/13/2015    . calcium citrate-vitamin D (CITRACAL+D) 315-200 MG-UNIT tablet Take 1 tablet by mouth daily.    . Cyanocobalamin (VITAMIN B-12 CR PO) Take 1 tablet by mouth daily.     Marland Kitchen gabapentin (NEURONTIN) 100 MG capsule Take 2 capsules (200 mg total) by mouth at bedtime. 60 capsule 0  . gentamicin ointment (GARAMYCIN) 0.1 %     . hyoscyamine (LEVSIN SL) 0.125 MG SL tablet Place 1 tablet (0.125 mg total) under the tongue 4 (four) times daily -  before meals and at bedtime. prn 30 tablet 0  . meclizine (ANTIVERT) 25 MG tablet Take 25 mg by mouth 3 (three) times daily as needed for dizziness. Reported on 05/13/2015    . Multiple Vitamins-Minerals (CENTRUM SILVER PO) Take 1 tablet by mouth daily.     Marland Kitchen omeprazole (PRILOSEC) 20 MG capsule Take 20 mg by mouth daily.      . OXcarbazepine (TRILEPTAL) 150 MG tablet Take 75-150 mg by mouth 2 (two) times daily. Reported on 03/09/2015    . Probiotic Product (PROBIOTIC DAILY PO) Take 1 tablet by mouth daily.      No current facility-administered medications for this visit.    Allergies  Allergen Reactions  . Albuterol Rash  . Cefdinir Diarrhea  . Levofloxacin Nausea Only    Family History  Problem Relation Age of Onset  . Diabetes Mother   . Stroke  Father   . Heart disease Mother     MI  . Uterine cancer Mother   . Cancer Paternal Grandmother     intestinal  . Stomach cancer Maternal Grandmother   . Colon cancer Neg Hx     Social History   Social History  . Marital Status: Married    Spouse Name: N/A  . Number of Children: N/A  . Years of Education: N/A   Occupational History  . Not on file.   Social History Main Topics  . Smoking status: Former Smoker    Types: Cigarettes  . Smokeless tobacco: Never Used  . Alcohol Use: No  . Drug Use: No  . Sexual Activity: Not on file   Other Topics Concern  . Not on file   Social History Narrative   Married 62 years.   Has three children, three grandchildren, one great grandchildren.   Enjoys going to church, baking, shop.    Hospitiliaztions: None.   Health Maintenance:    Flu: Does not obtain flu shots.  Tetanus: Completed in 2014  Pneumovax: Completed  Prevnar: Completed in September 2016.  Zostavax: Completed  Bone Density: Completed in 2016 with osteoporosis.  Colonoscopy: Completed  in 2010  Eye Doctor: Completed in 2016, due again this summer.  Dental Exam: Completes annually  Mammogram: Completed in January 2016      Providers: [name], PCP; Dr Kennon Rounds, GYN; Dr, Aline Brochure, Orthopedics; Dr. Bobby Rumpf and Rourk, GI; Dr. Tami Ribas, ENT   I have personally reviewed and have noted: 1. The patient's medical and social history 2. Their use of alcohol, tobacco or illicit drugs 3. Their current medications and supplements 4. The patient's functional ability including ADL's, fall risks, home safety risks and  hearing or visual impairment. 5. Diet and physical activities 6. Evidence for depression or mood disorder  Subjective:   Review of Systems:   Constitutional: Denies fever, malaise, fatigue, headache or abrupt weight changes.  HEENT: Denies eye pain, eye redness, ear pain, ringing in the ears, wax buildup, runny nose, nasal congestion, bloody nose, or  sore throat. Respiratory: Denies difficulty breathing, shortness of breath, cough or sputum production.   Cardiovascular: Denies chest pain, chest tightness, palpitations or swelling in the hands or feet.  Gastrointestinal: Denies abdominal pain, bloating, constipation, diarrhea or blood in the stool.  GU: Denies urgency, frequency, pain with urination, burning sensation, blood in urine, odor or discharge. History of vaginal bleeding, D&C in summer of 2016. Has an appointment in June 2017 with GYN.  Musculoskeletal: Denies decrease in range of motion, difficulty with gait, muscle pain or joint pain and swelling.  Skin: Denies redness, rashes, lesions or ulcercations.  Neurological: Denies dizziness, difficulty with memory, difficulty with speech or problems with balance and coordination.   No other specific complaints in a complete review of systems (except as listed in HPI above).  Objective:  PE:   There were no vitals taken for this visit. Wt Readings from Last 3 Encounters:  06/10/15 162 lb (73.483 kg)  05/13/15 162 lb (73.483 kg)  04/26/15 162 lb (73.483 kg)    General: Appears their stated age, well developed, well nourished in NAD. Skin: Warm, dry and intact. No rashes, lesions or ulcerations noted. HEENT: Head: normal shape and size; Eyes: sclera white, no icterus, conjunctiva pink, PERRLA and EOMs intact; Ears: Tm's gray and intact, normal light reflex; Nose: mucosa pink and moist, septum midline; Throat/Mouth: Teeth present, mucosa pink and moist, no exudate, lesions or ulcerations noted.  Neck: Normal range of motion. Neck supple, trachea midline. No massses, lumps or thyromegaly present.  Cardiovascular: Normal rate and rhythm. S1,S2 noted.  No murmur, rubs or gallops noted. No JVD or BLE edema. No carotid bruits noted. Pulmonary/Chest: Normal effort and positive vesicular breath sounds. No respiratory distress. No wheezes, rales or ronchi noted.  Abdomen: Soft and nontender.  Normal bowel sounds, no bruits noted. No distention or masses noted. Liver, spleen and kidneys non palpable. Musculoskeletal: Normal range of motion. No signs of joint swelling. No difficulty with gait.  Neurological: Alert and oriented. Cranial nerves II-XII intact. Coordination normal. +DTRs bilaterally. Psychiatric: Mood and affect normal. Behavior is normal. Judgment and thought content normal.     BMET    Component Value Date/Time   NA 138 06/17/2014 1352   K 4.1 06/17/2014 1352   CL 104 06/17/2014 1352   CO2 29 06/17/2014 1352   GLUCOSE 84 06/17/2014 1352   BUN 13 06/17/2014 1352   CREATININE 0.80 09/09/2014 1331   CALCIUM 9.8 06/17/2014 1352   GFRNONAA >60 09/05/2010 0813   GFRAA >60 09/05/2010 0813    Lipid Panel     Component Value Date/Time   CHOL 265* 06/17/2014  1352   TRIG 385.0* 06/17/2014 1352   HDL 35.50* 06/17/2014 1352   CHOLHDL 7 06/17/2014 1352   VLDL 77.0* 06/17/2014 1352    CBC    Component Value Date/Time   WBC 7.9 08/27/2014 1035   RBC 4.40 08/27/2014 1035   HGB 13.5 08/27/2014 1035   HCT 40.0 08/27/2014 1035   PLT 186 08/27/2014 1035   MCV 90.9 08/27/2014 1035   MCH 30.7 08/27/2014 1035   MCHC 33.8 08/27/2014 1035   RDW 14.1 08/27/2014 1035   LYMPHSABS 2.8 06/17/2014 1352   MONOABS 0.7 06/17/2014 1352   EOSABS 0.1 06/17/2014 1352   BASOSABS 0.0 06/17/2014 1352    Hgb A1C Lab Results  Component Value Date   HGBA1C 6.0 06/17/2014      Assessment and Plan:   Medicare Annual Wellness Visit:  Diet: Endorses a fair diet. Breakfast: Eggs with butter, toast, oatmeal Lunch: Kidney beans, corn, chicken, chicken salad, eats out Dinner: Chicken, beans, seafood, restaurant Snacks: None Desserts: Once weekly on average Beverages: Water, arnold palmer Physical activity: Active at home, does not currently exercise Depression/mood screen: Negative Hearing: Intact to whispered voice Visual acuity: Grossly normal, performs annual eye exam   ADLs: Capable Fall risk: None Home safety: Good Cognitive evaluation: Intact to orientation, naming, recall and repetition EOL planning: She has advanced directives, will bring a copy. DNR, will bring copy.  Preventative Medicine: All vaccinations are up to date. Colonoscopy, pap, mammogram up to date. Discussed the importance of a healthy diet and regular exercise in order for weight loss and to reduce risk of other medical diseases. She is to bring in a copy of her advance directives and DNR for our records. Exam unremarkable. Labs pending.  Next appointment: 1 year for repeat Medicare Wellness Visit.

## 2015-07-07 NOTE — Assessment & Plan Note (Signed)
All vaccinations are up to date. Colonoscopy, pap, mammogram up to date. Discussed the importance of a healthy diet and regular exercise in order for weight loss and to reduce risk of other medical diseases. She is to bring in a copy of her advance directives and DNR for our records. Exam unremarkable. Labs pending.  I have personally reviewed and have noted: 1. The patient's medical and social history 2. Their use of alcohol, tobacco or illicit drugs 3. Their current medications and supplements 4. The patient's functional ability including ADL's, fall risks, home safety risks and  hearing or visual impairment. 5. Diet and physical activities 6. Evidence for depression or mood disorder  Follow up in 1 year for repeat wellness exam.

## 2015-07-07 NOTE — Progress Notes (Signed)
Pre visit review using our clinic review tool, if applicable. No additional management support is needed unless otherwise documented below in the visit note. 

## 2015-07-07 NOTE — Assessment & Plan Note (Signed)
No bleeding since April 2017. Currently following with GYN and will visit them next in June to discuss D&C vs hysterectomy.

## 2015-07-07 NOTE — Patient Instructions (Addendum)
Complete lab work prior to leaving today. I will notify you of your results once received.   Work to increase consumption of vegetables, fruit, lean protein, whole grains, water.  You should be getting 1 hour of physical activity every day. This will help to protect your bones, heart, and immune system.  Ensure you are taking Vitamin D and calcium as you have osteoporosis.  Follow up in 1 year for repeat physical or sooner if needed.  It was a pleasure to see you today!

## 2015-07-07 NOTE — Assessment & Plan Note (Signed)
Stable today. No recent follow up with GI as she's not required.

## 2015-07-07 NOTE — Assessment & Plan Note (Signed)
Stable. Follows with ortho PRN.

## 2015-07-07 NOTE — Assessment & Plan Note (Signed)
Above goal last visit, lipids pending today. Not currently taking aspirin or statin. Diet is fair, discussed to increase vegetables, lean protein. Discussed to start physical activity.

## 2015-07-07 NOTE — Assessment & Plan Note (Signed)
Stable on omeprazole. 

## 2015-07-07 NOTE — Assessment & Plan Note (Signed)
Currently taking calcium and vitamin D. Vitamin D levels pending today. Discussed risks of decrease in bone density with long term PPI use. Discussed to increase activity in order to help strengthen bones.

## 2015-07-08 LAB — HEMOGLOBIN A1C: Hgb A1c MFr Bld: 6.1 % (ref 4.6–6.5)

## 2015-07-08 LAB — COMPREHENSIVE METABOLIC PANEL
ALT: 17 U/L (ref 0–35)
AST: 20 U/L (ref 0–37)
Albumin: 4.3 g/dL (ref 3.5–5.2)
Alkaline Phosphatase: 59 U/L (ref 39–117)
BUN: 16 mg/dL (ref 6–23)
CO2: 27 mEq/L (ref 19–32)
Calcium: 9.5 mg/dL (ref 8.4–10.5)
Chloride: 104 mEq/L (ref 96–112)
Creatinine, Ser: 0.93 mg/dL (ref 0.40–1.20)
GFR: 61.31 mL/min (ref >60.00)
Glucose, Bld: 80 mg/dL (ref 70–99)
Potassium: 4.3 mEq/L (ref 3.5–5.1)
Sodium: 138 mEq/L (ref 135–145)
Total Bilirubin: 0.3 mg/dL (ref 0.2–1.2)
Total Protein: 7.2 g/dL (ref 6.0–8.3)

## 2015-07-08 LAB — LDL CHOLESTEROL, DIRECT: Direct LDL: 59 mg/dL

## 2015-07-08 LAB — LIPID PANEL
Cholesterol: 225 mg/dL — ABNORMAL HIGH (ref 0–200)
HDL: 37.3 mg/dL — ABNORMAL LOW (ref >39.00)
NonHDL: 187.67
Total CHOL/HDL Ratio: 6
Triglycerides: 283 mg/dL — ABNORMAL HIGH (ref 0.0–149.0)
VLDL: 56.6 mg/dL — ABNORMAL HIGH (ref 0.0–40.0)

## 2015-07-08 LAB — VITAMIN D 25 HYDROXY (VIT D DEFICIENCY, FRACTURES): VITD: 23.38 ng/mL — ABNORMAL LOW (ref 30.00–100.00)

## 2015-08-06 ENCOUNTER — Ambulatory Visit (INDEPENDENT_AMBULATORY_CARE_PROVIDER_SITE_OTHER): Payer: Medicare Other | Admitting: Family Medicine

## 2015-08-06 ENCOUNTER — Encounter: Payer: Self-pay | Admitting: Family Medicine

## 2015-08-06 VITALS — BP 125/61 | HR 69 | Resp 18 | Ht 60.0 in | Wt 163.0 lb

## 2015-08-06 DIAGNOSIS — N950 Postmenopausal bleeding: Secondary | ICD-10-CM

## 2015-08-06 NOTE — Assessment & Plan Note (Signed)
After careful consideration of all options and discussion of risks involved in all options, her age, she is agreeable to D and C with hysteroscopy. Risks include but are not limited to bleeding, infection, injury to surrounding structures, including bowel, bladder and ureters, blood clots, and death.  Likelihood of success is moderate. Failure to obtain diagnosis again and need for further w/u and procedure discussed as well as risks with bigger surgeries.

## 2015-08-06 NOTE — Patient Instructions (Signed)
Hysteroscopy °Hysteroscopy is a procedure used for looking inside the womb (uterus). It may be done for various reasons, including: °· To evaluate abnormal bleeding, fibroid (benign, noncancerous) tumors, polyps, scar tissue (adhesions), and possibly cancer of the uterus. °· To look for lumps (tumors) and other uterine growths. °· To look for causes of why a woman cannot get pregnant (infertility), causes of recurrent loss of pregnancy (miscarriages), or a lost intrauterine device (IUD). °· To perform a sterilization by blocking the fallopian tubes from inside the uterus. °In this procedure, a thin, flexible tube with a tiny light and camera on the end of it (hysteroscope) is used to look inside the uterus. A hysteroscopy should be done right after a menstrual period to be sure you are not pregnant. °LET YOUR HEALTH CARE PROVIDER KNOW ABOUT:  °· Any allergies you have. °· All medicines you are taking, including vitamins, herbs, eye drops, creams, and over-the-counter medicines. °· Previous problems you or members of your family have had with the use of anesthetics. °· Any blood disorders you have. °· Previous surgeries you have had. °· Medical conditions you have. °RISKS AND COMPLICATIONS  °Generally, this is a safe procedure. However, as with any procedure, complications can occur. Possible complications include: °· Putting a hole in the uterus. °· Excessive bleeding. °· Infection. °· Damage to the cervix. °· Injury to other organs. °· Allergic reaction to medicines. °· Too much fluid used in the uterus for the procedure. °BEFORE THE PROCEDURE  °· Ask your health care provider about changing or stopping any regular medicines. °· Do not take aspirin or blood thinners for 1 week before the procedure, or as directed by your health care provider. These can cause bleeding. °· If you smoke, do not smoke for 2 weeks before the procedure. °· In some cases, a medicine is placed in the cervix the day before the procedure.  This medicine makes the cervix have a larger opening (dilate). This makes it easier for the instrument to be inserted into the uterus during the procedure. °· Do not eat or drink anything for at least 8 hours before the surgery. °· Arrange for someone to take you home after the procedure. °PROCEDURE  °· You may be given a medicine to relax you (sedative). You may also be given one of the following: °¨ A medicine that numbs the area around the cervix (local anesthetic). °¨ A medicine that makes you sleep through the procedure (general anesthetic). °· The hysteroscope is inserted through the vagina into the uterus. The camera on the hysteroscope sends a picture to a TV screen. This gives the surgeon a good view inside the uterus. °· During the procedure, air or a liquid is put into the uterus, which allows the surgeon to see better. °· Sometimes, tissue is gently scraped from inside the uterus. These tissue samples are sent to a lab for testing. °AFTER THE PROCEDURE  °· If you had a general anesthetic, you may be groggy for a couple hours after the procedure. °· If you had a local anesthetic, you will be able to go home as soon as you are stable and feel ready. °· You may have some cramping. This normally lasts for a couple days. °· You may have bleeding, which varies from light spotting for a few days to menstrual-like bleeding for 3-7 days. This is normal. °· If your test results are not back during the visit, make an appointment with your health care provider to find out the   results.   This information is not intended to replace advice given to you by your health care provider. Make sure you discuss any questions you have with your health care provider.   Document Released: 05/15/2000 Document Revised: 11/27/2012 Document Reviewed: 09/05/2012 Elsevier Interactive Patient Education [employer].

## 2015-08-06 NOTE — Progress Notes (Signed)
    Subjective:    Patient ID: Shari Vincent is a 82 y.o. female presenting with Follow-up  on 08/06/2015  HPI: Patient returns following up on PMB. She has previous w/u that includes EMB x 1 and D and C Hysteroscopy. Both with negative pathology. She then had another episode of vaginal bleeding in march. No bleeding since then. Offered another office sampling, D and C with hysteroscopy again or hysterectomy.  Patient also has a solid appearing tumor on the right ovary, approximately 4 x 3 x 3 cm, consistent with ovarian fibroma and unchanged.  Review of Systems  Constitutional: Negative for fever and chills.  Respiratory: Negative for shortness of breath.   Cardiovascular: Negative for chest pain.  Gastrointestinal: Negative for nausea, vomiting and abdominal pain.  Genitourinary: Negative for dysuria.  Skin: Negative for rash.      Objective:    BP 125/61 mmHg  Pulse 69  Resp 18  Ht 5' (1.524 m)  Wt 163 lb (73.936 kg)  BMI 31.83 kg/m2 Physical Exam  Constitutional: She is oriented to person, place, and time. She appears well-developed and well-nourished. No distress.  HENT:  Head: Normocephalic and atraumatic.  Eyes: No scleral icterus.  Neck: Neck supple.  Cardiovascular: Normal rate.   Pulmonary/Chest: Effort normal.  Abdominal: Soft.  Neurological: She is alert and oriented to person, place, and time.  Skin: Skin is warm and dry.  Psychiatric: She has a normal mood and affect.        Assessment & Plan:   Problem List Items Addressed This Visit      Unprioritized   PMB (postmenopausal bleeding) - Primary    After careful consideration of all options and discussion of risks involved in all options, her age, she is agreeable to D and C with hysteroscopy. Risks include but are not limited to bleeding, infection, injury to surrounding structures, including bowel, bladder and ureters, blood clots, and death.  Likelihood of success is moderate. Failure to obtain  diagnosis again and need for further w/u and procedure discussed as well as risks with bigger surgeries.           Total face-to-face time with patient: 25 minutes. Over 50% of encounter was spent on counseling and coordination of care. Return in about 3 months (around 11/06/2015) for postop check.  Donyel Castagnola S 08/06/2015 9:54 AM

## 2015-08-09 ENCOUNTER — Encounter (HOSPITAL_COMMUNITY): Payer: Self-pay | Admitting: *Deleted

## 2015-08-10 NOTE — H&P (Signed)
Shari Vincent is an 82 y.o.  female.    Chief Complaint: Postmenopausal bleeding  HPI: Patient has PMB. She has previous w/u that includes EMB x 1 and D and C Hysteroscopy. Both with negative pathology. She then had another episode of vaginal bleeding in march. No bleeding since then. Offered another office sampling, D and C with hysteroscopy again or hysterectomy.  Patient also has a solid appearing tumor on the right ovary, approximately 4 x 3 x 3 cm, consistent with ovarian fibroma and unchanged.   Past Medical History  Diagnosis Date  . High cholesterol   . Acid reflux   . Cancer ([place])     Basal Cell on right shoulder  . Kidney stone   . Vertigo   . Diverticulitis   . Colon polyps   . Trigeminal neuralgia   . Trigeminal neuralgia of right side of face   . Arthritis     right knee  . Bulging lumbar disc   . Irritable bowel syndrome (IBS)     Past Surgical History  Procedure Laterality Date  . Foot surgery      Left foot, metal plate  . Other surgical history      Uterine Prolapse/Tacking  . Cataract extraction    . Colonoscopy  05/27/2008    [id number] rectal polyp,s/p bx/sigmoid diverticula. hyperplastic polyps. benign random bx  . Esophagogastroduodenoscopy  05/27/2008    [id number] HH  . Prolapsed uterine fibroid ligation      in 1960  . Breast surgery      biopsy in 1990   . Eye surgery    . Hysteroscopy w/d&c N/A 09/03/2014    Procedure: DILATATION AND CURETTAGE /HYSTEROSCOPY;  Surgeon: Donnamae Jude, MD;  Location: [facility];  Service: Gynecology;  Laterality: N/A;  Requesting 09/03/14 @ 2:00p    Family History  Problem Relation Age of Onset  . Diabetes Mother   . Stroke Father   . Heart disease Mother     MI  . Uterine cancer Mother   . Cancer Paternal Grandmother     intestinal  . Stomach cancer Maternal Grandmother   . Colon cancer Neg Hx    Social History:  reports that she has quit smoking. Her smoking use included Cigarettes. She has  never used smokeless tobacco. She reports that she does not drink alcohol or use illicit drugs.  Allergies:  Allergies  Allergen Reactions  . Albuterol Rash  . Cefdinir Diarrhea  . Levofloxacin Nausea Only  . Latex Rash    No current facility-administered medications on file prior to encounter.   Current Outpatient Prescriptions on File Prior to Encounter  Medication Sig Dispense Refill  . acetaminophen (TYLENOL) 325 MG tablet Take 650 mg by mouth every 4 (four) hours as needed. Reported on 05/13/2015    . calcium citrate-vitamin D (CITRACAL+D) 315-200 MG-UNIT tablet Take 1 tablet by mouth daily. Reported on 08/09/2015    . gabapentin (NEURONTIN) 100 MG capsule Take 1 capsule (100 mg total) by mouth at bedtime. 90 capsule 1  . meclizine (ANTIVERT) 25 MG tablet Take 25 mg by mouth 3 (three) times daily as needed for dizziness. Reported on 05/13/2015    . omeprazole (PRILOSEC) 20 MG capsule Take 20 mg by mouth daily.      . OXcarbazepine (TRILEPTAL) 150 MG tablet Take 75-150 mg by mouth 2 (two) times daily. Patient takes 75 mg in the morning and 150 mg at bedtime.    . Probiotic Product (PROBIOTIC DAILY  PO) Take 1 tablet by mouth daily.       A comprehensive review of systems was negative.  There were no vitals taken for this visit. General appearance: alert, cooperative and appears stated age Neck: supple, symmetrical, trachea midline Lungs: normal effort Heart: regular rate and rhythm Abdomen: soft, non-tender; bowel sounds normal; no masses,  no organomegaly Extremities: extremities normal, atraumatic, no cyanosis or edema Skin: Skin color, texture, turgor normal. No rashes or lesions Neurologic: Grossly normal   Lab Results  Component Value Date   WBC 7.9 08/27/2014   HGB 13.5 08/27/2014   HCT 40.0 08/27/2014   MCV 90.9 08/27/2014   PLT 186 08/27/2014   No results found for: PREGTESTUR, PREGSERUM, HCG, HCGQUANT   Assessment/Plan Principal Problem:   PMB  (postmenopausal bleeding)  For Dilation and Curettage with Hysteroscopy Risks include but are not limited to bleeding, infection, injury to surrounding structures, including bowel, bladder and ureters, blood clots, and death.  Likelihood of success is moderate.    Da Michelle S 08/10/2015, 9:42 AM

## 2015-08-11 NOTE — Patient Instructions (Signed)
Your procedure is scheduled on:  Thursday, August 19, 2015  Enter through the [employer] [facility] at:  [place] up the phone at the desk and dial [phone].  Call this number if you have problems the morning of surgery: [phone].  Remember: Do NOT eat food:  After midnight Wednesday  Do NOT drink clear liquids after:  9:30 AM day of surgery  Take these medicines the morning of surgery with a SIP OF WATER:  Omeprazole, Oxcarbazepine  Do NOT wear jewelry (body piercing), metal hair clips/bobby pins, make-up, or nail polish. Do NOT wear lotions, powders, or perfumes.  You may wear deodorant. Do NOT shave for 48 hours prior to surgery. Do NOT bring valuables to the hospital. Contacts, dentures, or bridgework may not be worn into surgery.  Have a responsible adult drive you home and stay with you for 24 hours after your procedure

## 2015-08-12 ENCOUNTER — Encounter (HOSPITAL_COMMUNITY): Payer: Self-pay

## 2015-08-12 ENCOUNTER — Encounter (HOSPITAL_COMMUNITY)
Admission: RE | Admit: 2015-08-12 | Discharge: 2015-08-12 | Disposition: A | Discharge status: Home / Self Care | Payer: Medicare Other | Source: Ambulatory Visit | Attending: Family Medicine | Admitting: Family Medicine

## 2015-08-12 ENCOUNTER — Other Ambulatory Visit: Payer: Self-pay

## 2015-08-12 DIAGNOSIS — Z01812 Encounter for preprocedural laboratory examination: Secondary | ICD-10-CM | POA: Diagnosis present

## 2015-08-12 DIAGNOSIS — Z01810 Encounter for preprocedural cardiovascular examination: Secondary | ICD-10-CM | POA: Insufficient documentation

## 2015-08-12 HISTORY — DX: Sciatica, unspecified side: M54.30

## 2015-08-12 HISTORY — DX: Trigeminal neuralgia: G50.0

## 2015-08-12 HISTORY — DX: Pain in unspecified knee: M25.569

## 2015-08-12 LAB — BASIC METABOLIC PANEL
Anion gap: 6 (ref 5–15)
BUN: 12 mg/dL (ref 6–20)
CO2: 28 mmol/L (ref 22–32)
Calcium: 9.4 mg/dL (ref 8.9–10.3)
Chloride: 105 mmol/L (ref 101–111)
Creatinine, Ser: 0.73 mg/dL (ref 0.44–1.00)
GFR calc Af Amer: >60 mL/min (ref >60)
GFR calc non Af Amer: >60 mL/min (ref >60)
Glucose, Bld: 91 mg/dL (ref 65–99)
Potassium: 3.8 mmol/L (ref 3.5–5.1)
Sodium: 139 mmol/L (ref 135–145)

## 2015-08-12 LAB — CBC
HCT: 39.2 % (ref 36.0–46.0)
Hemoglobin: 13.1 g/dL (ref 12.0–15.0)
MCH: 29.9 pg (ref 26.0–34.0)
MCHC: 33.4 g/dL (ref 30.0–36.0)
MCV: 89.5 fL (ref 78.0–100.0)
Platelets: 185 10*3/uL (ref 150–400)
RBC: 4.38 MIL/uL (ref 3.87–5.11)
RDW: 13.4 % (ref 11.5–15.5)
WBC: 6.9 10*3/uL (ref 4.0–10.5)

## 2015-08-19 ENCOUNTER — Ambulatory Visit (HOSPITAL_COMMUNITY)
Admission: RE | Admit: 2015-08-19 | Discharge: 2015-08-19 | Disposition: A | Discharge status: Home / Self Care | Payer: Medicare Other | Source: Ambulatory Visit | Attending: Family Medicine | Admitting: Family Medicine

## 2015-08-19 ENCOUNTER — Ambulatory Visit (HOSPITAL_COMMUNITY): Payer: Medicare Other | Admitting: Anesthesiology

## 2015-08-19 ENCOUNTER — Encounter (HOSPITAL_COMMUNITY): Payer: Self-pay | Admitting: *Deleted

## 2015-08-19 ENCOUNTER — Encounter (HOSPITAL_COMMUNITY)
Admission: RE | Disposition: A | Discharge status: Home / Self Care | Payer: Self-pay | Source: Ambulatory Visit | Attending: Family Medicine

## 2015-08-19 DIAGNOSIS — E7800 Pure hypercholesterolemia, unspecified: Secondary | ICD-10-CM | POA: Diagnosis not present

## 2015-08-19 DIAGNOSIS — N950 Postmenopausal bleeding: Secondary | ICD-10-CM | POA: Diagnosis not present

## 2015-08-19 DIAGNOSIS — K589 Irritable bowel syndrome without diarrhea: Secondary | ICD-10-CM | POA: Diagnosis not present

## 2015-08-19 DIAGNOSIS — G500 Trigeminal neuralgia: Secondary | ICD-10-CM | POA: Insufficient documentation

## 2015-08-19 DIAGNOSIS — Z87891 Personal history of nicotine dependence: Secondary | ICD-10-CM | POA: Insufficient documentation

## 2015-08-19 DIAGNOSIS — K219 Gastro-esophageal reflux disease without esophagitis: Secondary | ICD-10-CM | POA: Insufficient documentation

## 2015-08-19 HISTORY — PX: HYSTEROSCOPY WITH D & C: SHX1775

## 2015-08-19 SURGERY — DILATATION AND CURETTAGE /HYSTEROSCOPY
Anesthesia: General | Site: Vagina

## 2015-08-19 MED ORDER — PROPOFOL 500 MG/50ML IV EMUL
INTRAVENOUS | Status: DC | PRN
  Administered 2015-08-19: 30 ug/kg/min via INTRAVENOUS

## 2015-08-19 MED ORDER — PROPOFOL 10 MG/ML IV BOLUS
INTRAVENOUS | Status: DC | PRN
  Administered 2015-08-19: 25 mg via INTRAVENOUS
  Administered 2015-08-19: 100 mg via INTRAVENOUS
  Administered 2015-08-19: 25 mg via INTRAVENOUS
  Administered 2015-08-19: 50 mg via INTRAVENOUS

## 2015-08-19 MED ORDER — ONDANSETRON HCL 4 MG/2ML IJ SOLN: 4.0000 mg | Freq: Once | INTRAMUSCULAR | Status: DC | PRN

## 2015-08-19 MED ORDER — IBUPROFEN 100 MG/5ML PO SUSP
200.0000 mg | Freq: Four times a day (QID) | ORAL | Status: DC | PRN
  Filled 2015-08-19: qty 20

## 2015-08-19 MED ORDER — PROPOFOL 10 MG/ML IV BOLUS
INTRAVENOUS | Status: AC
  Filled 2015-08-19: qty 20

## 2015-08-19 MED ORDER — LIDOCAINE HCL (CARDIAC) 20 MG/ML IV SOLN
INTRAVENOUS | Status: AC
  Filled 2015-08-19: qty 5

## 2015-08-19 MED ORDER — LIDOCAINE HCL (CARDIAC) 20 MG/ML IV SOLN
INTRAVENOUS | Status: DC | PRN
  Administered 2015-08-19: 50 mg via INTRAVENOUS

## 2015-08-19 MED ORDER — BUPIVACAINE HCL (PF) 0.25 % IJ SOLN
INTRAMUSCULAR | Status: AC
  Filled 2015-08-19: qty 30

## 2015-08-19 MED ORDER — IBUPROFEN 200 MG PO TABS
200.0000 mg | ORAL_TABLET | Freq: Four times a day (QID) | ORAL | Status: DC | PRN
  Filled 2015-08-19: qty 2

## 2015-08-19 MED ORDER — BUPIVACAINE HCL (PF) 0.25 % IJ SOLN
INTRAMUSCULAR | Status: DC | PRN
  Administered 2015-08-19: 20 mL

## 2015-08-19 MED ORDER — ONDANSETRON HCL 4 MG/2ML IJ SOLN
INTRAMUSCULAR | Status: DC | PRN
  Administered 2015-08-19: 4 mg via INTRAVENOUS

## 2015-08-19 MED ORDER — BUPIVACAINE-EPINEPHRINE (PF) 0.25% -1:200000 IJ SOLN
INTRAMUSCULAR | Status: AC
  Filled 2015-08-19: qty 30

## 2015-08-19 MED ORDER — FENTANYL CITRATE (PF) 100 MCG/2ML IJ SOLN
INTRAMUSCULAR | Status: AC
  Filled 2015-08-19: qty 2

## 2015-08-19 MED ORDER — SODIUM CHLORIDE 0.9 % IR SOLN
Status: DC | PRN
  Administered 2015-08-19: 3000 mL

## 2015-08-19 MED ORDER — LACTATED RINGERS IV SOLN
INTRAVENOUS | Status: DC
  Administered 2015-08-19: 13:00:00 via INTRAVENOUS

## 2015-08-19 MED ORDER — ONDANSETRON HCL 4 MG/2ML IJ SOLN
INTRAMUSCULAR | Status: AC
  Filled 2015-08-19: qty 2

## 2015-08-19 SURGICAL SUPPLY — 18 items
BIPOLAR CUTTING LOOP 21FR (ELECTRODE)
CANISTER SUCT 3000ML (MISCELLANEOUS) ×3 IMPLANT
CATH ROBINSON RED A/P 16FR (CATHETERS) ×3 IMPLANT
CLOTH BEACON ORANGE TIMEOUT ST (SAFETY) ×3 IMPLANT
CONTAINER PREFILL 10% NBF 60ML (FORM) ×6 IMPLANT
ELECT REM PT RETURN 9FT ADLT (ELECTROSURGICAL)
ELECTRODE REM PT RTRN 9FT ADLT (ELECTROSURGICAL) IMPLANT
GLOVE BIOGEL PI IND STRL 7.0 (GLOVE) ×2 IMPLANT
GLOVE BIOGEL PI INDICATOR 7.0 (GLOVE) ×4
GLOVE ECLIPSE 7.0 STRL STRAW (GLOVE) ×6 IMPLANT
GOWN STRL REUS W/TWL LRG LVL3 (GOWN DISPOSABLE) ×9 IMPLANT
LOOP CUTTING BIPOLAR 21FR (ELECTRODE) IMPLANT
PACK VAGINAL MINOR WOMEN LF (CUSTOM PROCEDURE TRAY) ×3 IMPLANT
PAD OB MATERNITY 4.3X12.25 (PERSONAL CARE ITEMS) ×3 IMPLANT
TOWEL OR 17X24 6PK STRL BLUE (TOWEL DISPOSABLE) ×6 IMPLANT
TUBING AQUILEX INFLOW (TUBING) ×3 IMPLANT
TUBING AQUILEX OUTFLOW (TUBING) ×3 IMPLANT
WATER STERILE IRR 1000ML POUR (IV SOLUTION) ×3 IMPLANT

## 2015-08-19 NOTE — Anesthesia Procedure Notes (Signed)
Procedure Name: LMA Insertion Date/Time: 08/19/2015 1:05 PM Performed by: Casimer Lanius A Pre-anesthesia Checklist: Patient being monitored, Patient identified, Emergency Drugs available and Suction available Patient Re-evaluated:Patient Re-evaluated prior to inductionOxygen Delivery Method: Circle system utilized Preoxygenation: Pre-oxygenation with 100% oxygen Intubation Type: IV induction and Inhalational induction Ventilation: Mask ventilation without difficulty LMA: LMA inserted LMA Size: 4.0 Number of attempts: 1 Dental Injury: Teeth and Oropharynx as per pre-operative assessment

## 2015-08-19 NOTE — Anesthesia Preprocedure Evaluation (Signed)
Anesthesia Evaluation  Patient identified by MRN, date of birth, ID band Patient awake    Reviewed: Allergy & Precautions, H&P , NPO status , Patient's Chart, lab work & pertinent test results  Airway Mallampati: I  TM Distance: >3 FB Neck ROM: full    Dental no notable dental hx. (+) Teeth Intact   Pulmonary former smoker,    Pulmonary exam normal        Cardiovascular negative cardio ROS Normal cardiovascular exam     Neuro/Psych negative psych ROS   GI/Hepatic Neg liver ROS, GERD  Medicated and Controlled,  Endo/Other  negative endocrine ROS  Renal/GU stones     Musculoskeletal   Abdominal Normal abdominal exam  (+)  Abdomen: soft.    Peds  Hematology negative hematology ROS (+)   Anesthesia Other Findings Partials out  Reproductive/Obstetrics negative OB ROS                             Anesthesia Physical  Anesthesia Plan  ASA: II  Anesthesia Plan: General   Post-op Pain Management:    Induction: Intravenous  Airway Management Planned: LMA  Additional Equipment:   Intra-op Plan:   Post-operative Plan:   Informed Consent: I have reviewed the patients History and Physical, chart, labs and discussed the procedure including the risks, benefits and alternatives for the proposed anesthesia with the patient or authorized representative who has indicated his/her understanding and acceptance.     Plan Discussed with: CRNA and Surgeon  Anesthesia Plan Comments:         Anesthesia Quick Evaluation

## 2015-08-19 NOTE — Interval H&P Note (Signed)
History and Physical Interval Note:  08/19/2015 12:53 PM  Shari Vincent  has presented today for surgery, with the diagnosis of post menopausal bleeding  The various methods of treatment have been discussed with the patient and family. After consideration of risks, benefits and other options for treatment, the patient has consented to  Procedure(s): DILATATION AND CURETTAGE /HYSTEROSCOPY (N/A) as a surgical intervention .  The patient's history has been reviewed, patient examined, no change in status, stable for surgery.  I have reviewed the patient's chart and labs.  Questions were answered to the patient's satisfaction.     [place]

## 2015-08-19 NOTE — Transfer of Care (Signed)
Immediate Anesthesia Transfer of Care Note  Patient: Shari Vincent  Procedure(s) Performed: Procedure(s): DILATATION AND CURETTAGE /HYSTEROSCOPY (N/A)  Patient Location: PACU  Anesthesia Type:General  Level of Consciousness: awake  Airway & Oxygen Therapy: Patient Spontanous Breathing  Post-op Assessment: Report given to PACU RN  Post vital signs: stable  Filed Vitals:   08/19/15 1237  BP: 154/78  Pulse: 64  Temp: 36.7 C  Resp: 16    Complications: No apparent anesthesia complications

## 2015-08-19 NOTE — Anesthesia Postprocedure Evaluation (Signed)
Anesthesia Post Note  Patient: Shari Vincent  Procedure(s) Performed: Procedure(s) (LRB): DILATATION AND CURETTAGE /HYSTEROSCOPY (N/A)  Patient location during evaluation: PACU Anesthesia Type: General Level of consciousness: awake Pain management: pain level controlled Vital Signs Assessment: post-procedure vital signs reviewed and stable Respiratory status: spontaneous breathing Cardiovascular status: stable Postop Assessment: no signs of nausea or vomiting Anesthetic complications: no     Last Vitals:  Filed Vitals:   08/19/15 1440 08/19/15 1445  BP:    Pulse: 69 72  Temp: 36.8 C   Resp: 15 22    Last Pain: There were no vitals filed for this visit. Pain Goal: Patients Stated Pain Goal: 3 (08/19/15 1237)               [place]

## 2015-08-19 NOTE — Op Note (Signed)
Preoperative diagnoses:    Postoperative diagnosis: Same  Procedure: D & C, hysteroscopy  Surgeon: Standley Dakins. Kennon Rounds, MD  Anesthesia: MAC Lyn Hollingshead, MD  Findings: normal atrophic uterine cavity   Fluid deficit: 10 cc  Estimated blood loss: Minimum  Pathology: Endometrial curettings  Indications for procedure: 82 y.o. No obstetric history on file. with history of postmenopausal bleeding who presents for diagnostic work-up. No bleeding since February. Initially wanted a hysterectomy but decided to just have a diagnostic procedure. Declined office EMB.  Procedure: The patient was taken to the operating room where spinal analgesia was inserted. SCDs were in place.  Time out was performed. Patient was placed in dorsolithotomy in [place]. She was prepped and draped in the usual sterile fashion. A Latex free catheter was used to drain her bladder. A speculum was placeed in the vagina. The cervix was visualized anteriorly and grasped with a single-tooth tenaculum. Paracervical block was performed with 1% lidocaine plain with 20 cc injected. The uterus sounded to 7 cm. Sequential dilation was performed with Kennon Rounds dilators. The hysteroscope was inserted and the endometrial cavity and inspected. There were no significant findings noted in the endometrial cavity with both ostia seen.  The hysteroscope was removed. Sharp curettage was performed in all 4 quadrants. All instruments were removed from the vagina. All instrument, needle and lap counts were correct x2. The patient was awakened and is recovering in stable condition.  Maleeah Crossman S MD 08/19/2015 1:31 PM

## 2015-08-19 NOTE — Discharge Instructions (Signed)
Hysteroscopy Hysteroscopy is a procedure used for looking inside the womb (uterus). It may be done for various reasons, including:  To evaluate abnormal bleeding, fibroid (benign, noncancerous) tumors, polyps, scar tissue (adhesions), and possibly cancer of the uterus.  To look for lumps (tumors) and other uterine growths.  To look for causes of why a woman cannot get pregnant (infertility), causes of recurrent loss of pregnancy (miscarriages), or a lost intrauterine device (IUD).  To perform a sterilization by blocking the fallopian tubes from inside the uterus. In this procedure, a thin, flexible tube with a tiny light and camera on the end of it (hysteroscope) is used to look inside the uterus. A hysteroscopy should be done right after a menstrual period to be sure you are not pregnant. LET [facility] CARE PROVIDER KNOW ABOUT:   Any allergies you have.  All medicines you are taking, including vitamins, herbs, eye drops, creams, and over-the-counter medicines.  Previous problems you or members of your family have had with the use of anesthetics.  Any blood disorders you have.  Previous surgeries you have had.  Medical conditions you have. RISKS AND COMPLICATIONS  Generally, this is a safe procedure. However, as with any procedure, complications can occur. Possible complications include:  Putting a hole in the uterus.  Excessive bleeding.  Infection.  Damage to the cervix.  Injury to other organs.  Allergic reaction to medicines.  Too much fluid used in the uterus for the procedure. BEFORE THE PROCEDURE   Ask your health care provider about changing or stopping any regular medicines.  Do not take aspirin or blood thinners for 1 week before the procedure, or as directed by your health care provider. These can cause bleeding.  If you smoke, do not smoke for 2 weeks before the procedure.  In some cases, a medicine is placed in the cervix the day before the procedure.  This medicine makes the cervix have a larger opening (dilate). This makes it easier for the instrument to be inserted into the uterus during the procedure.  Do not eat or drink anything for at least 8 hours before the surgery.  Arrange for someone to take you home after the procedure. PROCEDURE   You may be given a medicine to relax you (sedative). You may also be given one of the following:  A medicine that numbs the area around the cervix (local anesthetic).  A medicine that makes you sleep through the procedure (general anesthetic).  The hysteroscope is inserted through the vagina into the uterus. The camera on the hysteroscope sends a picture to a TV screen. This gives the surgeon a good view inside the uterus.  During the procedure, air or a liquid is put into the uterus, which allows the surgeon to see better.  Sometimes, tissue is gently scraped from inside the uterus. These tissue samples are sent to a lab for testing. AFTER THE PROCEDURE   If you had a general anesthetic, you may be groggy for a couple hours after the procedure.  If you had a local anesthetic, you will be able to go home as soon as you are stable and feel ready.  You may have some cramping. This normally lasts for a couple days.  You may have bleeding, which varies from light spotting for a few days to menstrual-like bleeding for 3-7 days. This is normal.  If your test results are not back during the visit, make an appointment with your health care provider to find out the  results.   This information is not intended to replace advice given to you by your health care provider. Make sure you discuss any questions you have with your health care provider.   Document Released: 05/15/2000 Document Revised: 11/27/2012 Document Reviewed: 09/05/2012 Elsevier Interactive Patient Education 2016 [place]: D&C / D&E The following instructions have been prepared to help you care for yourself  upon your return home.   Personal hygiene:  Use sanitary pads for vaginal drainage, not tampons.  Shower the day after your procedure.  NO tub baths, pools or Jacuzzis for 2-3 weeks.  Wipe front to back after using the bathroom.  Activity and limitations:  Do NOT drive or operate any equipment for 24 hours. The effects of anesthesia are still present and drowsiness may result.  Do NOT rest in bed all day.  Walking is encouraged.  Walk up and down stairs slowly.  You may resume your normal activity in one to two days or as indicated by your physician.  Sexual activity: NO intercourse for at least 2 weeks after the procedure, or as indicated by your physician.  Diet: Eat a light meal as desired this evening. You may resume your usual diet tomorrow.  Return to work: You may resume your work activities in one to two days or as indicated by your doctor.  What to expect after your surgery: Expect to have vaginal bleeding/discharge for 2-3 days and spotting for up to 10 days. It is not unusual to have soreness for up to 1-2 weeks. You may have a slight burning sensation when you urinate for the first day. Mild cramps may continue for a couple of days. You may have a regular period in 2-6 weeks.  Call your doctor for any of the following:  Excessive vaginal bleeding, saturating and changing one pad every hour.  Inability to urinate 6 hours after discharge from hospital.  Pain not relieved by pain medication.  Fever of 100.4 F or greater.  Unusual vaginal discharge or odor.   Call for an appointment:    Patients signature: ______________________  Nurses signature ________________________  Support person's signature_______________________

## 2015-08-20 ENCOUNTER — Encounter (HOSPITAL_COMMUNITY): Payer: Self-pay | Admitting: Family Medicine

## 2016-01-09 ENCOUNTER — Other Ambulatory Visit: Payer: Self-pay | Admitting: Primary Care

## 2016-01-09 DIAGNOSIS — E559 Vitamin D deficiency, unspecified: Secondary | ICD-10-CM

## 2016-01-09 DIAGNOSIS — E785 Hyperlipidemia, unspecified: Secondary | ICD-10-CM

## 2016-01-09 DIAGNOSIS — R7303 Prediabetes: Secondary | ICD-10-CM

## 2016-01-10 ENCOUNTER — Telehealth: Payer: Self-pay | Admitting: *Deleted

## 2016-01-10 ENCOUNTER — Other Ambulatory Visit (INDEPENDENT_AMBULATORY_CARE_PROVIDER_SITE_OTHER): Payer: Medicare Other

## 2016-01-10 DIAGNOSIS — M810 Age-related osteoporosis without current pathological fracture: Secondary | ICD-10-CM

## 2016-01-10 DIAGNOSIS — R7303 Prediabetes: Secondary | ICD-10-CM

## 2016-01-10 DIAGNOSIS — E785 Hyperlipidemia, unspecified: Secondary | ICD-10-CM

## 2016-01-10 DIAGNOSIS — E559 Vitamin D deficiency, unspecified: Secondary | ICD-10-CM | POA: Diagnosis not present

## 2016-01-10 NOTE — Telephone Encounter (Signed)
Placed in Kate's inbox. 

## 2016-01-10 NOTE — Telephone Encounter (Signed)
Ready for pick up. Placed in Chan's inbox. 

## 2016-01-10 NOTE — Telephone Encounter (Signed)
PT brought in handicap placard application to be filled out. Please fill it out and call the patient when it is ready. Form placed in prescription tower.

## 2016-01-10 NOTE — Telephone Encounter (Signed)
Yes, she may have this done after winter at her convenience. We will remind her in March 2018.

## 2016-01-10 NOTE — Telephone Encounter (Signed)
PT was told by a NP from her insurance company that she needed a dexa scan. Please advise. The pt was asking if she could wait until after the winter time to have it done. The patient can be reached by calling [phone].

## 2016-01-11 LAB — LIPID PANEL
Cholesterol: 216 mg/dL — ABNORMAL HIGH (ref 0–200)
HDL: 38.4 mg/dL — ABNORMAL LOW (ref >39.00)
NonHDL: 177.59
Total CHOL/HDL Ratio: 6
Triglycerides: 379 mg/dL — ABNORMAL HIGH (ref 0.0–149.0)
VLDL: 75.8 mg/dL — ABNORMAL HIGH (ref 0.0–40.0)

## 2016-01-11 LAB — VITAMIN D 25 HYDROXY (VIT D DEFICIENCY, FRACTURES): VITD: 43.59 ng/mL (ref 30.00–100.00)

## 2016-01-11 LAB — HEMOGLOBIN A1C: Hgb A1c MFr Bld: 6 % (ref 4.6–6.5)

## 2016-01-11 LAB — LDL CHOLESTEROL, DIRECT: Direct LDL: 60 mg/dL

## 2016-01-11 NOTE — Telephone Encounter (Signed)
Spoken and notified patient of Kate's comments. Patient verbalized understanding. 

## 2016-01-11 NOTE — Telephone Encounter (Signed)
Patient called back and was notified that she would like the form to be mailed.  Notified patient that it will be mailed out.

## 2016-01-11 NOTE — Telephone Encounter (Signed)
Message left for patient to return my call.  

## 2016-02-08 ENCOUNTER — Telehealth: Payer: Self-pay | Admitting: *Deleted

## 2016-02-08 NOTE — Telephone Encounter (Signed)
[place], pt daughter, no answer unable to leave message.

## 2016-02-08 NOTE — Telephone Encounter (Signed)
-----   Message from Francia Greaves sent at 02/07/2016  4:30 PM EST ----- Regarding: Advise Daughter called and wanted to talk to a nurse about her mother's PMB  [name] [phone]  Has an appt scheduled on 01/02

## 2016-02-10 ENCOUNTER — Telehealth: Payer: Self-pay | Admitting: *Deleted

## 2016-02-10 NOTE — Telephone Encounter (Signed)
-----   Message from Francia Greaves sent at 02/07/2016  4:30 PM EST ----- Regarding: Advise Daughter called and wanted to talk to a nurse about her mother's PMB  [name] [phone]  Has an appt scheduled on 01/02

## 2016-02-10 NOTE — Telephone Encounter (Signed)
Called pt/daughter, no answer, unable to leave message.

## 2016-02-22 ENCOUNTER — Encounter: Payer: Self-pay | Admitting: Family Medicine

## 2016-02-22 ENCOUNTER — Ambulatory Visit (INDEPENDENT_AMBULATORY_CARE_PROVIDER_SITE_OTHER): Payer: Medicare Other | Admitting: Family Medicine

## 2016-02-22 VITALS — BP 150/79 | HR 67 | Resp 18 | Ht 60.0 in | Wt 163.0 lb

## 2016-02-22 DIAGNOSIS — R1900 Intra-abdominal and pelvic swelling, mass and lump, unspecified site: Secondary | ICD-10-CM

## 2016-02-22 DIAGNOSIS — N950 Postmenopausal bleeding: Secondary | ICD-10-CM | POA: Diagnosis not present

## 2016-02-22 NOTE — Assessment & Plan Note (Signed)
Discussed options again. Again she would prefer to not proceed with further w/u as 2 D and C's have been benign and her photos are consistent with clean endometrium. ? Is her fibroma emitting some hormone that might influence this. Could opt for IUD placement if needed for symptom control.

## 2016-02-22 NOTE — Assessment & Plan Note (Signed)
Repeat u/s to look at size of this and ensure no change--if needs removal, could plan TVH at same time.

## 2016-02-22 NOTE — Progress Notes (Signed)
   Subjective:    Patient ID: Shari Vincent is a 83 y.o. female presenting with Follow-up  on 02/22/2016  HPI: Reports that she had a cycle from 12/13-12/19. Bleeding has now stopped. Previously has been through dilation and currettage x 2 with hysteroscopy with benign pathology.  Review of Systems  Constitutional: Negative for chills and fever.  Respiratory: Negative for shortness of breath.   Cardiovascular: Negative for chest pain.  Gastrointestinal: Negative for abdominal pain, nausea and vomiting.  Genitourinary: Negative for dysuria.  Skin: Negative for rash.      Objective:    BP (!) 150/79 (BP Location: Left Arm, Patient Position: Sitting, Cuff Size: Normal)   Pulse 67   Resp 18   Ht 5' (1.524 m)   Wt 163 lb (73.9 kg)   BMI 31.83 kg/m  Physical Exam  Constitutional: She is oriented to person, place, and time. She appears well-developed and well-nourished. No distress.  HENT:  Head: Normocephalic and atraumatic.  Eyes: No scleral icterus.  Neck: Neck supple.  Cardiovascular: Normal rate.   Pulmonary/Chest: Effort normal.  Abdominal: Soft.  Neurological: She is alert and oriented to person, place, and time.  Skin: Skin is warm and dry.  Psychiatric: She has a normal mood and affect.        Assessment & Plan:   Problem List Items Addressed This Visit      Unprioritized   PMB (postmenopausal bleeding) - Primary    Discussed options again. Again she would prefer to not proceed with further w/u as 2 D and C's have been benign and her photos are consistent with clean endometrium. ? Is her fibroma emitting some hormone that might influence this. Could opt for IUD placement if needed for symptom control.      Pelvic mass in female    Repeat u/s to look at size of this and ensure no change--if needs removal, could plan TVH at same time.      Relevant Orders   US Pelvis Complete   US Transvaginal Non-OB      Total face-to-face time with patient: 15 minutes.  Over 50% of encounter was spent on counseling and coordination of care. Return in about 3 months (around 05/22/2016).  Donnamae Jude 02/22/2016 2:53 PM

## 2016-02-22 NOTE — Patient Instructions (Signed)
Postmenopausal Bleeding Postmenopausal bleeding is any bleeding after menopause. Menopause is when a woman's period stops. Any type of bleeding after menopause is concerning. It should be checked by your doctor. Any treatment will depend on the cause. Follow these instructions at home: Watch your condition for any changes.  Avoid the use of tampons and douches as told by your doctor.  Change your pads often.  Get regular pelvic exams and Pap tests.  Keep all appointments for tests as told by your doctor.  Contact a doctor if:  Your bleeding lasts for more than 1 week.  You have belly (abdominal) pain.  You have bleeding after sex (intercourse). Get help right away if:  You have a fever, chills, a headache, dizziness, muscle aches, and bleeding.  You have strong pain with bleeding.  You have clumps of blood (blood clots) coming from your vagina.  You have bleeding and need more than 1 pad an hour.  You feel like you are going to pass out (faint). This information is not intended to replace advice given to you by your health care provider. Make sure you discuss any questions you have with your health care provider. Document Released: 11/16/2007 Document Revised: 07/15/2015 Document Reviewed: 09/05/2012 Elsevier Interactive Patient Education  2017 Elsevier Inc[employer]  

## 2016-02-29 ENCOUNTER — Ambulatory Visit (HOSPITAL_COMMUNITY)
Admission: RE | Admit: 2016-02-29 | Discharge: 2016-02-29 | Disposition: A | Discharge status: Home / Self Care | Payer: Medicare Other | Source: Ambulatory Visit | Attending: Family Medicine | Admitting: Family Medicine

## 2016-02-29 DIAGNOSIS — I10 Essential (primary) hypertension: Secondary | ICD-10-CM | POA: Insufficient documentation

## 2016-02-29 DIAGNOSIS — N950 Postmenopausal bleeding: Secondary | ICD-10-CM | POA: Insufficient documentation

## 2016-02-29 DIAGNOSIS — Z8049 Family history of malignant neoplasm of other genital organs: Secondary | ICD-10-CM | POA: Diagnosis not present

## 2016-02-29 DIAGNOSIS — R1900 Intra-abdominal and pelvic swelling, mass and lump, unspecified site: Secondary | ICD-10-CM | POA: Diagnosis present

## 2016-03-13 ENCOUNTER — Encounter: Payer: Self-pay | Admitting: Family Medicine

## 2016-04-24 ENCOUNTER — Telehealth: Payer: Self-pay | Admitting: Primary Care

## 2016-04-24 DIAGNOSIS — M810 Age-related osteoporosis without current pathological fracture: Secondary | ICD-10-CM

## 2016-04-24 NOTE — Telephone Encounter (Addendum)
-----   Message from Pleas Koch, NP sent at 01/10/2016  6:05 PM EST ----- Regarding: Bone Density Due for bone density testing. Would she like to get this done soon? I can go ahead and placed the order so she can schedule this at her convenience.

## 2016-04-24 NOTE — Telephone Encounter (Signed)
Message left for patient to return my call.  

## 2016-04-25 NOTE — Telephone Encounter (Signed)
Spoken to patient and she is agreeable to the bone density testing.

## 2016-04-25 NOTE — Telephone Encounter (Signed)
Noted, ordered

## 2016-04-26 ENCOUNTER — Telehealth: Payer: Self-pay | Admitting: *Deleted

## 2016-04-26 NOTE — Telephone Encounter (Signed)
Per pt chart, this pt has never received a prolia injection. The last request was 07/2014. Are you wanting pt to proceed with injection? If so, I'll be more than glad to submit the information to verify her benefits. pls advise

## 2016-04-27 NOTE — Telephone Encounter (Signed)
Yes, this would be beneficial, but not sure if the patient is agreeable. Please inquire and see if she's willing to complete, if not then she may continue with calcium and vitamin D.

## 2016-04-28 NOTE — Telephone Encounter (Signed)
Noted  

## 2016-04-28 NOTE — Telephone Encounter (Signed)
Spoke to pt who states she is not interested in prolia this time

## 2016-05-09 ENCOUNTER — Ambulatory Visit (INDEPENDENT_AMBULATORY_CARE_PROVIDER_SITE_OTHER): Payer: Medicare Other | Admitting: Gastroenterology

## 2016-05-09 ENCOUNTER — Encounter: Payer: Self-pay | Admitting: Gastroenterology

## 2016-05-09 VITALS — BP 126/72 | HR 68 | Temp 98.0°F | Ht 60.0 in | Wt 166.2 lb

## 2016-05-09 DIAGNOSIS — K219 Gastro-esophageal reflux disease without esophagitis: Secondary | ICD-10-CM

## 2016-05-09 DIAGNOSIS — K580 Irritable bowel syndrome with diarrhea: Secondary | ICD-10-CM | POA: Diagnosis not present

## 2016-05-09 MED ORDER — PANTOPRAZOLE SODIUM 20 MG PO TBEC: 20.0000 mg | DELAYED_RELEASE_TABLET | Freq: Every day | ORAL | 3 refills | Status: DC

## 2016-05-09 NOTE — Patient Instructions (Signed)
1. Stop omeprazole for now. Try pantoprazole 20mg  30 minutes before breakfast. If you continue to have acid reflux please call or send Mychart message and we can increase dose.  2. Return to the office in six weeks or sooner if needed.

## 2016-05-09 NOTE — Progress Notes (Signed)
Primary Care Physician: Sheral Flow, NP  Primary Gastroenterologist:  Garfield Cornea, MD   Chief Complaint  Patient presents with  . Abdominal Pain    HPI: Shari Vincent is a 83 y.o. female here for further evaluation of abdominal pain. Last seen in May 2016. EGD/TCS back in 2010 with benign random colon biopsies, hyperplastic rectal polyps, small hh. Has historically taken Levsin at onset of these symptoms with good relief of the abdominal pain.   Bowel attacks, last one 6 weeks ago, only one in the past three years. Didn't use the Levsin, couldn't get to it. Now she has it available in her bathroom. Other night, seemed to be starting and took Levsin with relief. Symtpoms last for 30-45 minutes. Diarrhea and feels like going to pass out. No vomiting. In between episodes she has regular BMs. No melena or brbpr. Heartburn is harder to control. Stopped orange juice, cranberry juice, started drinking chocolate milk.  Heartburn worse with any greens, salads with upper abd pain. Taking chewable digestive advantage, two daily. No NSAIDs, no ASA. Heartburn during the day, epig pain. Avoiding lots of foods to try and control symptoms. No dysphagia.   H/O endometrial thickening/postmenopausal bleeding, chronic right adnexal soft tissue mass. Followed by Dr. Kennon Rounds and has upcoming appt. Has had two D+C without cancer cells. Last pelvic u/s in 02/2016. Dr. Urbano Heir, [phone]. Steroid shots in back. Started having vaginal bleeding after that.   She has h/o osteoporosis. Previously not able to tolerate fosamax. Unable to come off PPI due to recurrent gerd and has concerns regarding bone health in setting of chronic PPI.     Current Outpatient Prescriptions  Medication Sig Dispense Refill  . calcium citrate-vitamin D (CITRACAL+D) 315-200 MG-UNIT tablet Take 1 tablet by mouth daily. Reported on 08/09/2015    . Cholecalciferol (D3 MAXIMUM STRENGTH) 5000 units capsule Take 5,000 Units  by mouth daily.    Marland Kitchen gabapentin (NEURONTIN) 100 MG capsule Take 1 capsule (100 mg total) by mouth at bedtime. 90 capsule 1  . hyoscyamine (LEVSIN, ANASPAZ) 0.125 MG tablet Take 0.125 mg by mouth every 4 (four) hours as needed.    . Liniments (BLUE-EMU SUPER STRENGTH) CREA Apply 1 application topically daily as needed (For pain.).    Marland Kitchen magnesium 30 MG tablet Take 30 mg by mouth 2 (two) times daily.    . meclizine (ANTIVERT) 25 MG tablet Take 25 mg by mouth 3 (three) times daily as needed for dizziness. Reported on 05/13/2015    . Multiple Vitamin (MULTIVITAMIN WITH MINERALS) TABS tablet Take 1 tablet by mouth daily.    Marland Kitchen omeprazole (PRILOSEC) 20 MG capsule Take 20 mg by mouth daily.      . OXcarbazepine (TRILEPTAL) 150 MG tablet Take 75-150 mg by mouth 2 (two) times daily. Patient takes 75 mg in the morning and 150 mg at bedtime.    . Probiotic Product (PROBIOTIC DAILY PO) Take 1 tablet by mouth daily.      No current facility-administered medications for this visit.     Allergies as of 05/09/2016 - Review Complete 05/09/2016  Allergen Reaction Noted  . Albuterol Rash 05/24/2014  . Cefdinir Diarrhea 07/23/2012  . Levofloxacin Nausea Only 10/22/2014  . Latex Rash 08/09/2015    ROS:  General: Negative for anorexia, weight loss, fever, chills, fatigue, weakness. ENT: Negative for hoarseness, difficulty swallowing , nasal congestion. CV: Negative for chest pain, angina, palpitations, dyspnea on exertion, peripheral edema.  Respiratory: Negative for dyspnea  at rest, dyspnea on exertion, cough, sputum, wheezing.  GI: See history of present illness. GU:  Negative for dysuria, hematuria, urinary incontinence, urinary frequency, nocturnal urination.  Endo: Negative for unusual weight change.    Physical Examination:   BP 126/72   Pulse 68   Temp 98 F (36.7 C) (Oral)   Ht 5' (1.524 m)   Wt 166 lb 3.2 oz (75.4 kg)   BMI 32.46 kg/m   General: Well-nourished, well-developed in no acute  distress.  Eyes: No icterus. Mouth: Oropharyngeal mucosa moist and pink , no lesions erythema or exudate. Lungs: Clear to auscultation bilaterally.  Heart: Regular rate and rhythm, no murmurs rubs or gallops.  Abdomen: Bowel sounds are normal, nontender, nondistended, no hepatosplenomegaly or masses, no abdominal bruits or hernia , no rebound or guarding.   Extremities: No lower extremity edema. No clubbing or deformities. Neuro: Alert and oriented x 4   Skin: Warm and dry, no jaundice.   Psych: Alert and cooperative, normal mood and affect.

## 2016-05-10 NOTE — Assessment & Plan Note (Signed)
Increased issues with breakthrough heartburn and epigastric pain more recently. Dietary changes without much improvement. Patient with h/o osteoporosis and concerns about PPI use. Bone density study scheduled in near future.   Instead of doubling her PPI, I would recommend switching to pantoprazole. Patient request trial of 20mg  (lowest dose) daily first. Reinforced antireflux measures. She will call in 2 weeks and let me know how she is doing. Would consider increase to 20mg  bid if still not controlled. Ov in six weeks with Dr. Gala Romney.

## 2016-05-10 NOTE — Assessment & Plan Note (Signed)
Possible IBS. Patient has infrequent episodes of abdominal cramping associated with diarrhea, occurring 1-2 times per year but had been 3 years inbetween last 2 episodes.   Symptoms respond to Levsin. Encouraged taking Levsin at onset of symptoms. If symptoms more frequent, could consider CT imaging. Last colonoscopy was in 2010. Return to office in six weeks to see Dr. Gala Romney.

## 2016-05-15 NOTE — Progress Notes (Signed)
cc'd to pcp 

## 2016-05-18 ENCOUNTER — Encounter: Payer: Self-pay | Admitting: Family Medicine

## 2016-05-18 ENCOUNTER — Ambulatory Visit (INDEPENDENT_AMBULATORY_CARE_PROVIDER_SITE_OTHER): Payer: Medicare Other | Admitting: Family Medicine

## 2016-05-18 DIAGNOSIS — N950 Postmenopausal bleeding: Secondary | ICD-10-CM | POA: Diagnosis not present

## 2016-05-18 DIAGNOSIS — R1900 Intra-abdominal and pelvic swelling, mass and lump, unspecified site: Secondary | ICD-10-CM

## 2016-05-18 NOTE — Progress Notes (Signed)
   Subjective:    Patient ID: ETHYL VILA is a 83 y.o. female presenting with Follow-up  on 05/18/2016  HPI: She is back for f/u. She has h/o postmenopausal bleeding and found to have ovarian mass. Has had EMB and D and C with hysteroscopy x 2  All of which were negative. 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 This could be hormonaly active, which might explain her bleeding. Last u/s 1/18 showed it had gotten bigger. I had advised surgery at that time with LAVH and BSO. She declined surgery.  She notes that she has not had any further bleeding. She is convinced that her bleeding likely resulted from steroid injections she was getting. She does have pain on the right side.   Review of Systems  Constitutional: Negative for chills and fever.  Respiratory: Negative for shortness of breath.   Cardiovascular: Negative for chest pain.  Gastrointestinal: Negative for abdominal pain, nausea and vomiting.  Genitourinary: Negative for dysuria.  Skin: Negative for rash.      Objective:    BP 122/78   Pulse 68   Ht 5' (1.524 m)   Wt 167 lb (75.8 kg)   BMI 32.61 kg/m  Physical Exam  Constitutional: She is oriented to person, place, and time. She appears well-developed and well-nourished. No distress.  HENT:  Head: Normocephalic and atraumatic.  Eyes: No scleral icterus.  Neck: Neck supple.  Cardiovascular: Normal rate.   Pulmonary/Chest: Effort normal.  Abdominal: Soft.  Neurological: She is alert and oriented to person, place, and time.  Skin: Skin is warm and dry.  Psychiatric: She has a normal mood and affect.        Assessment & Plan:   Problem List Items Addressed This Visit      Unprioritized   PMB (postmenopausal bleeding)    Patient has had no further bleeding         Total face-to-face time with patient: 25 minutes.  Over 50% of encounter was spent on counseling and coordination of care. Return in about 3 months (around 08/18/2016) for postop check.  Donnamae Jude 05/18/2016 10:54 AM

## 2016-05-18 NOTE — Assessment & Plan Note (Signed)
Patient has had no further bleeding

## 2016-05-18 NOTE — Patient Instructions (Signed)
Pelvic Mass A pelvic mass is an abnormal growth in the pelvis. The pelvis is the area between your hip bones. It includes the bladder and the rectum in males and females, and also the uterus and ovaries in females. What are the causes? Many things can cause a pelvic mass, including:  Cancer.  Fibroids of the uterus.  Ovarian cysts.  Infection.  Ectopic pregnancy. What are the signs or symptoms? Symptoms of a pelvic mass may include:  Cramping.  Nausea.  Diarrhea.  Fever.  Vomiting.  Weakness.  Pain in the pelvis, side, or back.  Weight loss.  Constipation.  Problems with vaginal bleeding, including:  Light or heavy bleeding with or without blood clots.  Irregular menstruation.  Pain with menstruation.  Problems with urination, including:  Frequent urination.  Inability to empty the bladder completely.  Urinating very small amounts.  Pain with urination.  Bloody urine. Some pelvic masses do not cause symptoms. How is this diagnosed? To make a diagnosis, your health care provider will need to learn more about the mass. You may have tests or procedures done, such as:  Blood tests.  X-rays.  Ultrasound.  CT scan.  MRI.  A surgery to look inside of your abdomen with cameras (laparoscopy).  A biopsy that is performed with a needle or during laparoscopy or surgery. In some cases, what seemed like a pelvic mass may actually be something else, such as a mass in one of the organs that are near the pelvis, an infection (abscess) or scar tissue (adhesions) that formed after a surgery. How is this treated? Treatment will depend on the cause of the mass. Follow these instructions at home: What you need to do at home will depend on the cause of the mass. Follow the instructions that your health care provider gives to you. In general:  Keep all follow-up visits as directed by your health care provider. This is important.  Take medicines only as directed  by your health care provider.  Follow any restrictions that are given to you by your health care provider. Contact a health care provider if:  You develop new symptoms. Get help right away if:  You vomit bright red blood or vomit material that looks like coffee grounds.  You have blood in your stools, or the stools turn black and tarry.  You have an abnormal or increased amount of vaginal bleeding.  You have a fever.  You develop easy bruising or bleeding.  You develop sudden or worsening pain that is not controlled by your medicine.  You feel worsening weakness, or you have a fainting episode.  You feel that the mass has suddenly gotten larger.  You develop severe bloating in your abdomen or your pelvis.  You cannot pass any urine.  You are unable to have a bowel movement. This information is not intended to replace advice given to you by your health care provider. Make sure you discuss any questions you have with your health care provider. Document Released: 05/16/2006 Document Revised: 07/15/2015 Document Reviewed: 09/22/2013 Elsevier Interactive Patient Education  2017 [employer]vier Inc.  

## 2016-05-18 NOTE — Assessment & Plan Note (Signed)
Given that this has gotten larger, we have discussed removal as the preferred option. The patient is not agreeable to surgery at this time. She did not consent to f/u u/s in 6 months. We have been watching this for 2 years and so far she is without symptoms. Risks of cancer discussed at length, as well as the risks of surgery. She and her Daughter-in-law had all scenarios laid out and discussed. She is unwilling to undergo surgery and the risks of that right now.

## 2016-05-22 ENCOUNTER — Encounter: Payer: Self-pay | Admitting: *Deleted

## 2016-06-06 ENCOUNTER — Telehealth: Payer: Self-pay | Admitting: Internal Medicine

## 2016-06-06 NOTE — Telephone Encounter (Signed)
Pt called asking for LSL. She has questions about her pantoprazole. Phone was real garbled and I could not understand everything she was saying. Please call [phone]

## 2016-06-08 ENCOUNTER — Telehealth: Payer: Self-pay | Admitting: Internal Medicine

## 2016-06-08 NOTE — Telephone Encounter (Signed)
Tried to call pt- NA-no voicemail.  

## 2016-06-08 NOTE — Telephone Encounter (Signed)
Pt had called on 4/17 and hasn't heard back from [place]. She had questions about her pantoprazole. Please call her at [phone]

## 2016-06-08 NOTE — Telephone Encounter (Signed)
Please call her at [phone]. She was returning a missed call on her cellphone

## 2016-06-08 NOTE — Telephone Encounter (Signed)
Tried to call pt- NA-No voicemail.  

## 2016-06-09 NOTE — Telephone Encounter (Signed)
Tried to call pt- NA-LM 

## 2016-06-09 NOTE — Telephone Encounter (Signed)
Spoke with the pt, she wants to know if she can take her pantoprazole twice a day instead of once a day.

## 2016-06-12 NOTE — Telephone Encounter (Signed)
Yes she can take one in the morning and one before evening meal. Keep upcoming ov with rmr.

## 2016-06-12 NOTE — Telephone Encounter (Signed)
Pt is aware. She said she would try it and will call if she needs a new rx sent in.

## 2016-06-22 ENCOUNTER — Encounter: Payer: Self-pay | Admitting: Primary Care

## 2016-06-23 ENCOUNTER — Ambulatory Visit (INDEPENDENT_AMBULATORY_CARE_PROVIDER_SITE_OTHER): Payer: Medicare Other | Admitting: Primary Care

## 2016-06-23 ENCOUNTER — Encounter: Payer: Self-pay | Admitting: Primary Care

## 2016-06-23 VITALS — BP 136/86 | HR 63 | Temp 97.5°F | Ht 60.0 in | Wt 165.8 lb

## 2016-06-23 DIAGNOSIS — R29898 Other symptoms and signs involving the musculoskeletal system: Secondary | ICD-10-CM | POA: Diagnosis not present

## 2016-06-23 DIAGNOSIS — E785 Hyperlipidemia, unspecified: Secondary | ICD-10-CM | POA: Diagnosis not present

## 2016-06-23 DIAGNOSIS — R7303 Prediabetes: Secondary | ICD-10-CM

## 2016-06-23 NOTE — Telephone Encounter (Signed)
Shari Vincent pts daughter (DPR signed) said that pt had new symptoms of tingling in lt fingers and hand and lt arm felt "floppy". Then pt had lt jaw tingling that last about 15'. Pt rested well last night and feels OK today. [name] request appt with [name]. Pt scheduled with [name] 06/23/16 at 2:45. Pt has appt in [place] at noon. If pt condition changes or worsens prior to appt pt will go to ED. FYI to [name].

## 2016-06-23 NOTE — Patient Instructions (Signed)
Complete lab work prior to leaving today. I will notify you of your results once received.   Stop by the front desk and speak with either Rosaria Ferries or Shirlean Mylar regarding your carotid ultrasound.  Start Aspirin 81 mg once daily with food.  I'll be in touch with you once I receive your results.  Please go to the hospital if your symptoms progress, you develop changes in speech, increased weakness, confusion.  It was a pleasure to see you today!

## 2016-06-23 NOTE — Assessment & Plan Note (Signed)
Present for the past 2 weeks, facial numbness that began 24 hours ago that has since resolved. Symptoms concerning for TIA. Neuro exam today unremarkable. Check bilateral carotid Dopplers. Discussed importance of cholesterol control, she is open to treatment for hyperlipidemia despite her refusal on the past. Check lipids, A1c today. If above goal, we'll treat. Start aspirin 81 mg.  Strict emergency department precautions provided.

## 2016-06-23 NOTE — Progress Notes (Signed)
Subjective:    Patient ID: Shari Vincent, female    DOB: 1932/01/30, 83 y.o.   MRN: [id number]  HPI  Shari Vincent is an 83 year old female with a history of trigeminal neuralgia, hyperlipidemia who presents today with a chief complaint of weakness.    Her weakness is located to the left upper extremity that she notices when raising her arm. She feels as though her arm months to fall back down to her lab each time she raises it. She first noticed the weakness 2 weeks ago. Last night she noticed tingling/numbness to the left cheek with resolve after several minutes. She denies changes in speech, facial drooping, acute confusion, neck pain, shoulder pain, numbness tingling to the upper extremity. She's been wearing a wrist brace to the left wrist without improvement in weakness.   She does have a history of uncontrolled hyperlipidemia and has refused statin treatment numerous times in the past. Last carotid Dopplers on file from 2013 with less than 50% stenosis bilaterally.   Review of Systems  Constitutional: Positive for fatigue.  Eyes:       No acute changes in vision  Respiratory: Negative for shortness of breath.   Cardiovascular: Negative for chest pain.  Musculoskeletal: Negative for neck pain.  Allergic/Immunologic: Positive for environmental allergies.  Neurological: Positive for weakness and numbness. Negative for headaches.       No changes in dizziness       Past Medical History:  Diagnosis Date  . Acid reflux   . Arthritis    right knee  . Bulging lumbar disc   . Cancer ([place])    Basal Cell on right shoulder  . Colon polyps   . Diverticulitis   . High cholesterol   . Irritable bowel syndrome (IBS)   . Kidney stone   . Knee pain    right  . Sciatic pain   . Trigeminal neuralgia   . Trigeminal neuralgia of right side of face   . Trigeminal neuralgia syndrome   . Vertigo      Social History   Social History  . Marital status: Married    Spouse name: N/A    . Number of children: N/A  . Years of education: N/A   Occupational History  . Not on file.   Social History Main Topics  . Smoking status: Former Smoker    Types: Cigarettes  . Smokeless tobacco: Never Used  . Alcohol use No  . Drug use: No  . Sexual activity: Not Currently    Birth control/ protection: Post-menopausal   Other Topics Concern  . Not on file   Social History Narrative   Married 62 years.   Has three children, three grandchildren, one great grandchildren.   Enjoys going to church, baking, shop.    Past Surgical History:  Procedure Laterality Date  . BREAST SURGERY     biopsy in 1990   . CATARACT EXTRACTION    . COLONOSCOPY  05/27/2008   TFT:[id number] rectal polyp,s/p bx/sigmoid diverticula. hyperplastic polyps. benign random bx  . ESOPHAGOGASTRODUODENOSCOPY  05/27/2008   CWC:BJSEGB/TDVVO HH  . EYE SURGERY    . FOOT SURGERY     Left foot, metal plate  . HYSTEROSCOPY W/D&C N/A 09/03/2014   Procedure: DILATATION AND CURETTAGE /HYSTEROSCOPY;  Surgeon: Donnamae Jude, MD;  Location: [facility];  Service: Gynecology;  Laterality: N/A;  Requesting 09/03/14 @ 2:00p  . HYSTEROSCOPY W/D&C N/A 08/19/2015   Procedure: DILATATION AND CURETTAGE /HYSTEROSCOPY;  Surgeon:  Donnamae Jude, MD;  Location: [facility];  Service: Gynecology;  Laterality: N/A;  . LITHOTRIPSY    . OTHER SURGICAL HISTORY     Uterine Prolapse/Tacking  . PROLAPSED UTERINE FIBROID LIGATION     in 1960    Family History  Problem Relation Age of Onset  . Diabetes Mother   . Heart disease Mother     MI  . Uterine cancer Mother   . Stroke Father   . Cancer Paternal Grandmother     intestinal  . Stomach cancer Maternal Grandmother   . Colon cancer Neg Hx     Allergies  Allergen Reactions  . Albuterol Rash  . Cefdinir Diarrhea  . Levofloxacin Nausea Only  . Epinephrine Anxiety  . Latex Rash    Current Outpatient Prescriptions on File Prior to Visit  Medication Sig Dispense Refill  . calcium  citrate-vitamin D (CITRACAL+D) 315-200 MG-UNIT tablet Take 1 tablet by mouth daily. Reported on 08/09/2015    . Cholecalciferol (D3 MAXIMUM STRENGTH) 5000 units capsule Take 5,000 Units by mouth daily.    Marland Kitchen gabapentin (NEURONTIN) 100 MG capsule Take 1 capsule (100 mg total) by mouth at bedtime. 90 capsule 1  . hyoscyamine (LEVSIN, ANASPAZ) 0.125 MG tablet Take 0.125 mg by mouth every 4 (four) hours as needed.    . Liniments (BLUE-EMU SUPER STRENGTH) CREA Apply 1 application topically daily as needed (For pain.).    Marland Kitchen magnesium 30 MG tablet Take 30 mg by mouth 2 (two) times daily.    . meclizine (ANTIVERT) 25 MG tablet Take 25 mg by mouth 3 (three) times daily as needed for dizziness. Reported on 05/13/2015    . Multiple Vitamin (MULTIVITAMIN WITH MINERALS) TABS tablet Take 1 tablet by mouth daily.    . OXcarbazepine (TRILEPTAL) 150 MG tablet Take 75-150 mg by mouth 2 (two) times daily. Patient takes 75 mg in the morning and 150 mg at bedtime.    . pantoprazole (PROTONIX) 20 MG tablet Take 1 tablet (20 mg total) by mouth daily before breakfast. (Patient taking differently: Take 20 mg by mouth 2 (two) times daily. ) 30 tablet 3  . Probiotic Product (PROBIOTIC DAILY PO) Take 2 tablets by mouth daily.      No current facility-administered medications on file prior to visit.     BP 136/86   Pulse 63   Temp 97.5 F (36.4 C) (Oral)   Ht 5' (1.524 m)   Wt 165 lb 12.8 oz (75.2 kg)   SpO2 96%   BMI 32.38 kg/m    Objective:   Physical Exam  Constitutional: She is oriented to person, place, and time. She appears well-nourished.  Eyes: EOM are normal. Pupils are equal, round, and reactive to light.  Neck: Neck supple.  Cardiovascular: Normal rate and regular rhythm.   Pulmonary/Chest: Effort normal and breath sounds normal.  Musculoskeletal:  Strength to upper extremities equal bilaterally. No arm drift.  Neurological: She is alert and oriented to person, place, and time. No cranial nerve  deficit. Coordination normal.  No facial drooping.  Skin: Skin is warm.          Assessment & Plan:

## 2016-06-23 NOTE — Progress Notes (Signed)
Pre visit review using our clinic review tool, if applicable. No additional management support is needed unless otherwise documented below in the visit note. 

## 2016-06-24 LAB — LIPID PANEL
Cholesterol: 226 mg/dL — ABNORMAL HIGH (ref <200)
HDL: 42 mg/dL — ABNORMAL LOW (ref >50)
LDL Cholesterol: 128 mg/dL — ABNORMAL HIGH (ref <100)
Total CHOL/HDL Ratio: 5.4 Ratio — ABNORMAL HIGH (ref <5.0)
Triglycerides: 278 mg/dL — ABNORMAL HIGH (ref <150)
VLDL: 56 mg/dL — ABNORMAL HIGH (ref <30)

## 2016-06-24 LAB — HEMOGLOBIN A1C
Hgb A1c MFr Bld: 5.7 % — ABNORMAL HIGH (ref <5.7)
Mean Plasma Glucose: 117 mg/dL

## 2016-06-25 ENCOUNTER — Inpatient Hospital Stay (HOSPITAL_COMMUNITY): Payer: Medicare Other

## 2016-06-25 ENCOUNTER — Encounter (HOSPITAL_COMMUNITY): Payer: Self-pay | Admitting: Emergency Medicine

## 2016-06-25 ENCOUNTER — Inpatient Hospital Stay (HOSPITAL_COMMUNITY)
Admission: EM | Admit: 2016-06-25 | Discharge: 2016-06-27 | DRG: 065 | Disposition: A | Discharge status: Home / Self Care | Payer: Medicare Other | Attending: Neurology | Admitting: Neurology

## 2016-06-25 DIAGNOSIS — I609 Nontraumatic subarachnoid hemorrhage, unspecified: Principal | ICD-10-CM

## 2016-06-25 DIAGNOSIS — E785 Hyperlipidemia, unspecified: Secondary | ICD-10-CM | POA: Diagnosis present

## 2016-06-25 DIAGNOSIS — Z881 Allergy status to other antibiotic agents status: Secondary | ICD-10-CM

## 2016-06-25 DIAGNOSIS — R1900 Intra-abdominal and pelvic swelling, mass and lump, unspecified site: Secondary | ICD-10-CM | POA: Diagnosis present

## 2016-06-25 DIAGNOSIS — R200 Anesthesia of skin: Secondary | ICD-10-CM

## 2016-06-25 DIAGNOSIS — K589 Irritable bowel syndrome without diarrhea: Secondary | ICD-10-CM | POA: Diagnosis present

## 2016-06-25 DIAGNOSIS — G500 Trigeminal neuralgia: Secondary | ICD-10-CM | POA: Diagnosis present

## 2016-06-25 DIAGNOSIS — Z66 Do not resuscitate: Secondary | ICD-10-CM | POA: Diagnosis present

## 2016-06-25 DIAGNOSIS — K219 Gastro-esophageal reflux disease without esophagitis: Secondary | ICD-10-CM | POA: Diagnosis present

## 2016-06-25 DIAGNOSIS — Z823 Family history of stroke: Secondary | ICD-10-CM

## 2016-06-25 DIAGNOSIS — Z888 Allergy status to other drugs, medicaments and biological substances status: Secondary | ICD-10-CM

## 2016-06-25 DIAGNOSIS — E669 Obesity, unspecified: Secondary | ICD-10-CM | POA: Diagnosis present

## 2016-06-25 DIAGNOSIS — Z91040 Latex allergy status: Secondary | ICD-10-CM

## 2016-06-25 DIAGNOSIS — E854 Organ-limited amyloidosis: Secondary | ICD-10-CM | POA: Diagnosis present

## 2016-06-25 DIAGNOSIS — Z85828 Personal history of other malignant neoplasm of skin: Secondary | ICD-10-CM | POA: Diagnosis not present

## 2016-06-25 DIAGNOSIS — G458 Other transient cerebral ischemic attacks and related syndromes: Secondary | ICD-10-CM | POA: Diagnosis present

## 2016-06-25 DIAGNOSIS — Z6831 Body mass index (BMI) 31.0-31.9, adult: Secondary | ICD-10-CM

## 2016-06-25 DIAGNOSIS — Z86010 Personal history of colonic polyps: Secondary | ICD-10-CM | POA: Diagnosis not present

## 2016-06-25 DIAGNOSIS — I680 Cerebral amyloid angiopathy: Secondary | ICD-10-CM | POA: Diagnosis present

## 2016-06-25 DIAGNOSIS — R51 Headache: Secondary | ICD-10-CM

## 2016-06-25 DIAGNOSIS — Z87891 Personal history of nicotine dependence: Secondary | ICD-10-CM

## 2016-06-25 DIAGNOSIS — N950 Postmenopausal bleeding: Secondary | ICD-10-CM | POA: Diagnosis present

## 2016-06-25 DIAGNOSIS — D269 Other benign neoplasm of uterus, unspecified: Secondary | ICD-10-CM | POA: Diagnosis present

## 2016-06-25 DIAGNOSIS — M545 Low back pain: Secondary | ICD-10-CM | POA: Diagnosis present

## 2016-06-25 DIAGNOSIS — R29700 NIHSS score 0: Secondary | ICD-10-CM | POA: Diagnosis present

## 2016-06-25 DIAGNOSIS — R209 Unspecified disturbances of skin sensation: Secondary | ICD-10-CM | POA: Diagnosis not present

## 2016-06-25 DIAGNOSIS — R29898 Other symptoms and signs involving the musculoskeletal system: Secondary | ICD-10-CM | POA: Diagnosis not present

## 2016-06-25 DIAGNOSIS — G8929 Other chronic pain: Secondary | ICD-10-CM | POA: Diagnosis present

## 2016-06-25 DIAGNOSIS — Z79899 Other long term (current) drug therapy: Secondary | ICD-10-CM

## 2016-06-25 DIAGNOSIS — I6789 Other cerebrovascular disease: Secondary | ICD-10-CM | POA: Diagnosis not present

## 2016-06-25 DIAGNOSIS — Z8249 Family history of ischemic heart disease and other diseases of the circulatory system: Secondary | ICD-10-CM

## 2016-06-25 DIAGNOSIS — R29810 Facial weakness: Secondary | ICD-10-CM | POA: Diagnosis present

## 2016-06-25 LAB — URINALYSIS, ROUTINE W REFLEX MICROSCOPIC
Bilirubin Urine: NEGATIVE
Glucose, UA: NEGATIVE mg/dL
Hgb urine dipstick: NEGATIVE
Ketones, ur: NEGATIVE mg/dL
Leukocytes, UA: NEGATIVE
Nitrite: NEGATIVE
Protein, ur: NEGATIVE mg/dL
Specific Gravity, Urine: 1.009 (ref 1.005–1.030)
pH: 7 (ref 5.0–8.0)

## 2016-06-25 LAB — CBC
HCT: 40.4 % (ref 36.0–46.0)
Hemoglobin: 13.4 g/dL (ref 12.0–15.0)
MCH: 29.5 pg (ref 26.0–34.0)
MCHC: 33.2 g/dL (ref 30.0–36.0)
MCV: 88.8 fL (ref 78.0–100.0)
Platelets: 170 10*3/uL (ref 150–400)
RBC: 4.55 MIL/uL (ref 3.87–5.11)
RDW: 13.2 % (ref 11.5–15.5)
WBC: 6.4 10*3/uL (ref 4.0–10.5)

## 2016-06-25 LAB — COMPREHENSIVE METABOLIC PANEL
ALT: 12 U/L — ABNORMAL LOW (ref 14–54)
AST: 22 U/L (ref 15–41)
Albumin: 3.9 g/dL (ref 3.5–5.0)
Alkaline Phosphatase: 55 U/L (ref 38–126)
Anion gap: 9 (ref 5–15)
BUN: 9 mg/dL (ref 6–20)
CO2: 24 mmol/L (ref 22–32)
Calcium: 9.2 mg/dL (ref 8.9–10.3)
Chloride: 106 mmol/L (ref 101–111)
Creatinine, Ser: 0.84 mg/dL (ref 0.44–1.00)
GFR calc Af Amer: >60 mL/min (ref >60)
GFR calc non Af Amer: >60 mL/min (ref >60)
Glucose, Bld: 105 mg/dL — ABNORMAL HIGH (ref 65–99)
Potassium: 4 mmol/L (ref 3.5–5.1)
Sodium: 139 mmol/L (ref 135–145)
Total Bilirubin: 0.7 mg/dL (ref 0.3–1.2)
Total Protein: 7.2 g/dL (ref 6.5–8.1)

## 2016-06-25 LAB — RAPID URINE DRUG SCREEN, HOSP PERFORMED
Amphetamines: NOT DETECTED
Barbiturates: NOT DETECTED
Benzodiazepines: NOT DETECTED
Cocaine: NOT DETECTED
Opiates: NOT DETECTED
Tetrahydrocannabinol: NOT DETECTED

## 2016-06-25 LAB — LIPID PANEL
Cholesterol: 191 mg/dL (ref 0–200)
HDL: 37 mg/dL — ABNORMAL LOW (ref >40)
LDL Cholesterol: 115 mg/dL — ABNORMAL HIGH (ref 0–99)
Total CHOL/HDL Ratio: 5.2 RATIO
Triglycerides: 193 mg/dL — ABNORMAL HIGH (ref <150)
VLDL: 39 mg/dL (ref 0–40)

## 2016-06-25 LAB — MRSA PCR SCREENING: MRSA by PCR: POSITIVE — AB

## 2016-06-25 LAB — APTT: aPTT: 34 seconds (ref 24–36)

## 2016-06-25 LAB — PROTIME-INR
INR: 1.03
Prothrombin Time: 13.5 seconds (ref 11.4–15.2)

## 2016-06-25 MED ORDER — ACETAMINOPHEN 160 MG/5ML PO SOLN: 650.0000 mg | ORAL | Status: DC | PRN

## 2016-06-25 MED ORDER — CHLORHEXIDINE GLUCONATE CLOTH 2 % EX PADS
6.0000 | MEDICATED_PAD | Freq: Every day | CUTANEOUS | Status: DC
  Administered 2016-06-26: 6 via TOPICAL

## 2016-06-25 MED ORDER — LEVETIRACETAM 500 MG PO TABS
500.0000 mg | ORAL_TABLET | Freq: Two times a day (BID) | ORAL | Status: DC
  Administered 2016-06-25 – 2016-06-27 (×5): 500 mg via ORAL
  Filled 2016-06-25 (×5): qty 1

## 2016-06-25 MED ORDER — STROKE: EARLY STAGES OF RECOVERY BOOK
Freq: Once | Status: AC
  Administered 2016-06-25: 11:00:00
  Filled 2016-06-25 (×2): qty 1

## 2016-06-25 MED ORDER — ACETAMINOPHEN 325 MG PO TABS
650.0000 mg | ORAL_TABLET | ORAL | Status: DC | PRN
  Administered 2016-06-25: 650 mg via ORAL
  Filled 2016-06-25: qty 2

## 2016-06-25 MED ORDER — PANTOPRAZOLE SODIUM 40 MG IV SOLR
40.0000 mg | Freq: Every day | INTRAVENOUS | Status: DC
  Administered 2016-06-25: 40 mg via INTRAVENOUS
  Filled 2016-06-25: qty 40

## 2016-06-25 MED ORDER — CLEVIDIPINE BUTYRATE 0.5 MG/ML IV EMUL
0.0000 mg/h | INTRAVENOUS | Status: DC
  Filled 2016-06-25: qty 50

## 2016-06-25 MED ORDER — ACETAMINOPHEN 650 MG RE SUPP: 650.0000 mg | RECTAL | Status: DC | PRN

## 2016-06-25 MED ORDER — SENNOSIDES-DOCUSATE SODIUM 8.6-50 MG PO TABS
1.0000 | ORAL_TABLET | Freq: Two times a day (BID) | ORAL | Status: DC
  Administered 2016-06-25 – 2016-06-26 (×3): 1 via ORAL
  Filled 2016-06-25 (×5): qty 1

## 2016-06-25 MED ORDER — LABETALOL HCL 5 MG/ML IV SOLN
20.0000 mg | INTRAVENOUS | Status: DC | PRN
  Administered 2016-06-25: 20 mg via INTRAVENOUS
  Filled 2016-06-25: qty 4

## 2016-06-25 MED ORDER — MUPIROCIN 2 % EX OINT
1.0000 "application " | TOPICAL_OINTMENT | Freq: Two times a day (BID) | CUTANEOUS | Status: DC
  Administered 2016-06-25 – 2016-06-27 (×5): 1 via NASAL
  Filled 2016-06-25: qty 22

## 2016-06-25 NOTE — Progress Notes (Signed)
PT Cancellation Note  Patient Details Name: Shari Vincent MRN: [id number] DOB: 10-Dec-1931   Cancelled Treatment:    Reason Eval/Treat Not Completed: Patient not medically ready (active bedrest)   Duncan Dull 06/25/2016, 8:06 AM  Alben Deeds, PT DPT  [phone]

## 2016-06-25 NOTE — ED Notes (Signed)
Patient transported to MRI 

## 2016-06-25 NOTE — ED Triage Notes (Signed)
Patient sent from [facility] for subarachnoid bleed.  Patient has been experiencing intermittent left-sided weakness and headaches for two weeks.  Was seen by primary care on 06/23/16, told to start taking 81mg  aspirin once per day.  Patient currently alert and oriented with no unilateral weakness at this time.

## 2016-06-25 NOTE — Progress Notes (Signed)
Pt stated left arm feels numb.  Previous neuro assessment pt could squeeze with left hand, but pt could not squeeze during this time. Pt can still pick up arm.  MD notified.  No new orders, will continue to observe.

## 2016-06-25 NOTE — Treatment Plan (Signed)
The patient's family member states that she is DNR/DNI.   Electronically signed: Dr. Kerney Elbe

## 2016-06-25 NOTE — H&P (Addendum)
Neurology Admission H&P  Reason for Presentation: Subarachnoid hemorrhage  CC: Left sided numbness, LUE weakness, L facial droop  HPI: This is an 38-yo RH woman who is transferred from an OSH for evaluation of a right-sided subarachnoid hemorrhage. History is obtained directly from the patient who is an excellent historian. Her daughter is present at the bedside and offers additional information as needed.   The patient reports that she has been having episodes of left-sided numbness and weakness for the past two weeks. Initially these were isolated to the left hand where she would experience numbness and tingling with some difficulty moving the arm. She states that during the episodes her arm would just fall down. Her daughter only learned of these episodes over the past week. The patient had an episode of left arm symptoms on 4/30. On 5/3, she had two episodes, one of which included tingling of the L face for the first time. Last night she had numbness and tingling of the left arm and face but for the first time also had some numbness along the outside of her left leg. Also new was a mild left facial droop. All episodes have lasted for a few minutes, no longer than 15 minutes, with complete resolution between episodes. She denies any vision loss, double vision, difficulty swallowing, vertigo, dyscoordination, gait disturbance, or right-sided deficits. She has not had headache associated with the episodes, but this morning endorses a mild bifrontal headache.   She was seen by her PCP on 5/4 and was started on aspirin 81 mg daily. She took two doses of aspirin yesterday, one in the morning with a second dose given by her daughter-in-law during last night's symptoms.   With last night's episode, family called 31 and was taken to [facility]. I have reviewed the records that accompanied the patient from [facility]. According to the RN intake note, she reported numbness of the left  face, arm, and leg that started at about 2200 last night. Symptoms reportedly worsened during EMS transport and she was also noted to have slurred speech. Symptoms resolved by the time she arrived in the ED and have not returned. She was noted to have a normal glucose by EMS. She complained of L-sided weakness and L facial droop but no deficits were present on her arrival to the ED. NIHSS score was 0. She passed a bedside swallow screen. SBP ranged from 152-181, DBP 66-86.CTH was obtained and showed a small amount of subarachnoid hemorrhage involving the right frontal convexity. CTA of the head was performed and showed no obvious aneurysm. She was transferred to [facility] for further management.   Last known well: 2200 last night NHISS score: 1 mRS score: 0 tPA given?: No, patient has SAH    PMH:  Past Medical History:  Diagnosis Date  . Acid reflux   . Arthritis    right knee  . Bulging lumbar disc   . Cancer ([place])    Basal Cell on right shoulder  . Colon polyps   . Diverticulitis   . High cholesterol   . Irritable bowel syndrome (IBS)   . Kidney stone   . Knee pain    right  . Sciatic pain   . Trigeminal neuralgia   . Trigeminal neuralgia of right side of face   . Trigeminal neuralgia syndrome   . Vertigo     PSH:  Past Surgical History:  Procedure Laterality Date  . BREAST SURGERY     biopsy in  1990   . CATARACT EXTRACTION    . COLONOSCOPY  05/27/2008   ZOX:[id number] rectal polyp,s/p bx/sigmoid diverticula. hyperplastic polyps. benign random bx  . ESOPHAGOGASTRODUODENOSCOPY  05/27/2008   JXB:JYNWGN/FAOZH HH  . EYE SURGERY    . FOOT SURGERY     Left foot, metal plate  . HYSTEROSCOPY W/D&C N/A 09/03/2014   Procedure: DILATATION AND CURETTAGE /HYSTEROSCOPY;  Surgeon: Donnamae Jude, MD;  Location: [facility];  Service: Gynecology;  Laterality: N/A;  Requesting 09/03/14 @ 2:00p  . HYSTEROSCOPY W/D&C N/A 08/19/2015   Procedure: DILATATION AND CURETTAGE /HYSTEROSCOPY;  Surgeon:  Donnamae Jude, MD;  Location: [place] [place] [facility];  Service: Gynecology;  Laterality: N/A;  . LITHOTRIPSY    . OTHER SURGICAL HISTORY     Uterine Prolapse/Tacking  . PROLAPSED UTERINE FIBROID LIGATION     in 1960    Family history: Family History  Problem Relation Age of Onset  . Diabetes Mother   . Heart disease Mother     MI  . Uterine cancer Mother   . Stroke Father   . Cancer Paternal Grandmother     intestinal  . Stomach cancer Maternal Grandmother   . Colon cancer Neg Hx     Social history:  Social History   Social History  . Marital status: Married    Spouse name: N/A  . Number of children: N/A  . Years of education: N/A   Occupational History  . Not on file.   Social History Main Topics  . Smoking status: Former Smoker    Types: Cigarettes  . Smokeless tobacco: Never Used  . Alcohol use No  . Drug use: No  . Sexual activity: Not Currently    Birth control/ protection: Post-menopausal   Other Topics Concern  . Not on file   Social History Narrative   Married 62 years.   Has three children, three grandchildren, one great grandchildren.   Enjoys going to church, baking, shop.    Current inpatient meds: Medications reviewed and reconciled Current Facility-Administered Medications  Medication Dose Route Frequency Provider Last Rate Last Dose  .  stroke: mapping our early stages of recovery book   Does not apply Once Kerney Elbe, MD      . acetaminophen (TYLENOL) tablet 650 mg  650 mg Oral Q4H PRN Kerney Elbe, MD       Or  . acetaminophen (TYLENOL) solution 650 mg  650 mg Per Tube Q4H PRN Kerney Elbe, MD       Or  . acetaminophen (TYLENOL) suppository 650 mg  650 mg Rectal Q4H PRN Kerney Elbe, MD      . labetalol (NORMODYNE,TRANDATE) injection 20 mg  20 mg Intravenous Q20 Min PRN Kerney Elbe, MD       And  . clevidipine (CLEVIPREX) infusion 0.5 mg/mL  0-21 mg/hr Intravenous Continuous Kerney Elbe, MD   Stopped at 06/25/16 [phone]  . pantoprazole  (PROTONIX) injection 40 mg  40 mg Intravenous QHS Kerney Elbe, MD      . senna-docusate (Senokot-S) tablet 1 tablet  1 tablet Oral BID Kerney Elbe, MD       Current Outpatient Prescriptions  Medication Sig Dispense Refill  . calcium citrate-vitamin D (CITRACAL+D) 315-200 MG-UNIT tablet Take 1 tablet by mouth daily. Reported on 08/09/2015    . Cholecalciferol (D3 MAXIMUM STRENGTH) 5000 units capsule Take 5,000 Units by mouth daily.    Marland Kitchen gabapentin (NEURONTIN) 100 MG capsule Take 1 capsule (100 mg total) by mouth at bedtime. 90 capsule 1  .  hyoscyamine (LEVSIN, ANASPAZ) 0.125 MG tablet Take 0.125 mg by mouth every 4 (four) hours as needed.    . Liniments (BLUE-EMU SUPER STRENGTH) CREA Apply 1 application topically daily as needed (For pain.).    Marland Kitchen magnesium 30 MG tablet Take 30 mg by mouth 2 (two) times daily.    . meclizine (ANTIVERT) 25 MG tablet Take 25 mg by mouth 3 (three) times daily as needed for dizziness. Reported on 05/13/2015    . Multiple Vitamin (MULTIVITAMIN WITH MINERALS) TABS tablet Take 1 tablet by mouth daily.    . OXcarbazepine (TRILEPTAL) 150 MG tablet Take 75-150 mg by mouth 2 (two) times daily. Patient takes 75 mg in the morning and 150 mg at bedtime.    . pantoprazole (PROTONIX) 20 MG tablet Take 1 tablet (20 mg total) by mouth daily before breakfast. (Patient taking differently: Take 20 mg by mouth 2 (two) times daily. ) 30 tablet 3  . Probiotic Product (PROBIOTIC DAILY PO) Take 2 tablets by mouth daily.       Allergies: Allergies  Allergen Reactions  . Albuterol Rash  . Cefdinir Diarrhea  . Levofloxacin Nausea Only  . Epinephrine Anxiety  . Latex Rash    ROS: As per HPI. A full 14-point review of systems was performed and is otherwise notable for some chronic low back pain.   PE:  BP 139/75   Pulse 67   Temp 98.1 F (36.7 C) (Oral)   Resp 16   SpO2 96%   General: WDWN, no acute distress. AAO x4. Speech clear, no dysarthria. No aphasia. Follows commands  briskly. Affect is bright with congruent mood. Comportment is normal.  HEENT: Normocephalic. Neck supple without LAD. MMM, OP clear. Dentition good. Sclerae anicteric. No conjunctival injection.  CV: Regular, no murmur. Carotid pulses full and symmetric, no bruits. Distal pulses 2+ and symmetric.  Lungs: CTAB.  Abdomen: Soft, non-distended, non-tender. Bowel sounds present x4.  Extremities: No C/C/E. Neuro:  CN: Pupils are equal and round. They are symmetrically reactive from 3-->2 mm. EOMI without nystagmus. No reported diplopia. Facial sensation is intact to light touch. Face is symmetric at rest with normal strength and mobility. Hearing is intact to conversational voice. Palate elevates symmetrically and uvula is midline. Voice is normal in tone, pitch and quality. Bilateral SCM and trapezii are 5/5. Tongue is midline with normal bulk and mobility.  Motor: Normal bulk, tone, and strength. No tremor or other abnormal movements. No drift.  Sensation: Intact to light touch. She has mildly diminished pinprick over the left arm and leg.  DTRs: 2+, symmetric. Toes downgoing bilaterally. No pathologic reflexes.  Coordination: Finger-to-nose and heel-to-shin are without dysmetria. Finger taps are normal in amplitude and speed, no decrement.    Labs:  Lab Results  Component Value Date   WBC 6.9 08/12/2015   HGB 13.1 08/12/2015   HCT 39.2 08/12/2015   PLT 185 08/12/2015   GLUCOSE 91 08/12/2015   CHOL 226 (H) 06/23/2016   TRIG 278 (H) 06/23/2016   HDL 42 (L) 06/23/2016   LDLDIRECT 60.0 01/10/2016   LDLCALC 128 (H) 06/23/2016   ALT 17 07/07/2015   AST 20 07/07/2015   NA 139 08/12/2015   K 3.8 08/12/2015   CL 105 08/12/2015   CREATININE 0.73 08/12/2015   BUN 12 08/12/2015   CO2 28 08/12/2015   TSH 1.14 06/17/2014   HGBA1C 5.7 (H) 06/23/2016    Imaging:  I have personally and independently reviewed the [facility] without contrast from the  OSH. This shows a small amount of subarachnoid  hemorrhage over the right frontal convexity involving the R central sulcus. There is no associated edema or ischemia. She has mild to moderate diffuse generalized atrophy with ex-vacuo dilatation of the ventricles. There appears to be a moderate degree of hypodensity in the bihemispheric white matter that is most consistent with chronic small vessel disease. Atherosclerotic calcifications are noted in both carotids.   I have personally and independently reviewed the CTA head from the OSH. There is no apparent aneurysm. Subarachnoid hemorrhage is again noted over the right frontal convexity. There appears to be relative prominence of vessels surrounding the area of hemorrhage, cannot exclude small vascular malformation.   Assessment and Plan:  1. Acute SAH: Hunt and Hess score is 1. There is no apparent aneurysm on CTA, with location not typical of an aneurysmal bleed. She denies any known head trauma, though minor trauma could cause hemorrhage even if not significant enough for the patient to register the injury. Other potential considerations include AVM, sympathomimetic abuse (patient denies), and complication from anticoagulation or antiplatelet therapy (sounds as if she had symptoms prior to starting aspirin). Will admit to neuro ICU. Check CBC, CMP, coags--no labs were sent with the patient from the OSH. Check urine drug screen. Monitor blood pressure, goal SBP 140-160 mmHg. Avoid antiplatelet agents and NSAIDs for the 1-2 weeks. Avoid therapeutic systemic anticoagulation for the next 3-4 weeks. Will order MRI brain and MRA head for further evaluation. Use SCDs for DVT prophylaxis. Avoid fever and hyperglycemia as these are associated with worse neurologic prognosis. Frequent neuro checks. Will cover empirically with Keppra 500 mg BID for seven days, can stop then if no seizures.   2. Left hemisensory loss: This is acute. Must consider ischemic infarction but could simply be reflective of SAH. MRI brain  ordered for further evaluation. PT/OT to evaluate and treat.   3. Left arm weakness: This was transient, no symptoms on my exam. Follow.   4. Headache: She reports a mild bifrontal HA, not clear if this is directly related to her SAH. Symptomatic treatment as needed.   This was discussed with the patient and her daughter. Education was provided on the diagnosis and expected evaluation and treatment. They are in agreement with the plan as noted. They were given the opportunity to ask any questions and these were addressed to their satisfaction.   Code status: Patient is DNR per her own request.

## 2016-06-26 ENCOUNTER — Encounter (HOSPITAL_COMMUNITY): Payer: Self-pay | Admitting: *Deleted

## 2016-06-26 ENCOUNTER — Inpatient Hospital Stay (HOSPITAL_COMMUNITY): Payer: Medicare Other

## 2016-06-26 DIAGNOSIS — I609 Nontraumatic subarachnoid hemorrhage, unspecified: Principal | ICD-10-CM

## 2016-06-26 DIAGNOSIS — R209 Unspecified disturbances of skin sensation: Secondary | ICD-10-CM

## 2016-06-26 DIAGNOSIS — I680 Cerebral amyloid angiopathy: Secondary | ICD-10-CM | POA: Diagnosis present

## 2016-06-26 LAB — HEMOGLOBIN A1C
Hgb A1c MFr Bld: 5.9 % — ABNORMAL HIGH (ref 4.8–5.6)
Mean Plasma Glucose: 123 mg/dL

## 2016-06-26 MED ORDER — GABAPENTIN 100 MG PO CAPS
100.0000 mg | ORAL_CAPSULE | Freq: Every day | ORAL | Status: DC
  Administered 2016-06-26: 100 mg via ORAL
  Filled 2016-06-26: qty 1

## 2016-06-26 MED ORDER — PANTOPRAZOLE SODIUM 40 MG PO TBEC
40.0000 mg | DELAYED_RELEASE_TABLET | Freq: Every day | ORAL | Status: DC
  Administered 2016-06-26: 40 mg via ORAL
  Filled 2016-06-26: qty 1

## 2016-06-26 MED ORDER — CHLORHEXIDINE GLUCONATE CLOTH 2 % EX PADS
6.0000 | MEDICATED_PAD | Freq: Every day | CUTANEOUS | Status: DC
  Administered 2016-06-27: 6 via TOPICAL

## 2016-06-26 MED ORDER — OXCARBAZEPINE 150 MG PO TABS
150.0000 mg | ORAL_TABLET | Freq: Every day | ORAL | Status: DC
  Administered 2016-06-26: 150 mg via ORAL
  Filled 2016-06-26 (×2): qty 1

## 2016-06-26 MED ORDER — LABETALOL HCL 5 MG/ML IV SOLN: 10.0000 mg | INTRAVENOUS | Status: DC | PRN

## 2016-06-26 NOTE — Progress Notes (Signed)
PT Cancellation Note  Patient Details Name: Shari Vincent MRN: [id number] DOB: [birth year]   Cancelled Treatment:    Reason Eval/Treat Not Completed: Patient not medically ready Pt on bedrest. Will await increase in activity orders prior to PT evaluation.    [place] 06/26/2016, 9:14 AM Wray Kearns, PT, DPT [phone]

## 2016-06-26 NOTE — Evaluation (Signed)
Occupational Therapy Evaluation Patient Details Name: Shari Vincent MRN: [id number] DOB: [birth year] Today's Date: 06/26/2016    History of Present Illness Patient is a 83 y/o female who presents to [facility] with LUE numbness, weakness and facial droop. Head CT-right SAH in right frontal convexity. There appears to be relative prominence of vessels surrounding the area of hemorrhage, cannot exclude small vascular malformation.    Clinical Impression   PTA, pt was independent with basic ADL and functional mobility and required intermittent assistance for home management tasks. Pt currently requires min guard assist for toilet transfers and LB ADL. She presents with decreased activity tolerance for ADL limiting ability to participate at [facility]. Pt would benefit from continued OT services while admitted to improve independence with ADL and functional mobility. Feel pt will progress well and do not anticipate need for OT follow-up post-acute D/C. OT will continue to follow acutely.    Follow Up Recommendations  No OT follow up;Supervision/Assistance - 24 hour    Equipment Recommendations  None recommended by OT    Recommendations for Other Services       Precautions / Restrictions Precautions Precautions: Fall Restrictions Weight Bearing Restrictions: No      Mobility Bed Mobility               General bed mobility comments: Up in chair upon OT arrival.   Transfers Overall transfer level: Needs assistance Equipment used: None Transfers: Sit to/from Stand Sit to Stand: Min guard         General transfer comment: Slow to stand from low chair due to weakness but no assist needed.    Balance Overall balance assessment: Needs assistance Sitting-balance support: Feet supported;No upper extremity supported Sitting balance-Leahy Scale: Good     Standing balance support: During functional activity;Single extremity supported Standing balance-Leahy Scale: Fair Standing balance  comment: Able to statically stand without UE support during ADL.                            ADL either performed or assessed with clinical judgement   ADL Overall ADL's : Needs assistance/impaired Eating/Feeding: Set up;Sitting   Grooming: Supervision/safety;Standing;Wash/dry hands;Wash/dry face;Oral care;Brushing hair   Upper Body Bathing: Set up;Sitting   Lower Body Bathing: Sit to/from stand;Min guard   Upper Body Dressing : Set up;Sitting   Lower Body Dressing: Min guard;Sit to/from stand   Toilet Transfer: Ambulation;Min guard   Toileting- Water quality scientist and Hygiene: Supervision/safety;Sit to/from stand       Functional mobility during ADLs: Min guard General ADL Comments: Pt able to sequence and problem solve well during ADL.      Vision Baseline Vision/History:  (L eye blurry vision) Patient Visual Report: No change from baseline Vision Assessment?: Yes Eye Alignment: Within Functional Limits Ocular Range of Motion: Within Functional Limits Alignment/Gaze Preference: Within Defined Limits Tracking/Visual Pursuits: Decreased smoothness of vertical tracking Saccades: Within functional limits Convergence: Within functional limits Visual Fields: No apparent deficits Additional Comments: Decreased smoothness with horizontal tracking and decreased ability to maintain gaze at bilateral end ranges.     Perception     Praxis Praxis Praxis tested?: Within functional limits    Pertinent Vitals/Pain Pain Assessment: No/denies pain     Hand Dominance     Extremity/Trunk Assessment Upper Extremity Assessment Upper Extremity Assessment: Overall WFL for tasks assessed   Lower Extremity Assessment Lower Extremity Assessment: Generalized weakness       Communication Communication Communication: No  difficulties   Cognition Arousal/Alertness: Awake/alert Behavior During Therapy: WFL for tasks assessed/performed Overall Cognitive Status: Within  Functional Limits for tasks assessed                                 General Comments: Able to problem solve appropriately.    General Comments  VSS    Exercises     Shoulder Instructions      Home Living Family/patient expects to be discharged to:: Private residence Living Arrangements: Spouse/significant other Available Help at Discharge: Family;Available 24 hours/day Type of Home: House Home Access: Level entry     Home Layout: Two level;Laundry or work area in Building surveyor of Steps: 10 stairs (where laundry room is) Alternate Level Stairs-Rails: Right Bathroom Shower/Tub: Teacher, early years/pre: Handicapped height     Home Equipment: Environmental consultant - 2 wheels;Shower seat          Prior Functioning/Environment Level of Independence: Independent        Comments: Helps care for spouse. Does not drive. Daughter helps with vacuuming. Pt does cooking, cleaning and laundry. No falls. USes RW as needed when dizzy.        OT Problem List: Decreased strength;Decreased activity tolerance;Impaired balance (sitting and/or standing);Decreased safety awareness;Decreased knowledge of use of DME or AE;Decreased knowledge of precautions      OT Treatment/Interventions: Self-care/ADL training;Therapeutic exercise;Energy conservation;DME and/or AE instruction;Therapeutic activities;Patient/family education;Balance training    OT Goals(Current goals can be found in the care plan section) Acute Rehab OT Goals Patient Stated Goal: to go home OT Goal Formulation: With patient Time For Goal Achievement: 07/10/16 Potential to Achieve Goals: Good  OT Frequency: Min 2X/week   Barriers to D/C:            Co-evaluation              AM-PAC PT "6 Clicks" Daily Activity     Outcome Measure Help from another person eating meals?: None Help from another person taking care of personal grooming?: A Little Help from another person  toileting, which includes using toliet, bedpan, or urinal?: A Little Help from another person bathing (including washing, rinsing, drying)?: A Little Help from another person to put on and taking off regular upper body clothing?: None Help from another person to put on and taking off regular lower body clothing?: A Little 6 Click Score: 20   End of Session Equipment Utilized During Treatment: Gait belt Nurse Communication: Mobility status  Activity Tolerance: Patient tolerated treatment well Patient left: in chair;with call bell/phone within reach;with family/visitor present  OT Visit Diagnosis: Unsteadiness on feet (R26.81);Muscle weakness (generalized) (M62.81)                Time: 8101-7510 OT Time Calculation (min): 20 min Charges:  OT General Charges $OT Visit: 1 Procedure OT Evaluation $OT Eval Moderate Complexity: 1 Procedure G-Codes:     Norman Herrlich, MS OTR/L  Pager: [place] A Nyelah Emmerich 06/26/2016, 11:08 AM

## 2016-06-26 NOTE — Progress Notes (Signed)
STROKE TEAM PROGRESS NOTE   HISTORY OF PRESENT ILLNESS (per record) This is an 83-yo RH woman who is transferred from an OSH for evaluation of a right-sided subarachnoid hemorrhage. History is obtained directly from the patient who is an excellent historian. Her daughter is present at the bedside and offers additional information as needed.   The patient reports that she has been having episodes of left-sided numbness and weakness for the past two weeks. Initially these were isolated to the left hand where she would experience numbness and tingling with some difficulty moving the arm. She states that during the episodes her arm would just fall down. Her daughter only learned of these episodes over the past week. The patient had an episode of left arm symptoms on 4/30. On 5/3, she had two episodes, one of which included tingling of the L face for the first time. Last night she had numbness and tingling of the left arm and face but for the first time also had some numbness along the outside of her left leg. Also new was a mild left facial droop. All episodes have lasted for a few minutes, no longer than 15 minutes, with complete resolution between episodes. She denies any vision loss, double vision, difficulty swallowing, vertigo, dyscoordination, gait disturbance, or right-sided deficits. She has not had headache associated with the episodes, but this morning endorses a mild bifrontal headache.   She was seen by her PCP on 5/4 and was started on aspirin 81 mg daily. She took two doses of aspirin yesterday, one in the morning with a second dose given by her daughter-in-law during last night's symptoms.   With last night's episode, family called 69 and was taken to [facility]. I have reviewed the records that accompanied the patient from [facility]. According to the RN intake note, she reported numbness of the left face, arm, and leg that started at about 2200 last night. Symptoms  reportedly worsened during EMS transport and she was also noted to have slurred speech. Symptoms resolved by the time she arrived in the ED and have not returned. She was noted to have a normal glucose by EMS. She complained of L-sided weakness and L facial droop but no deficits were present on her arrival to the ED. NIHSS score was 0. She passed a bedside swallow screen. SBP ranged from 152-181, DBP 66-86.CTH was obtained and showed a small amount of subarachnoid hemorrhage involving the right frontal convexity. CTA of the head was performed and showed no obvious aneurysm. She was transferred to [facility] for further management.   Last known well:  06/24/2016 at 2200 NHISS score: 1 mRS score: 0  Patient was not administered IV t-PA secondary to [facility]. She was admitted to the neuro ICU for further evaluation and treatment.   SUBJECTIVE (INTERVAL HISTORY) Son at bedside. Pt stated that in 2012 she had right HA with eye pain and had MRI in [place] showed left frontal small SAH. Was told to have TN and started on trileptal but super low dose until now. Pt had no TN symptoms. This time pt had intermittent left arm numbness weakness, left facial numbness and weakness for the last 1-2 weeks. CT and MRI showed right small SAH and left hemosiderosis. Pt condition consistent with CAA with FTNE.    OBJECTIVE Temp:  [97.8 F (36.6 C)-98.5 F (36.9 C)] 98 F (36.7 C) (05/07 0400) Pulse Rate:  [58-74] 64 (05/07 0700) Cardiac Rhythm: Normal sinus rhythm (05/07 0700)  Resp:  [12-22] 18 (05/07 0700) BP: (83-160)/(44-103) 114/62 (05/07 0700) SpO2:  [90 %-99 %] 97 % (05/07 0700) Weight:  [72.9 kg (160 lb 11.5 oz)] 72.9 kg (160 lb 11.5 oz) (05/06 1000)  CBC:  Recent Labs Lab 06/25/16 0820  WBC 6.4  HGB 13.4  HCT 40.4  MCV 88.8  PLT 347    Basic Metabolic Panel:  Recent Labs Lab 06/25/16 0820  NA 139  K 4.0  CL 106  CO2 24  GLUCOSE 105*  BUN 9  CREATININE 0.84  CALCIUM 9.2    Lipid  Panel:    Component Value Date/Time   CHOL 191 06/25/2016 0820   TRIG 193 (H) 06/25/2016 0820   HDL 37 (L) 06/25/2016 0820   CHOLHDL 5.2 06/25/2016 0820   VLDL 39 06/25/2016 0820   LDLCALC 115 (H) 06/25/2016 0820   HgbA1c:  Lab Results  Component Value Date   HGBA1C 5.9 (H) 06/25/2016   Urine Drug Screen:    Component Value Date/Time   LABOPIA NONE DETECTED 06/25/2016 1200   COCAINSCRNUR NONE DETECTED 06/25/2016 1200   LABBENZ NONE DETECTED 06/25/2016 1200   AMPHETMU NONE DETECTED 06/25/2016 1200   THCU NONE DETECTED 06/25/2016 1200   LABBARB NONE DETECTED 06/25/2016 1200    Alcohol Level No results found for: [place] I have personally reviewed the radiological images below and agree with the radiology interpretations.  CT head (no images available for review)  was obtained and showed a small amount of subarachnoid hemorrhage involving the right frontal convexity.   CTA of the head (no images available for review) was performed and showed no obvious aneurysm. She was transferred to [facility] for further management.   Mr Brain 64 Contrast Mr Angiogram Head Wo Contrast 06/25/2016 1. Trace subarachnoid hemorrhage along the right superior frontal convexity seen in two adjacent sulci. No associated acute infarct or other acute signal abnormality in the region. 2. 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 No other chronic cerebral blood products or superficial siderosis identified. 3. Intracranial MRA appears stable since 2012 with no intracranial stenosis or aneurysm identified. The entire area of subarachnoid hemorrhage was not covered by MRA but no cerebral AVM is suspected. 4. Chronically advanced and mildly progressed nonspecific cerebral white matter signal changes since 2012. There are also two small areas of cortical encephalomalacia in the right inferior frontal and  right posterior parietal lobe which are new since 2012. Several tiny chronic cerebellar lacunar infarcts are also new.   2D Echo pending  CUS pending  EEG This is an abnormal EEG demonstrating a very mild diffuse slowing of electrocerebral activity.  This can be seen in a wide variety of encephalopathic state including those of a toxic, metabolic, or degenerative nature.  There were no focal, hemispheric, or lateralizing features.  No epileptiform activity was recorded.   PHYSICAL EXAM  Temp:  [97.7 F (36.5 C)-98.5 F (36.9 C)] 98.5 F (36.9 C) (05/07 1200) Pulse Rate:  [64-74] 66 (05/07 0800) Resp:  [13-23] 17 (05/07 0900) BP: (93-138)/(44-91) 133/91 (05/07 0900) SpO2:  [90 %-98 %] 94 % (05/07 0800)  General - Well nourished, well developed, in no apparent distress.  Ophthalmologic - Fundi not visualized due to small pupils.  Cardiovascular - Regular rate and rhythm.  Mental Status -  Level of arousal and orientation to time, place, and person were intact. Language including expression, naming,  repetition, comprehension was assessed and found intact. Fund of Knowledge was assessed and was intact.  Cranial Nerves II - XII - II - Visual field intact OU. III, IV, VI - Extraocular movements intact. V - Facial sensation intact bilaterally. VII - Facial movement intact bilaterally. VIII - Hearing & vestibular intact bilaterally. X - Palate elevates symmetrically. XI - Chin turning & shoulder shrug intact bilaterally. XII - Tongue protrusion intact.  Motor Strength - The patient's strength was normal in all extremities and pronator drift was absent.  Bulk was normal and fasciculations were absent.   Motor Tone - Muscle tone was assessed at the neck and appendages and was normal.  Reflexes - The patient's reflexes were 1+ in all extremities and she had no pathological reflexes.  Sensory - Light touch, temperature/pinprick were assessed and were symmetrical.    Coordination  - The patient had normal movements in the hands with no ataxia or dysmetria.  Tremor was absent.  Gait and Station - deferred.   ASSESSMENT/PLAN Ms. Shari Vincent is a 83 y.o. female with history of HLD, R trigeminal neuroalgia, GERD, diverticulitis presenting to [facility] with L sided numbness, LUE weakness, L facial droop. CT showed a SAH. 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    Resultant  Focal transient neurological episodes  MRI  Trace R frontal SAH. Prior L frontal convexity siderosis. New cortical encephalomalacia since 2012 R fin frontal and R post parietal. Old Tiny cerebellar lacunes.  MRA  Unremarkable   Carotid Doppler  pending  2D Echo  pending  EEG very mild diffuse slowing  LDL 115  HgbA1c 5.9  SCDs for VTE prophylaxis  Diet regular Room service appropriate? Yes; Fluid consistency: Thin  aspirin 81 mg x 1 day prior to admission, now on No antithrombotic given SAH. Due to likely CAA diagnosis, will avoid antiplatelet or anticoagulation in the future.  Ongoing aggressive stroke risk factor management  Therapy recommendations:  pending   Disposition:  pending   Focal transient neurological episodes (FTNEs)  Part of the presentation of CAA  On keppra 500mg  bid  Consider increase dose if continued episodes  Also can consider increase trileptal dose if continued episodes  Hyperlipidemia  Home meds:  No statin  LDL 115  No need to start stain given SAH and no statin at baseline  Other Stroke Risk Factors  Advanced age  Former Cigarette smoker  Obesity, Body mass index is 31.39 kg/m., recommend weight loss, diet and exercise as appropriate   Hx right eye pain and HA in 2012, MRI showed left frontal hemosiderosis, saw Dr. Merlene Laughter who dx trigeminal neuralgia at that time. Treated with very low dose trileptal until  now. Pt has no symptoms since   Family hx stroke (father)  Other Active Problems  Ovarian fibroma causing postmenopausal bleeding  Hospital day # 1  This patient is critically ill due to small SAH, and episodic symptoms and at significant risk of neurological worsening, death form recurrent SAH, brain bleeding. This patient's care requires constant monitoring of vital signs, hemodynamics, respiratory and cardiac monitoring, review of multiple databases, neurological assessment, discussion with family, other specialists and medical decision making of high complexity. I spent 40 minutes of neurocritical care time in the care of this patient.  Rosalin Hawking, MD PhD Stroke Neurology 06/26/2016 1:37 PM   To contact Stroke Continuity provider, please refer to  [email or web address]. After hours, contact General Neurology  

## 2016-06-26 NOTE — Procedures (Signed)
HPI:  83 year old with episodes of left-sided numbness and weakness.  TECHNICAL SUMMARY:  A multichannel referential and bipolar montage EEG using the standard international 10-20 system was performed on the patient described as awake, drowsy and asleep.  The dominant background activity consists of 7.5-8 hertz activity seen most prominantly over the posterior head region.  The backgound activity is nonreactive to eye opening and closing procedures.  Low voltage fast (beta) activity is distributed symmetrically and maximally over the anterior head regions.  ACTIVATION:  Stepwise photic stimulation and hyperventilation are not performed  EPILEPTIFORM ACTIVITY:  There were no spikes, sharp waves or paroxysmal activity.  SLEEP:  Stage I and stage II sleep architecture identified.  IMPRESSION:  This is an abnormal EEG demonstrating a very mild diffuse slowing of electrocerebral activity.  This can be seen in a wide variety of encephalopathic state including those of a toxic, metabolic, or degenerative nature.  There were no focal, hemispheric, or lateralizing features.  No epileptiform activity was recorded.

## 2016-06-26 NOTE — Evaluation (Signed)
Physical Therapy Evaluation Patient Details Name: Shari Vincent MRN: [id number] DOB: 1931-03-25 Today's Date: 06/26/2016   History of Present Illness  Patient is a 83 y/o female who presents to [facility] with LUE numbness, weakness and facial droop. Head CT-right SAH in right frontal convexity. There appears to be relative prominence of vessels surrounding the area of hemorrhage, cannot exclude small vascular malformation.   Clinical Impression  Patient presents close to functional baseline with mild weakness and balance deficits possibly from being in hospital. Tolerated gait training with Min A for balance/safety but anticipate this will improve with increased mobility. Demonstrates mild balance deficits requiring support at this time. Recommend use of RW for support next session. Will plan to perform higher level balance challenges and stair training next session as tolerated as pt independent PTA and helps care for spouse. Will follow acutely.    Follow Up Recommendations No PT follow up;Supervision for mobility/OOB    Equipment Recommendations  None recommended by PT    Recommendations for Other Services Speech consult     Precautions / Restrictions Precautions Precautions: Fall Restrictions Weight Bearing Restrictions: No      Mobility  Bed Mobility               General bed mobility comments: Up in chair upon PT arrival.   Transfers Overall transfer level: Needs assistance Equipment used: None Transfers: Sit to/from Stand Sit to Stand: Min guard         General transfer comment: Slow to stand from low chair due to weakness but no assist needed.  Ambulation/Gait Ambulation/Gait assistance: Min assist Ambulation Distance (Feet): 150 Feet Assistive device: 1 person hand held assist Gait Pattern/deviations: Step-through pattern;Decreased stride length;Narrow base of support Gait velocity: decreased Gait velocity interpretation: Below normal speed for  age/gender General Gait Details: Slow, mildly unsteady gait with pt holding onto therapist's back for support- masking symptoms perhaps. 2/4 DOE. Might do better with RW.  Stairs            Wheelchair Mobility    Modified Rankin (Stroke Patients Only) Modified Rankin (Stroke Patients Only) Pre-Morbid Rankin Score: No significant disability Modified Rankin: Moderately severe disability     Balance Overall balance assessment: Needs assistance Sitting-balance support: Feet supported;No upper extremity supported Sitting balance-Leahy Scale: Good     Standing balance support: During functional activity;Single extremity supported Standing balance-Leahy Scale: Fair Standing balance comment: Needs UE support for standing balance.                              Pertinent Vitals/Pain Pain Assessment: No/denies pain    Home Living Family/patient expects to be discharged to:: Private residence Living Arrangements: Spouse/significant other Available Help at Discharge: Family;Available 24 hours/day Type of Home: House Home Access: Level entry     Home Layout: Two level;Laundry or work area in [place]: Environmental consultant - 2 wheels;Shower seat      Prior Function Level of Independence: Independent         Comments: Helps care for spouse. Does not drive. Daughter helps with vacuuming. Pt does cooking, cleaning and laundry. No falls. USes RW as needed when dizzy.     Hand Dominance        Extremity/Trunk Assessment   Upper Extremity Assessment Upper Extremity Assessment: Defer to OT evaluation    Lower Extremity Assessment Lower Extremity Assessment: Generalized weakness (from being in bed)       Communication  Communication: No difficulties  Cognition Arousal/Alertness: Awake/alert Behavior During Therapy: WFL for tasks assessed/performed Overall Cognitive Status:  (age appropriate)                                         General Comments General comments (skin integrity, edema, etc.): VSS.    Exercises     Assessment/Plan    PT Assessment Patient needs continued PT services  PT Problem List Decreased strength;Decreased mobility;Decreased activity tolerance;Decreased balance       PT Treatment Interventions Therapeutic activities;Gait training;Therapeutic exercise;Functional mobility training;Stair training;Balance training;DME instruction;Patient/family education;Neuromuscular re-education    PT Goals (Current goals can be found in the Care Plan section)  Acute Rehab PT Goals Patient Stated Goal: to go home PT Goal Formulation: With patient Time For Goal Achievement: 07/10/16 Potential to Achieve Goals: Good    Frequency Min 4X/week   Barriers to discharge        Co-evaluation               AM-PAC PT "6 Clicks" Daily Activity  Outcome Measure Difficulty turning over in bed (including adjusting bedclothes, sheets and blankets)?: None Difficulty moving from lying on back to sitting on the side of the bed? : None Difficulty sitting down on and standing up from a chair with arms (e.g., wheelchair, bedside commode, etc,.)?: None Help needed moving to and from a bed to chair (including a wheelchair)?: A Little Help needed walking in hospital room?: A Little Help needed climbing 3-5 steps with a railing? : A Little 6 Click Score: 21    End of Session Equipment Utilized During Treatment: Gait belt Activity Tolerance: Patient tolerated treatment well Patient left: in chair;with call bell/phone within reach;with family/visitor present;Other (comment) (OT present) Nurse Communication: Mobility status PT Visit Diagnosis: Unsteadiness on feet (R26.81);Muscle weakness (generalized) (M62.81)    Time: 2633-3545 PT Time Calculation (min) (ACUTE ONLY): 19 min   Charges:   PT Evaluation $PT Eval Low Complexity: 1 Procedure     PT G Codes:        Wray Kearns, PT,  DPT [id number]    [place] 06/26/2016, 10:47 AM

## 2016-06-26 NOTE — Progress Notes (Signed)
EEG completed; results pending.    

## 2016-06-26 NOTE — Progress Notes (Signed)
Received from 4NICU via wheelchair; patient is alert and oriented; family present at bedside; oriented patient and family to unit and unit routine; patient denies pain or any tingling or numbness presently.

## 2016-06-27 ENCOUNTER — Inpatient Hospital Stay (HOSPITAL_COMMUNITY): Payer: Medicare Other

## 2016-06-27 ENCOUNTER — Ambulatory Visit: Payer: Medicare Other | Admitting: Internal Medicine

## 2016-06-27 ENCOUNTER — Ambulatory Visit (HOSPITAL_COMMUNITY): Payer: Medicare Other

## 2016-06-27 DIAGNOSIS — Z823 Family history of stroke: Secondary | ICD-10-CM

## 2016-06-27 DIAGNOSIS — G458 Other transient cerebral ischemic attacks and related syndromes: Secondary | ICD-10-CM | POA: Diagnosis present

## 2016-06-27 DIAGNOSIS — E785 Hyperlipidemia, unspecified: Secondary | ICD-10-CM

## 2016-06-27 DIAGNOSIS — I6789 Other cerebrovascular disease: Secondary | ICD-10-CM

## 2016-06-27 DIAGNOSIS — I680 Cerebral amyloid angiopathy: Secondary | ICD-10-CM

## 2016-06-27 HISTORY — DX: Other transient cerebral ischemic attacks and related syndromes: G45.8

## 2016-06-27 LAB — ECHOCARDIOGRAM COMPLETE
Height: 60 in
Weight: 2571.45 oz

## 2016-06-27 MED ORDER — LEVETIRACETAM 500 MG PO TABS: 500.0000 mg | ORAL_TABLET | Freq: Two times a day (BID) | ORAL | 2 refills | Status: DC

## 2016-06-27 MED ORDER — PANTOPRAZOLE SODIUM 20 MG PO TBEC: 20.0000 mg | DELAYED_RELEASE_TABLET | Freq: Two times a day (BID) | ORAL | 2 refills | Status: DC

## 2016-06-27 NOTE — Progress Notes (Signed)
Pt d/c to home by car with family. Assessment stable. All questions answered. 

## 2016-06-27 NOTE — Care Management Note (Signed)
Case Management Note  Patient Details  Name: Shari Vincent MRN: [id number] Date of Birth: Jul 03, 1931  Subjective/Objective: Pt admitted on 06/25/16 with LUE numbness, weakness and facial droop.  PTA, pt independent, lives with spouse.                   Action/Plan: PT/OT recommending no OP follow up or DME.  Case manager will continue to follow for dc needs.    Expected Discharge Date:                  Expected Discharge Plan:  Home/Self Care  In-House Referral:     Discharge planning Services  CM Consult  Post Acute Care Choice:    Choice offered to:     DME Arranged:    DME Agency:     HH Arranged:    HH Agency:     Status of Service:  In process, will continue to follow  If discussed at Long Length of Stay Meetings, dates discussed:    Additional Comments:  Reinaldo Raddle, RN, BSN  Trauma/Neuro ICU Case Manager [phone]

## 2016-06-27 NOTE — Progress Notes (Signed)
Carotid Duplex completed.   Preliminary results.  Bilateral: No significant (1-39%) ICA stenosis. Antegrade vertebral flow.    Landry Mellow, RDMS, RVT 06/27/2016

## 2016-06-27 NOTE — Discharge Summary (Signed)
Stroke Discharge Summary  Patient ID: Shari Vincent   MRN: [id number]      DOB: [birth year]  Date of Admission: 06/25/2016 Date of Discharge: 06/27/2016  Attending Physician:  Rosalin Hawking, MD, Stroke MD Patient's PCP:  Pleas Koch, NP  DISCHARGE DIAGNOSIS:  Principal Problem:   Subarachnoid hemorrhage ([place])   Cerebral amyloid angiopathy (CODE)   Transient focal neurological episodes   superficial hemosiderosis Active Problems:   Hyperlipidemia   PMB (postmenopausal bleeding)   Pelvic mass in female   Family hx-stroke  BMI: Body mass index is 31.39 kg/m.  Past Medical History:  Diagnosis Date  . Acid reflux   . Arthritis    right knee  . Bulging lumbar disc   . Cancer ([place])    Basal Cell on right shoulder  . Colon polyps   . Diverticulitis   . High cholesterol   . Irritable bowel syndrome (IBS)   . Kidney stone   . Knee pain    right  . Sciatic pain   . Trigeminal neuralgia   . Trigeminal neuralgia of right side of face   . Trigeminal neuralgia syndrome   . Vertigo    Past Surgical History:  Procedure Laterality Date  . BREAST SURGERY     biopsy in 1990   . CATARACT EXTRACTION    . COLONOSCOPY  05/27/2008   LPF:[id number] rectal polyp,s/p bx/sigmoid diverticula. hyperplastic polyps. benign random bx  . ESOPHAGOGASTRODUODENOSCOPY  05/27/2008   HGD:JMEQAS/TMHDQ HH  . EYE SURGERY    . FOOT SURGERY     Left foot, metal plate  . HYSTEROSCOPY W/D&C N/A 09/03/2014   Procedure: DILATATION AND CURETTAGE /HYSTEROSCOPY;  Surgeon: Donnamae Jude, MD;  Location: [facility];  Service: Gynecology;  Laterality: N/A;  Requesting 09/03/14 @ 2:00p  . HYSTEROSCOPY W/D&C N/A 08/19/2015   Procedure: DILATATION AND CURETTAGE /HYSTEROSCOPY;  Surgeon: Donnamae Jude, MD;  Location: [facility];  Service: Gynecology;  Laterality: N/A;  . LITHOTRIPSY    . OTHER SURGICAL HISTORY     Uterine Prolapse/Tacking  . PROLAPSED UTERINE FIBROID LIGATION     in 1960    Allergies as of 06/27/2016       Reactions   Albuterol Rash   Cefdinir Diarrhea   Levofloxacin Nausea Only   Epinephrine Anxiety, Other (See Comments)   Heart palpitations   Latex Rash      Medication List    TAKE these medications   acetaminophen 325 MG tablet Commonly known as:  TYLENOL Take 325 mg by mouth every 6 (six) hours as needed for headache (pain).   BLUE-EMU SUPER STRENGTH Crea Apply 1 application topically 3 (three) times daily as needed (pain).   calcium citrate-vitamin D 315-200 MG-UNIT tablet Commonly known as:  CITRACAL+D Take 1 tablet by mouth daily with lunch. Reported on 08/09/2015   D3 MAXIMUM STRENGTH 5000 units capsule Generic drug:  Cholecalciferol Take 5,000 Units by mouth daily with lunch.   DIGESTIVE ADVANTAGE GUMMIES Chew Chew 1 tablet by mouth daily.   gabapentin 100 MG capsule Commonly known as:  NEURONTIN Take 1 capsule (100 mg total) by mouth at bedtime.   hyoscyamine 0.125 MG tablet Commonly known as:  LEVSIN, ANASPAZ Take 0.125 mg by mouth every 4 (four) hours as needed (ibs/stomach pain).   levETIRAcetam 500 MG tablet Commonly known as:  KEPPRA Take 1 tablet (500 mg total) by mouth 2 (two) times daily.   Magnesium 250 MG Tabs Take 250 mg by  mouth daily.   meclizine 25 MG tablet Commonly known as:  ANTIVERT Take 25 mg by mouth 3 (three) times daily as needed for dizziness (vertigo). Reported on 05/13/2015   OXcarbazepine 150 MG tablet Commonly known as:  TRILEPTAL Take 150 mg by mouth at bedtime.   pantoprazole 20 MG tablet Commonly known as:  PROTONIX Take 1 tablet (20 mg total) by mouth 2 (two) times daily.   SYSTANE OP Place 1 drop into both eyes 2 (two) times daily.       LABORATORY STUDIES CBC    Component Value Date/Time   WBC 6.4 06/25/2016 0820   RBC 4.55 06/25/2016 0820   HGB 13.4 06/25/2016 0820   HCT 40.4 06/25/2016 0820   PLT 170 06/25/2016 0820   MCV 88.8 06/25/2016 0820   MCH 29.5 06/25/2016 0820   MCHC 33.2 06/25/2016 0820    RDW 13.2 06/25/2016 0820   LYMPHSABS 2.8 06/17/2014 1352   MONOABS 0.7 06/17/2014 1352   EOSABS 0.1 06/17/2014 1352   BASOSABS 0.0 06/17/2014 1352   CMP    Component Value Date/Time   NA 139 06/25/2016 0820   K 4.0 06/25/2016 0820   CL 106 06/25/2016 0820   CO2 24 06/25/2016 0820   GLUCOSE 105 (H) 06/25/2016 0820   BUN 9 06/25/2016 0820   CREATININE 0.84 06/25/2016 0820   CALCIUM 9.2 06/25/2016 0820   PROT 7.2 06/25/2016 0820   ALBUMIN 3.9 06/25/2016 0820   AST 22 06/25/2016 0820   ALT 12 (L) 06/25/2016 0820   ALKPHOS 55 06/25/2016 0820   BILITOT 0.7 06/25/2016 0820   GFRNONAA >60 06/25/2016 0820   GFRAA >60 06/25/2016 0820   COAGS Lab Results  Component Value Date   INR 1.03 06/25/2016   Lipid Panel    Component Value Date/Time   CHOL 191 06/25/2016 0820   TRIG 193 (H) 06/25/2016 0820   HDL 37 (L) 06/25/2016 0820   CHOLHDL 5.2 06/25/2016 0820   VLDL 39 06/25/2016 0820   LDLCALC 115 (H) 06/25/2016 0820   HgbA1C  Lab Results  Component Value Date   HGBA1C 5.9 (H) 06/25/2016   Urinalysis    Component Value Date/Time   COLORURINE STRAW (A) 06/25/2016 1200   APPEARANCEUR CLEAR 06/25/2016 1200   LABSPEC 1.009 06/25/2016 1200   PHURINE 7.0 06/25/2016 1200   GLUCOSEU NEGATIVE 06/25/2016 1200   HGBUR NEGATIVE 06/25/2016 1200   BILIRUBINUR NEGATIVE 06/25/2016 1200   BILIRUBINUR negative 04/26/2015 1129   KETONESUR NEGATIVE 06/25/2016 1200   PROTEINUR NEGATIVE 06/25/2016 1200   UROBILINOGEN negative 04/26/2015 1129   NITRITE NEGATIVE 06/25/2016 1200   LEUKOCYTESUR NEGATIVE 06/25/2016 1200   Urine Drug Screen     Component Value Date/Time   LABOPIA NONE DETECTED 06/25/2016 1200   COCAINSCRNUR NONE DETECTED 06/25/2016 1200   LABBENZ NONE DETECTED 06/25/2016 1200   AMPHETMU NONE DETECTED 06/25/2016 1200   THCU NONE DETECTED 06/25/2016 1200   LABBARB NONE DETECTED 06/25/2016 1200    Alcohol Level No results found for: [facility]   SIGNIFICANT DIAGNOSTIC  STUDIES I have personally reviewed the radiological images below and agree with the radiology interpretations.  CT head (no images available for review)  was obtained and showed a small amount of subarachnoid hemorrhage involving the right frontal convexity.   CTA of the head (no images available for review) was performed and showed no obvious aneurysm. She was transferred to [facility] for further management.   Mr Brain 1 Contrast Mr Angiogram Head Wo Contrast 06/25/2016  1. Trace subarachnoid hemorrhage along the right superior frontal convexity seen in two adjacent sulci. No associated acute infarct or other acute signal abnormality in the region. 2. 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 No other chronic cerebral blood products or superficial siderosis identified. 3. Intracranial MRA appears stable since 2012 with no intracranial stenosis or aneurysm identified. The entire area of subarachnoid hemorrhage was not covered by MRA but no cerebral AVM is suspected. 4. Chronically advanced and mildly progressed nonspecific cerebral white matter signal changes since 2012. There are also two small areas of cortical encephalomalacia in the right inferior frontal and right posterior parietal lobe which are new since 2012. Several tiny chronic cerebellar lacunar infarcts are also new.   2D Echo  - Left ventricle: The cavity size was normal. Wall thickness was normal. Systolic function was normal. The estimated ejection fraction was in the range of 60% to 65%. Wall motion was normal; there were no regional wall motion abnormalities. Features are consistent with aseudonormal left ventricular filling pattern, with concomitant abnormal relaxation and increased filling pressure (grade 2 diastolic dysfunction). - Mitral valve: Mildly calcified annulus. There was mild regurgitation.  Carotid Doppler    There is 1-39% bilateral ICA stenosis. Vertebral artery flow is antegrade.      EEG abnormal EEG demonstrating a very mild diffuse slowing of electrocerebral activity.  This can be seen in a wide variety of encephalopathic state including those of a toxic, metabolic, or degenerative nature.  There were no focal, hemispheric, or lateralizing features.  No epileptiform activity was recorded.   HISTORY OF PRESENT ILLNESS This is an 89-yo RH woman who is transferred from an OSH for evaluation of a right-sided subarachnoid hemorrhage. History is obtained directly from the patient who is an excellent historian. Her daughter is present at the bedside and offers additional information as needed.   The patient reports that she has been having episodes of left-sided numbness and weakness for the past two weeks. Initially these were isolated to the left hand where she would experience numbness and tingling with some difficulty moving the arm. She states that during the episodes her arm would just fall down. Her daughter only learned of these episodes over the past week. The patient had an episode of left arm symptoms on 4/30. On 5/3, she had two episodes, one of which included tingling of the L face for the first time. Last night she had numbness and tingling of the left arm and face but for the first time also had some numbness along the outside of her left leg. Also new was a mild left facial droop. All episodes have lasted for a few minutes, no longer than 15 minutes, with complete resolution between episodes. She denies any vision loss, double vision, difficulty swallowing, vertigo, dyscoordination, gait disturbance, or right-sided deficits. She has not had headache associated with the episodes, but this morning endorses a mild bifrontal headache.   She was seen by her PCP on 5/4 and was started on aspirin 81 mg daily. She took two doses of aspirin yesterday, one in the morning with a second dose given by  her daughter-in-law during last night's symptoms.   With last night's episode, family called 54 and was taken to [facility]. I have reviewed the records that accompanied the patient from [facility]. According to the RN intake note, she reported numbness of the left  face, arm, and leg that started at about 2200 last night. Symptoms reportedly worsened during EMS transport and she was also noted to have slurred speech. Symptoms resolved by the time she arrived in the ED and have not returned. She was noted to have a normal glucose by EMS. She complained of L-sided weakness and L facial droop but no deficits were present on her arrival to the ED. NIHSS score was 0. She passed a bedside swallow screen. SBP ranged from 152-181, DBP 66-86.CTH was obtained and showed a small amount of subarachnoid hemorrhage involving the right frontal convexity. CTA of the head was performed and showed no obvious aneurysm. She was transferred to [facility] for further management.   Last known well:  06/24/2016 at 2200 NHISS score: 1 mRS score: 0  Patient was not administered IV t-PA secondary to [facility]. She was admitted to the neuro ICU for further evaluation and treatment.    HOSPITAL COURSE Ms. Shari Vincent is a 83 y.o. female with history of HLD, R trigeminal neuroalgia, GERD, diverticulitis presenting to [facility] with L sided numbness, LUE weakness, L facial droop. CT showed a SAH. She was transferred to [facility] neuro ICU for further care  Small R frontal SAH with left superficial hemosiderosis likely secondary to cerebral amyloid angiopathy.    Resultant  Focal transient neurological episodes  MRI  Trace R frontal SAH. Prior L frontal convexity siderosis. New cortical encephalomalacia since 2012 R fin frontal and R post parietal. Old Tiny cerebellar lacunes.  MRA  Unremarkable   Carotid Doppler  B ICA 1-39% stenosis, VAs antegrade   2D Echo  EF 60-65%. No source of embolus    EEG very mild diffuse slowing  LDL 115  HgbA1c 5.9  aspirin 81 mg x 1 day prior to admission, now on No antithrombotic given SAH. Due to likely cerebral amyloid angiopathy (CAA) diagnosis, will avoid antiplatelet or anticoagulation in the future.  Ongoing aggressive stroke risk factor management  Therapy recommendations:  no therapy needs  Disposition:  return home  Focal transient neurological episodes (FTNEs)  Part of the presentation of cerebral amyloid angiopathy   On keppra 500mg  bid  Continue keppra on discharge  will increase dose if continued episodes  May also consider increase trileptal dose if continued episodes  Hyperlipidemia  Home meds:  No statin  LDL 115  No need to start stain given SAH and no statin at baseline  Other Stroke Risk Factors  Advanced age  Former Cigarette smoker  Obesity, Body mass index is 31.39 kg/m., recommend weight loss, diet and exercise as appropriate   Hx right eye pain and HA in 2012, MRI showed left frontal hemosiderosis, saw Dr. Merlene Laughter who dx trigeminal neuralgia at that time. Treated with very low dose trileptal until now. Pt has no symptoms since   Family hx stroke (father)  Other Active Problems  Ovarian fibroma causing postmenopausal bleeding.   DISCHARGE EXAM Blood pressure 129/71, pulse 65, temperature 97.7 F (36.5 C), temperature source Oral, resp. rate 20, height 5' (1.524 m), weight 72.9 kg (160 lb 11.5 oz), SpO2 97 %. General - Well nourished, well developed, in no apparent distress.  Ophthalmologic - Fundi not visualized due to small pupils.  Cardiovascular - Regular rate and rhythm.  Mental Status -  Level of arousal and orientation to time, place, and person were intact. Language including expression, naming, repetition, comprehension was assessed and found intact. Fund of Knowledge was assessed and was intact.  Cranial Nerves  II - XII - II - Visual field intact OU. III, IV, VI -  Extraocular movements intact. V - Facial sensation intact bilaterally. VII - Facial movement intact bilaterally. VIII - Hearing & vestibular intact bilaterally. X - Palate elevates symmetrically. XI - Chin turning & shoulder shrug intact bilaterally. XII - Tongue protrusion intact.  Motor Strength - The patient's strength was normal in all extremities and pronator drift was absent.  Bulk was normal and fasciculations were absent.   Motor Tone - Muscle tone was assessed at the neck and appendages and was normal.  Reflexes - The patient's reflexes were 1+ in all extremities and she had no pathological reflexes.  Sensory - Light touch, temperature/pinprick were assessed and were symmetrical.    Coordination - The patient had normal movements in the hands with no ataxia or dysmetria.  Tremor was absent.  Gait and Station - deferred.  Discharge Diet   Diet regular Room service appropriate? Yes; Fluid consistency: Thin liquids  DISCHARGE PLAN  Disposition:  Return home  No antithrombotic now or long-term d/t likely cerebral amyloid angiopathy diagnosis  Ongoing risk factor control by Primary Care Physician at time of discharge  Follow-up Pleas Koch, NP in 2 weeks.  Follow-up with Dr. Rosalin Hawking, Stroke Clinic in 6-8 weeks, office to schedule an appointment.  45 minutes were spent preparing discharge.  Rosalin Hawking, MD PhD Stroke Neurology 06/27/2016 4:56 PM

## 2016-06-27 NOTE — Progress Notes (Signed)
  Echocardiogram 2D Echocardiogram has been performed.  [name] M 06/27/2016, 11:44 AM

## 2016-06-27 NOTE — Progress Notes (Signed)
Physical Therapy Treatment Patient Details Name: Shari Vincent MRN: [id number] DOB: 11/30/1931 Today's Date: 06/27/2016    History of Present Illness Patient is a 83 y/o female who presents to [facility] with LUE numbness, weakness and facial droop. Head CT-right SAH in right frontal convexity. There appears to be relative prominence of vessels surrounding the area of hemorrhage, cannot exclude small vascular malformation.     PT Comments    Pt ambulated ~248ft with supervision, however required hand held assistance with last 147ft due to fatigue and min unsteadiness. Pt completed 4 stairs with min Shari Vincent using R hand rail and step to pattern for  Ascent and sideways for descent. Pt's family present today, and pt reports she will have a lot of support at home upon d/c. Continue current plan of care.   Follow Up Recommendations  No PT follow up;Supervision for mobility/OOB     Equipment Recommendations  None recommended by PT    Recommendations for Other Services       Precautions / Restrictions Precautions Precautions: Fall Restrictions Weight Bearing Restrictions: No    Mobility  Bed Mobility Overal bed mobility: Needs Assistance Bed Mobility: Supine to Sit     Supine to sit: Modified independent (Device/Increase time) (Requires min A for scooting in hospital bed. )     General bed mobility comments: Pt able to complete supine <> sit with use of bed rails.   Transfers Overall transfer level: Needs assistance Equipment used: None Transfers: Sit to/from Stand Sit to Stand: Supervision         General transfer comment: Slow to stand from low commode due to weakness but no assist needed.  Ambulation/Gait Ambulation/Gait assistance: Min assist Ambulation Distance (Feet): 200 Feet Assistive device: 1 person hand held assist Gait Pattern/deviations: Step-through pattern;Decreased stride length Gait velocity: decreased  Gait velocity interpretation: Below normal speed for  age/gender General Gait Details: Gait 125ft with supervision. Pt required hand held assist walking back to room due to fatigue.    Stairs Stairs: Yes   Stair Management: One rail Right;Step to pattern;Sideways Number of Stairs: 4 General stair comments: Pt completed 4 stairs using R rail and step to pattern with ascent and sideways with BUE support with descent.   Wheelchair Mobility    Modified Rankin (Stroke Patients Only) Modified Rankin (Stroke Patients Only) Pre-Morbid Rankin Score: Slight disability Modified Rankin: Moderately severe disability     Balance Overall balance assessment: Needs assistance Sitting-balance support: Feet supported Sitting balance-Leahy Scale: Good     Standing balance support: No upper extremity supported;During functional activity Standing balance-Leahy Scale: Fair Standing balance comment: Pt has good static standing balance. Requires UE support during gait due to fatigue                            Cognition Arousal/Alertness: Awake/alert Behavior During Therapy: WFL for tasks assessed/performed Overall Cognitive Status: Within Functional Limits for tasks assessed                                        Exercises      General Comments        Pertinent Vitals/Pain Pain Assessment: No/denies pain    Home Living                      Prior Function  PT Goals (current goals can now be found in the care plan section) Acute Rehab PT Goals Patient Stated Goal: to go home PT Goal Formulation: With patient/family Time For Goal Achievement: 07/10/16 Potential to Achieve Goals: Good    Frequency    Min 4X/week      PT Plan Current plan remains appropriate    Co-evaluation              AM-PAC PT "6 Clicks" Daily Activity  Outcome Measure  Difficulty turning over in bed (including adjusting bedclothes, sheets and blankets)?: None Difficulty moving from lying on back to  sitting on the side of the bed? : None Difficulty sitting down on and standing up from a chair with arms (e.g., wheelchair, bedside commode, etc,.)?: A Little Help needed moving to and from a bed to chair (including a wheelchair)?: A Little Help needed walking in hospital room?: A Little Help needed climbing 3-5 steps with a railing? : A Little 6 Click Score: 20    End of Session Equipment Utilized During Treatment: Gait belt Activity Tolerance: Patient tolerated treatment well Patient left: in bed;with bed alarm set;with family/visitor present Nurse Communication: Mobility status PT Visit Diagnosis: Unsteadiness on feet (R26.81);Muscle weakness (generalized) (M62.81)     Time: 5053-9767 PT Time Calculation (min) (ACUTE ONLY): 23 min  Charges:  $Gait Training: 8-22 mins $Therapeutic Activity: 8-22 mins                    G Codes:       Loma Sousa, SPT  [phone]   Loma Sousa 06/27/2016, 9:36 AM

## 2016-06-28 ENCOUNTER — Telehealth: Payer: Self-pay | Admitting: *Deleted

## 2016-06-28 LAB — VAS US CAROTID
LEFT ECA DIAS: -11 cm/s
LEFT VERTEBRAL DIAS: 14 cm/s
Left CCA dist dias: -11 cm/s
Left CCA dist sys: -68 cm/s
Left CCA prox dias: 14 cm/s
Left CCA prox sys: 97 cm/s
Left ICA dist dias: -24 cm/s
Left ICA dist sys: -93 cm/s
Left ICA prox dias: -11 cm/s
Left ICA prox sys: -54 cm/s
RIGHT ECA DIAS: -11 cm/s
RIGHT VERTEBRAL DIAS: 11 cm/s
Right CCA prox dias: 11 cm/s
Right CCA prox sys: 82 cm/s
Right cca dist sys: -59 cm/s

## 2016-06-28 NOTE — Telephone Encounter (Signed)
Patient ID:      Shari Vincent                         MRN: [id number]                                                         DOB: 01/22/1932  Date of Admission: 06/25/2016 Date of Discharge: 06/27/2016  Attending Physician:  Rosalin Hawking, MD, Stroke MD Patient's PCP:  Pleas Koch, NP  DISCHARGE DIAGNOSIS:  Principal Problem:   Subarachnoid hemorrhage ([place])   Cerebral amyloid angiopathy ([place])   Transient focal neurological episodes   superficial hemosiderosis Active Problems:   Hyperlipidemia   PMB (postmenopausal bleeding)   Pelvic mass in female   Family hx-stroke  Transition Care Management Follow-up Telephone Call   How have you been since you were released from the hospital? Shari Vincent. I've been sleeping a lot.   Do you understand why you were in the hospital? yes   Do you understand the discharge instructions? yes   Where were you discharged to? Home with husband and children are staying close.   Items Reviewed:  Medications reviewed: yes  Allergies reviewed: yes  Dietary changes reviewed: yes  Referrals reviewed: yes   Functional Questionnaire:   Activities of Daily Living (ADLs):   She states they are independent in the following: ambulation, bathing and hygiene, feeding, continence, grooming, toileting and dressing States they require assistance with the following: none   Any transportation issues/concerns?: no   Any patient concerns? no   Confirmed importance and date/time of follow-up visits scheduled yes  Pt states she will have her daughter call back this afternoon. Pt instructed that appt needs to be 79min and within the next 7-13day. Pt verbalized understanding.  Confirmed with patient if condition begins to worsen call PCP or go to the ER.  Patient was given the office number and encouraged to call back with question or concerns.  : yes

## 2016-06-28 NOTE — Care Management Note (Signed)
Case Management Note  Patient Details  Name: CHANETTA MOOSMAN MRN: [id number] Date of Birth: June 08, 1931  Subjective/Objective:                    Action/Plan: 06/28/2016 at 0823: Patient d/ced late yesterday afternoon. No f/u per PT/OT and no DME needs. Pt has insurance and PCP. No further needs per CM.   Expected Discharge Date:  06/27/16               Expected Discharge Plan:  Home/Self Care  In-House Referral:     Discharge planning Services  CM Consult  Post Acute Care Choice:    Choice offered to:     DME Arranged:    DME Agency:     HH Arranged:    HH Agency:     Status of Service:  Completed, signed off  If discussed at [employer] of Stay Meetings, dates discussed:    Additional Comments:  Pollie Friar, RN 06/28/2016, 8:22 AM

## 2016-06-29 ENCOUNTER — Encounter: Payer: Self-pay | Admitting: Primary Care

## 2016-06-29 ENCOUNTER — Ambulatory Visit (INDEPENDENT_AMBULATORY_CARE_PROVIDER_SITE_OTHER): Payer: Medicare Other | Admitting: Primary Care

## 2016-06-29 DIAGNOSIS — I680 Cerebral amyloid angiopathy: Secondary | ICD-10-CM

## 2016-06-29 DIAGNOSIS — I609 Nontraumatic subarachnoid hemorrhage, unspecified: Secondary | ICD-10-CM | POA: Diagnosis not present

## 2016-06-29 NOTE — Patient Instructions (Addendum)
Work on improvements in your diet by limiting sweets, fatty foods, fried foods. Increase vegetables, fruit, whole grains, water.  Follow up with the neurologist as scheduled.  Follow up in 3 months for re-evaluation.  Please message/call me if you need anything! It was a pleasure to see you today!

## 2016-06-29 NOTE — Assessment & Plan Note (Signed)
Avoid all blood thinners. Hold off on statin for now. Follow up with neurology as scheduled. Neuro exam today stable.

## 2016-06-29 NOTE — Progress Notes (Signed)
Pre visit review using our clinic review tool, if applicable. No additional management support is needed unless otherwise documented below in the visit note. 

## 2016-06-29 NOTE — Progress Notes (Signed)
Subjective:    Patient ID: Shari Vincent, female    DOB: [birth year], 83 y.o.   MRN: [id number]  HPI  Ms. Shari Vincent is an 83 year old female who presents today for TCM hospital follow up.  She presented to our office on 06/23/16 with a two week history of left upper extremity weakness and a 24 hour history of left sided facial numbness. We started the process for stroke work up and gave strict emergency department precautions if symptoms progressed.  She presented to the [facility] with headache, increased weakness, increased numbness. She was found to have right sub-arachnoid hemorrhage She was then transferred to [facility] Neuro ICU for admission and further evaluation.   She underwent CT head and CTA of the head in [place] without evidence of aneurysm. During her stay at [facility] she underwent MR Brain (trace subarachnoid hemorrhage, no acute infarct, several tiny chronic cerebellar lacunar infarcts). She underwent EEG (mild diffuse slowing of electrocerebral activity; Caroid dopplers (1-39% stenosis bilaterally), 2D echo (EF 75-17%, normal systolic function, grade 2 diastolic dysfunction). Her SAH with left superficial hemosiderosis was determined to likely secondary to cerebral amyloid angiopathy. She was initiated on Keppra 500 mg during her stay for seizure prevention. She was discharged home on 06/27/16 with recommendations for PCP and Neurology follow up.  Since her discharge home her left upper extremity strength has improved, she's had a mild bilateral frontal headache. She's felt groggy since initiaton of Keppra. She denies increased weakness, facial droop, increased numbness, slurred speech, chest pain. She has an appointment scheduled with her neurologist in 2 weeks. She's still taking Keppra as prescribed.   Review of Systems  Constitutional:       Drowsy from [place]  Respiratory: Negative for shortness of breath.   Cardiovascular: Negative for chest pain.    Neurological: Positive for headaches. Negative for dizziness, weakness and numbness.       Past Medical History:  Diagnosis Date  . Acid reflux   . Arthritis    right knee  . Bulging lumbar disc   . Cancer ([place])    Basal Cell on right shoulder  . Colon polyps   . Diverticulitis   . High cholesterol   . Irritable bowel syndrome (IBS)   . Kidney stone   . Knee pain    right  . Sciatic pain   . Trigeminal neuralgia   . Trigeminal neuralgia of right side of face   . Trigeminal neuralgia syndrome   . Vertigo      Social History   Social History  . Marital status: Married    Spouse name: Shari Vincent  . Number of children: Shari Vincent  . Years of education: Shari Vincent   Occupational History  . Not on file.   Social History Main Topics  . Smoking status: Former Smoker    Types: Cigarettes  . Smokeless tobacco: Never Used  . Alcohol use No  . Drug use: No  . Sexual activity: Not Currently    Birth control/ protection: Post-menopausal   Other Topics Concern  . Not on file   Social History Narrative   Married 62 years.   Has three children, three grandchildren, one great grandchildren.   Enjoys going to church, baking, shop.    Past Surgical History:  Procedure Laterality Date  . BREAST SURGERY     biopsy in 1990   . CATARACT EXTRACTION    . COLONOSCOPY  05/27/2008   GYF:[id number] rectal polyp,s/p bx/sigmoid diverticula. hyperplastic polyps.  benign random bx  . ESOPHAGOGASTRODUODENOSCOPY  05/27/2008   PYP:[id number]/[id number] HH  . EYE SURGERY    . FOOT SURGERY     Left foot, metal plate  . HYSTEROSCOPY W/D&C Shari Vincent 09/03/2014   Procedure: DILATATION AND CURETTAGE /HYSTEROSCOPY;  Surgeon: Donnamae Jude, MD;  Location: [facility];  Service: Gynecology;  Laterality: Shari Vincent;  Requesting 09/03/14 @ 2:00p  . HYSTEROSCOPY W/D&C Shari Vincent 08/19/2015   Procedure: DILATATION AND CURETTAGE /HYSTEROSCOPY;  Surgeon: Donnamae Jude, MD;  Location: [facility];  Service: Gynecology;  Laterality: Shari Vincent;  . LITHOTRIPSY    .  OTHER SURGICAL HISTORY     Uterine Prolapse/Tacking  . PROLAPSED UTERINE FIBROID LIGATION     in 1960    Family History  Problem Relation Age of Onset  . Diabetes Mother   . Heart disease Mother        MI  . Uterine cancer Mother   . Stroke Father   . Cancer Paternal Grandmother        intestinal  . Stomach cancer Maternal Grandmother   . Colon cancer Neg Hx     Allergies  Allergen Reactions  . Albuterol Rash  . Cefdinir Diarrhea  . Levofloxacin Nausea Only  . Epinephrine Anxiety and Other (See Comments)    Heart palpitations  . Latex Rash    Current Outpatient Prescriptions on File Prior to Visit  Medication Sig Dispense Refill  . acetaminophen (TYLENOL) 325 MG tablet Take 325 mg by mouth every 6 (six) hours as needed for headache (pain).    . calcium citrate-vitamin D (CITRACAL+D) 315-200 MG-UNIT tablet Take 1 tablet by mouth daily with lunch. Reported on 08/09/2015    . Cholecalciferol (D3 MAXIMUM STRENGTH) 5000 units capsule Take 5,000 Units by mouth daily with lunch.     . gabapentin (NEURONTIN) 100 MG capsule Take 1 capsule (100 mg total) by mouth at bedtime. 90 capsule 1  . hyoscyamine (LEVSIN, ANASPAZ) 0.125 MG tablet Take 0.125 mg by mouth every 4 (four) hours as needed (ibs/stomach pain).     Marland Kitchen levETIRAcetam (KEPPRA) 500 MG tablet Take 1 tablet (500 mg total) by mouth 2 (two) times daily. 60 tablet 2  . Liniments (BLUE-EMU SUPER STRENGTH) CREA Apply 1 application topically 3 (three) times daily as needed (pain).     . Magnesium 250 MG TABS Take 250 mg by mouth daily.    . meclizine (ANTIVERT) 25 MG tablet Take 25 mg by mouth 3 (three) times daily as needed for dizziness (vertigo). Reported on 05/13/2015    . OXcarbazepine (TRILEPTAL) 150 MG tablet Take 150 mg by mouth at bedtime.     . pantoprazole (PROTONIX) 20 MG tablet Take 1 tablet (20 mg total) by mouth 2 (two) times daily. 60 tablet 2  . Polyethyl Glycol-Propyl Glycol (SYSTANE OP) Place 1 drop into both eyes 2  (two) times daily.    . Probiotic Product (DIGESTIVE ADVANTAGE GUMMIES) CHEW Chew 1 tablet by mouth daily.     No current facility-administered medications on file prior to visit.     BP 122/62   Pulse 64   Temp 97.7 F (36.5 C) (Oral)   Wt 165 lb 4 oz (75 kg)   SpO2 95%   BMI 32.27 kg/m    Objective:   Physical Exam  Constitutional: She is oriented to person, place, and time. She appears well-nourished.  Eyes: EOM are normal. Pupils are equal, round, and reactive to light.  Neck: Neck supple.  Cardiovascular: Normal rate and regular  rhythm.   Pulmonary/Chest: Effort normal and breath sounds normal.  Neurological: She is alert and oriented to person, place, and time. No cranial nerve deficit. Coordination normal.  Left grip slightly weaker than right. Upper extremity strength equal bilaterally. No facial drooping or arm drift.  Skin: Skin is warm and dry.          Assessment & Plan:  Hospital Follow Up:  Admitted to [facility] Neuro ICU on 05/06 for [facility]. SAH determined to be secondary to cerebral amyloid angiopathy. Overall doing better, drowsiness with Keppra. Discussed to continue Keppra as prescribed. She will see the neurologist in 1-2 weeks for follow up as recommended. Discussed no blood thinners given bleed. Neuro exam stable today.   All hospital notes, labs images reviewed. Sheral Flow, NP

## 2016-06-29 NOTE — Assessment & Plan Note (Signed)
Will be seeing neurology within 2 weeks. Neuro exam unremarkable. Continue Keppra.

## 2016-06-29 NOTE — Telephone Encounter (Signed)
Appt scheduled 06/29/16 @1015  for TCM hospital f/u.

## 2016-07-01 ENCOUNTER — Emergency Department (HOSPITAL_COMMUNITY): Payer: Medicare Other

## 2016-07-01 ENCOUNTER — Emergency Department (HOSPITAL_COMMUNITY)
Admission: EM | Admit: 2016-07-01 | Discharge: 2016-07-01 | Disposition: A | Discharge status: Home / Self Care | Payer: Medicare Other | Attending: Emergency Medicine | Admitting: Emergency Medicine

## 2016-07-01 DIAGNOSIS — R202 Paresthesia of skin: Secondary | ICD-10-CM

## 2016-07-01 DIAGNOSIS — Z87891 Personal history of nicotine dependence: Secondary | ICD-10-CM | POA: Insufficient documentation

## 2016-07-01 DIAGNOSIS — Z85828 Personal history of other malignant neoplasm of skin: Secondary | ICD-10-CM | POA: Diagnosis not present

## 2016-07-01 DIAGNOSIS — Z79899 Other long term (current) drug therapy: Secondary | ICD-10-CM | POA: Diagnosis not present

## 2016-07-01 DIAGNOSIS — Z91040 Latex allergy status: Secondary | ICD-10-CM | POA: Diagnosis not present

## 2016-07-01 DIAGNOSIS — Z5181 Encounter for therapeutic drug level monitoring: Secondary | ICD-10-CM | POA: Insufficient documentation

## 2016-07-01 DIAGNOSIS — R200 Anesthesia of skin: Secondary | ICD-10-CM | POA: Diagnosis present

## 2016-07-01 LAB — I-STAT CHEM 8, ED
BUN: 15 mg/dL (ref 6–20)
Calcium, Ion: 1.13 mmol/L — ABNORMAL LOW (ref 1.15–1.40)
Chloride: 107 mmol/L (ref 101–111)
Creatinine, Ser: 0.9 mg/dL (ref 0.44–1.00)
Glucose, Bld: 84 mg/dL (ref 65–99)
HCT: 44 % (ref 36.0–46.0)
Hemoglobin: 15 g/dL (ref 12.0–15.0)
Potassium: 3.8 mmol/L (ref 3.5–5.1)
Sodium: 140 mmol/L (ref 135–145)
TCO2: 24 mmol/L (ref 0–100)

## 2016-07-01 LAB — COMPREHENSIVE METABOLIC PANEL
ALT: 12 U/L — ABNORMAL LOW (ref 14–54)
AST: 22 U/L (ref 15–41)
Albumin: 4 g/dL (ref 3.5–5.0)
Alkaline Phosphatase: 58 U/L (ref 38–126)
Anion gap: 7 (ref 5–15)
BUN: 13 mg/dL (ref 6–20)
CO2: 23 mmol/L (ref 22–32)
Calcium: 9.3 mg/dL (ref 8.9–10.3)
Chloride: 106 mmol/L (ref 101–111)
Creatinine, Ser: 0.85 mg/dL (ref 0.44–1.00)
GFR calc Af Amer: >60 mL/min (ref >60)
GFR calc non Af Amer: >60 mL/min (ref >60)
Glucose, Bld: 85 mg/dL (ref 65–99)
Potassium: 3.9 mmol/L (ref 3.5–5.1)
Sodium: 136 mmol/L (ref 135–145)
Total Bilirubin: 0.7 mg/dL (ref 0.3–1.2)
Total Protein: 7.1 g/dL (ref 6.5–8.1)

## 2016-07-01 LAB — APTT: aPTT: 30 seconds (ref 24–36)

## 2016-07-01 LAB — DIFFERENTIAL
Basophils Absolute: 0 10*3/uL (ref 0.0–0.1)
Basophils Relative: 0 %
Eosinophils Absolute: 0.1 10*3/uL (ref 0.0–0.7)
Eosinophils Relative: 2 %
Lymphocytes Relative: 49 %
Lymphs Abs: 3.7 10*3/uL (ref 0.7–4.0)
Monocytes Absolute: 0.6 10*3/uL (ref 0.1–1.0)
Monocytes Relative: 8 %
Neutro Abs: 3 10*3/uL (ref 1.7–7.7)
Neutrophils Relative %: 41 %

## 2016-07-01 LAB — CBC
HCT: 41.3 % (ref 36.0–46.0)
Hemoglobin: 14.1 g/dL (ref 12.0–15.0)
MCH: 30.5 pg (ref 26.0–34.0)
MCHC: 34.1 g/dL (ref 30.0–36.0)
MCV: 89.2 fL (ref 78.0–100.0)
Platelets: 172 10*3/uL (ref 150–400)
RBC: 4.63 MIL/uL (ref 3.87–5.11)
RDW: 13 % (ref 11.5–15.5)
WBC: 7.4 10*3/uL (ref 4.0–10.5)

## 2016-07-01 LAB — CBG MONITORING, ED: Glucose-Capillary: 84 mg/dL (ref 65–99)

## 2016-07-01 LAB — PROTIME-INR
INR: 0.99
Prothrombin Time: 13.1 seconds (ref 11.4–15.2)

## 2016-07-01 LAB — I-STAT TROPONIN, ED: Troponin i, poc: 0 ng/mL (ref 0.00–0.08)

## 2016-07-01 MED ORDER — LACOSAMIDE 50 MG PO TABS: 50.0000 mg | ORAL_TABLET | Freq: Two times a day (BID) | ORAL | 1 refills | Status: DC

## 2016-07-01 NOTE — ED Triage Notes (Signed)
ALL notes entered by Pablo Ledger. R.N. Are entered in error.

## 2016-07-01 NOTE — ED Notes (Signed)
Pt CBG was 84, notified Audrey(RN)

## 2016-07-01 NOTE — ED Provider Notes (Signed)
[facility] DEPT Provider Note   CSN: [id number] Arrival date & time: 07/01/16  1247     History   Chief Complaint No chief complaint on file.   HPI Shari Vincent is a 83 y.o. female.  HPI Patient presents to the emergency room with complaints of left hand numbness. Patient was recently admitted to the hospital on May 6. She was discharged home with a diagnosis of subarachnoid hemorrhage and cerebral amyloid angiopathy.  The patient was admitted on May 6.  She was discharged on May 8. Patient was at home today when she started complaining of tingling and numbness in her left hand. Family members were home with her and they called EMS to bring her to the emergency room. Patient complains of mild persistent headache. She denies any speech issues. No other neurologic complaints. Code stroke was activated by EMS. Past Medical History:  Diagnosis Date  . Acid reflux   . Arthritis    right knee  . Bulging lumbar disc   . Cancer ([place])    Basal Cell on right shoulder  . Colon polyps   . Diverticulitis   . High cholesterol   . Irritable bowel syndrome (IBS)   . Kidney stone   . Knee pain    right  . Sciatic pain   . Trigeminal neuralgia   . Trigeminal neuralgia of right side of face   . Trigeminal neuralgia syndrome   . Vertigo     Patient Active Problem List   Diagnosis Date Noted  . Acute cerebrovascul insuff, transient focal neurologic signs/symptoms 06/27/2016  . Family hx-stroke 06/27/2016  . Cerebral amyloid angiopathy (CODE) 06/26/2016  . 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 Subarachnoid hemorrhage ([place]) 06/25/2016  . Upper extremity weakness 06/23/2016  . Osteoporosis 10/22/2014  . Pelvic mass in female 08/25/2014  . PMB (postmenopausal bleeding) 08/06/2014  . Medicare annual wellness visit, subsequent 06/17/2014  . IBS (irritable bowel syndrome) 05/26/2014  . Right-sided low back pain with right-sided sciatica 05/26/2014  . Trigeminal neuralgia 05/26/2014  .  OA (osteoarthritis) of knee 07/23/2012  . Venous stasis 07/23/2012  . COLONIC POLYPS, HYPERPLASTIC 08/14/2008  . Hyperlipidemia 08/14/2008  . GERD 08/14/2008  . VERTIGO 08/14/2008    Past Surgical History:  Procedure Laterality Date  . BREAST SURGERY     biopsy in 1990   . CATARACT EXTRACTION    . COLONOSCOPY  05/27/2008   [id number] rectal polyp,s/p bx/sigmoid diverticula. hyperplastic polyps. benign random bx  . ESOPHAGOGASTRODUODENOSCOPY  05/27/2008   [id number]:[id number] HH  . EYE SURGERY    . FOOT SURGERY     Left foot, metal plate  . HYSTEROSCOPY W/D&C N/A 09/03/2014   Procedure: DILATATION AND CURETTAGE /HYSTEROSCOPY;  Surgeon: Donnamae Jude, MD;  Location: [facility];  Service: Gynecology;  Laterality: N/A;  Requesting 09/03/14 @ 2:00p  . HYSTEROSCOPY W/D&C N/A 08/19/2015   Procedure: DILATATION AND CURETTAGE /HYSTEROSCOPY;  Surgeon: Donnamae Jude, MD;  Location: [facility];  Service: Gynecology;  Laterality: N/A;  . LITHOTRIPSY    . OTHER SURGICAL HISTORY     Uterine Prolapse/Tacking  . PROLAPSED UTERINE FIBROID LIGATION     in 1960    OB History    Gravida Para Term Preterm AB Living   5 3   3 2 3    SAB TAB Ectopic Multiple Live Births   2               Home Medications    Prior to Admission medications  Medication Sig Start Date End Date Taking? Authorizing Provider  acetaminophen (TYLENOL) 325 MG tablet Take 325 mg by mouth every 6 (six) hours as needed for headache (pain).    [provider]  calcium citrate-vitamin D (CITRACAL+D) 315-200 MG-UNIT tablet Take 1 tablet by mouth daily with lunch. Reported on 08/09/2015    [provider]  Cholecalciferol (D3 MAXIMUM STRENGTH) 5000 units capsule Take 5,000 Units by mouth daily with lunch.     [provider]  gabapentin (NEURONTIN) 100 MG capsule Take 1 capsule (100 mg total) by mouth at bedtime. 07/07/15   Pleas Koch, NP  hyoscyamine (LEVSIN, ANASPAZ) 0.125 MG tablet Take 0.125 mg by  mouth every 4 (four) hours as needed (ibs/stomach pain).     [provider]  lacosamide (VIMPAT) 50 MG TABS tablet Take 1 tablet (50 mg total) by mouth 2 (two) times daily. 07/01/16   Dorie Rank, MD  Liniments (BLUE-EMU SUPER STRENGTH) CREA Apply 1 application topically 3 (three) times daily as needed (pain).     [provider]  Magnesium 250 MG TABS Take 250 mg by mouth daily.    [provider]  meclizine (ANTIVERT) 25 MG tablet Take 25 mg by mouth 3 (three) times daily as needed for dizziness (vertigo). Reported on 05/13/2015    [provider]  OXcarbazepine (TRILEPTAL) 150 MG tablet Take 150 mg by mouth at bedtime.     [provider]  pantoprazole (PROTONIX) 20 MG tablet Take 1 tablet (20 mg total) by mouth 2 (two) times daily. 06/27/16   Donzetta Starch, NP  Polyethyl Glycol-Propyl Glycol (SYSTANE OP) Place 1 drop into both eyes 2 (two) times daily.    [provider]  Probiotic Product (DIGESTIVE ADVANTAGE GUMMIES) CHEW Chew 1 tablet by mouth daily.    [provider]    Family History Family History  Problem Relation Age of Onset  . Diabetes Mother   . Heart disease Mother        MI  . Uterine cancer Mother   . Stroke Father   . Cancer Paternal Grandmother        intestinal  . Stomach cancer Maternal Grandmother   . Colon cancer Neg Hx     Social History Social History  Substance Use Topics  . Smoking status: Former Smoker    Types: Cigarettes  . Smokeless tobacco: Never Used  . Alcohol use No     Allergies   Albuterol; Cefdinir; Levofloxacin; Epinephrine; and Latex   Review of Systems Review of Systems  All other systems reviewed and are negative.    Physical Exam Updated Vital Signs BP 123/68   Pulse 70   Resp (!) 23   SpO2 97%   Physical Exam  Constitutional: She appears well-developed and well-nourished. No distress.  HENT:  Head: Normocephalic and atraumatic.  Right Ear: External ear  normal.  Left Ear: External ear normal.  Eyes: Conjunctivae are normal. Right eye exhibits no discharge. Left eye exhibits no discharge. No scleral icterus.  Neck: Neck supple. No tracheal deviation present.  Cardiovascular: Normal rate, regular rhythm and intact distal pulses.   Pulmonary/Chest: Effort normal and breath sounds normal. No stridor. No respiratory distress. She has no wheezes. She has no rales.  Abdominal: Soft. Bowel sounds are normal. She exhibits no distension. There is no tenderness. There is no rebound and no guarding.  Musculoskeletal: She exhibits no edema or tenderness.  Neurological: She is alert. She has normal strength. No  cranial nerve deficit (mild left facial droop, extraocular movements intact, no slurred speech ) or sensory deficit. She exhibits normal muscle tone. She displays no seizure activity. Coordination normal.  She stroke team documentation for complete neuro eval, NIHSS exam  Skin: Skin is warm and dry. No rash noted.  Psychiatric: She has a normal mood and affect.  Nursing note and vitals reviewed.    ED Treatments / Results  Labs (all labs ordered are listed, but only abnormal results are displayed) Labs Reviewed  COMPREHENSIVE METABOLIC PANEL - Abnormal; Notable for the following:       Result Value   ALT 12 (*)    All other components within normal limits  I-STAT CHEM 8, ED - Abnormal; Notable for the following:    Calcium, Ion 1.13 (*)    All other components within normal limits  PROTIME-INR  APTT  CBC  DIFFERENTIAL  I-STAT TROPOININ, ED  CBG MONITORING, ED    EKG  EKG Interpretation  Date/Time:  Saturday Jul 01 2016 13:21:59 EDT Ventricular Rate:  60 PR Interval:    QRS Duration: 146 QT Interval:  456 QTC Calculation: 456 R Axis:   32 Text Interpretation:  Sinus rhythm Borderline prolonged PR interval Consider left atrial enlargement Left bundle branch block No significant change since last tracing Confirmed by Artice Bergerson  MD-J,  Silas Sedam ([id number]) on 07/01/2016 1:53:11 PM       Radiology Ct Head Code Stroke W/o Cm  Addendum Date: 07/01/2016   ADDENDUM REPORT: 07/01/2016 13:36 ADDENDUM: CLINICAL DATA: Code stroke. Left-sided weakness. Recent subarachnoid hemorrhage on the right. EXAM: CT HEAD WITHOUT CONTRAST TECHNIQUE: Contiguous axial images were obtained from the base of the skull through the vertex without intravenous contrast. COMPARISON:  MRI 06/25/2016. FINDINGS: Brain: Moderate atrophy. Chronic white matter changes bilaterally consistent with microvascular ischemia. Negative for acute infarct or mass lesion. Mild Hyperintensity in a sulcus over the right convexity. This is compatible with hemorrhage which could be subacute or chronic. According to recent records the patient was admitted on 06/25/2016 for subarachnoid hemorrhage on the right. No shift of the midline structures. Vascular:  Negative for hyperdense vessel. Skull:  Negative Sinuses/Orbits:  Negative Other:  None ASPECTS ([place] Stroke Program Early CT Score) - Ganglionic level infarction (caudate, lentiform nuclei, internal capsule, insula, M1-M3 cortex):  7 - Supraganglionic infarction (M4-M6 cortex):  3 Total score (0-10 with 10 being normal):  10 IMPRESSION: 1. Hyperdensity in the right convexity sulcus compatible with small volume subarachnoid hemorrhage. This was present on the MRI of 06/25/2016. This may be subacute blood, however It is possible this is an area of iron deposition due to chronic hemorrhage. Follow-up CT scans will be helpful to determine if this area changes over time. 2. ASPECTS is 10 These results were called by telephone at the time of interpretation on 07/01/2016 at 1:30 pm to Dr. Theda Belfast , who verbally acknowledged these results. Electronically Signed   By: Franchot Gallo M.D.   On: 07/01/2016 13:36   Result Date: 07/01/2016 CLINICAL DATA:  Code stroke. EXAM: CT HEAD WITHOUT CONTRAST TECHNIQUE: Contiguous axial images were obtained from the  base of the skull through the vertex without intravenous contrast. COMPARISON:  None. FINDINGS: Brain: Vascular: Skull: Sinuses/Orbits: Other: ASPECTS ([place] Stroke Program Early CT Score) - Ganglionic level infarction (caudate, lentiform nuclei, internal capsule, insula, M1-M3 cortex): - Supraganglionic infarction (M4-M6 cortex): Total score (0-10 with 10 being normal): IMPRESSION: 1. 2. ASPECTS is Electronically Signed: By: Franchot Gallo M.D. On:  07/01/2016 13:15    Procedures Procedures (including critical care time)  Medications Ordered in ED Medications - No data to display   Initial Impression / Assessment and Plan / ED Course  I have reviewed the triage vital signs and the nursing notes.  Pertinent labs & imaging results that were available during my care of the patient were reviewed by me and considered in my medical decision making (see chart for details).   patient presented to the emergency room for evaluation of hand paresthesias. She was recently admitted to the hospital after a subarachnoid hemorrhage. Patient was evaluated by neurology. She is not showing any signs of acute stroke at this point. Patient is certainly not a candidate for TPA or any other acute intervention based on her recent cerebral hemorrhage.  It is possible her symptoms may be related to a partial seizure.  Dr. Patrcia Dolly recommended discontinuing Keppra and starting Vimpat 50 mg twice daily  Final Clinical Impressions(s) / ED Diagnoses   Final diagnoses:  Paresthesia    New Prescriptions New Prescriptions   LACOSAMIDE (VIMPAT) 50 MG TABS TABLET    Take 1 tablet (50 mg total) by mouth 2 (two) times daily.     Dorie Rank, MD 07/01/16 1534

## 2016-07-01 NOTE — Consult Note (Cosign Needed)
Reason for Consult: Code Stroke Referring Physician: ER  Shari Vincent is an 83 y.o. female.  HPI: Left face, arm and leg numbness and tingling.  She was recently admitted a couple of weeks ago with right frontal SAH secondary to amyloid angiopathy.  CT today shows minimal residual SAH in the right frontal lobe.  No baseline CT to compare to since prior done at OSH.  She is taking Keppra for possible SPS after discharge but it makes her very drowsy.   Past Medical History:  Diagnosis Date  . Acid reflux   . Arthritis    right knee  . Bulging lumbar disc   . Cancer ([place])    Basal Cell on right shoulder  . Colon polyps   . Diverticulitis   . High cholesterol   . Irritable bowel syndrome (IBS)   . Kidney stone   . Knee pain    right  . Sciatic pain   . Trigeminal neuralgia   . Trigeminal neuralgia of right side of face   . Trigeminal neuralgia syndrome   . Vertigo     Past Surgical History:  Procedure Laterality Date  . BREAST SURGERY     biopsy in 1990   . CATARACT EXTRACTION    . COLONOSCOPY  05/27/2008   HQP:[id number] rectal polyp,s/p bx/sigmoid diverticula. hyperplastic polyps. benign random bx  . ESOPHAGOGASTRODUODENOSCOPY  05/27/2008   LDJ:[id number] HH  . EYE SURGERY    . FOOT SURGERY     Left foot, metal plate  . HYSTEROSCOPY W/D&C N/A 09/03/2014   Procedure: DILATATION AND CURETTAGE /HYSTEROSCOPY;  Surgeon: Donnamae Jude, MD;  Location: [facility];  Service: Gynecology;  Laterality: N/A;  Requesting 09/03/14 @ 2:00p  . HYSTEROSCOPY W/D&C N/A 08/19/2015   Procedure: DILATATION AND CURETTAGE /HYSTEROSCOPY;  Surgeon: Donnamae Jude, MD;  Location: [facility];  Service: Gynecology;  Laterality: N/A;  . LITHOTRIPSY    . OTHER SURGICAL HISTORY     Uterine Prolapse/Tacking  . PROLAPSED UTERINE FIBROID LIGATION     in 1960    Family History  Problem Relation Age of Onset  . Diabetes Mother   . Heart disease Mother        MI  . Uterine cancer Mother   . Stroke Father    . Cancer Paternal Grandmother        intestinal  . Stomach cancer Maternal Grandmother   . Colon cancer Neg Hx     Social History:  reports that she has quit smoking. Her smoking use included Cigarettes. She has never used smokeless tobacco. She reports that she does not drink alcohol or use drugs.  Allergies:  Allergies  Allergen Reactions  . Albuterol Rash  . Cefdinir Diarrhea  . Levofloxacin Nausea Only  . Epinephrine Anxiety and Other (See Comments)    Heart palpitations  . Latex Rash    Prior to Admission medications   Medication Sig Start Date End Date Taking? Authorizing Provider  acetaminophen (TYLENOL) 325 MG tablet Take 325 mg by mouth every 6 (six) hours as needed for headache (pain).    [provider]  calcium citrate-vitamin D (CITRACAL+D) 315-200 MG-UNIT tablet Take 1 tablet by mouth daily with lunch. Reported on 08/09/2015    [provider]  Cholecalciferol (D3 MAXIMUM STRENGTH) 5000 units capsule Take 5,000 Units by mouth daily with lunch.     [provider]  gabapentin (NEURONTIN) 100 MG capsule Take 1 capsule (100 mg total) by mouth at bedtime. 07/07/15  Pleas Koch, NP  hyoscyamine (LEVSIN, ANASPAZ) 0.125 MG tablet Take 0.125 mg by mouth every 4 (four) hours as needed (ibs/stomach pain).     [provider]  levETIRAcetam (KEPPRA) 500 MG tablet Take 1 tablet (500 mg total) by mouth 2 (two) times daily. 06/27/16   Donzetta Starch, NP  Liniments (BLUE-EMU SUPER STRENGTH) CREA Apply 1 application topically 3 (three) times daily as needed (pain).     [provider]  Magnesium 250 MG TABS Take 250 mg by mouth daily.    [provider]  meclizine (ANTIVERT) 25 MG tablet Take 25 mg by mouth 3 (three) times daily as needed for dizziness (vertigo). Reported on 05/13/2015    [provider]  OXcarbazepine (TRILEPTAL) 150 MG tablet Take 150 mg by mouth at bedtime.     [provider]  pantoprazole  (PROTONIX) 20 MG tablet Take 1 tablet (20 mg total) by mouth 2 (two) times daily. 06/27/16   Donzetta Starch, NP  Polyethyl Glycol-Propyl Glycol (SYSTANE OP) Place 1 drop into both eyes 2 (two) times daily.    [provider]  Probiotic Product (DIGESTIVE ADVANTAGE GUMMIES) CHEW Chew 1 tablet by mouth daily.    [provider]    Medications: Prior to Admission:  (Not in a hospital admission)  Results for orders placed or performed during the hospital encounter of 07/01/16 (from the past 48 hour(s))  CBG monitoring, ED     Status: None   Collection Time: 07/01/16  1:05 PM  Result Value Ref Range   Glucose-Capillary 84 65 - 99 mg/dL   Comment 1 Notify RN    Comment 2 Document in Chart   Protime-INR     Status: None   Collection Time: 07/01/16  1:08 PM  Result Value Ref Range   Prothrombin Time 13.1 11.4 - 15.2 seconds   INR 0.99   APTT     Status: None   Collection Time: 07/01/16  1:08 PM  Result Value Ref Range   aPTT 30 24 - 36 seconds  CBC     Status: None   Collection Time: 07/01/16  1:08 PM  Result Value Ref Range   WBC 7.4 4.0 - 10.5 K/uL   RBC 4.63 3.87 - 5.11 MIL/uL   Hemoglobin 14.1 12.0 - 15.0 g/dL   HCT 41.3 36.0 - 46.0 %   MCV 89.2 78.0 - 100.0 fL   MCH 30.5 26.0 - 34.0 pg   MCHC 34.1 30.0 - 36.0 g/dL   RDW 13.0 11.5 - 15.5 %   Platelets 172 150 - 400 K/uL  Differential     Status: None   Collection Time: 07/01/16  1:08 PM  Result Value Ref Range   Neutrophils Relative % 41 %   Neutro Abs 3.0 1.7 - 7.7 K/uL   Lymphocytes Relative 49 %   Lymphs Abs 3.7 0.7 - 4.0 K/uL   Monocytes Relative 8 %   Monocytes Absolute 0.6 0.1 - 1.0 K/uL   Eosinophils Relative 2 %   Eosinophils Absolute 0.1 0.0 - 0.7 K/uL   Basophils Relative 0 %   Basophils Absolute 0.0 0.0 - 0.1 K/uL  I-stat troponin, ED     Status: None   Collection Time: 07/01/16  1:13 PM  Result Value Ref Range   Troponin i, poc 0.00 0.00 - 0.08 ng/mL   Comment 3            Comment: Due to  the release kinetics of  cTnI, a negative result within the first hours of the onset of symptoms does not rule out myocardial infarction with certainty. If myocardial infarction is still suspected, repeat the test at appropriate intervals.   I-Stat Chem 8, ED     Status: Abnormal   Collection Time: 07/01/16  1:15 PM  Result Value Ref Range   Sodium 140 135 - 145 mmol/L   Potassium 3.8 3.5 - 5.1 mmol/L   Chloride 107 101 - 111 mmol/L   BUN 15 6 - 20 mg/dL   Creatinine, Ser 0.90 0.44 - 1.00 mg/dL   Glucose, Bld 84 65 - 99 mg/dL   Calcium, Ion 1.13 (L) 1.15 - 1.40 mmol/L   TCO2 24 0 - 100 mmol/L   Hemoglobin 15.0 12.0 - 15.0 g/dL   HCT 44.0 36.0 - 46.0 %    Ct Head Code Stroke W/o Cm  Addendum Date: 07/01/2016   ADDENDUM REPORT: 07/01/2016 13:36 ADDENDUM: CLINICAL DATA: Code stroke. Left-sided weakness. Recent subarachnoid hemorrhage on the right. EXAM: CT HEAD WITHOUT CONTRAST TECHNIQUE: Contiguous axial images were obtained from the base of the skull through the vertex without intravenous contrast. COMPARISON:  MRI 06/25/2016. FINDINGS: Brain: Moderate atrophy. Chronic white matter changes bilaterally consistent with microvascular ischemia. Negative for acute infarct or mass lesion. Mild Hyperintensity in a sulcus over the right convexity. This is compatible with hemorrhage which could be subacute or chronic. According to recent records the patient was admitted on 06/25/2016 for subarachnoid hemorrhage on the right. No shift of the midline structures. Vascular:  Negative for hyperdense vessel. Skull:  Negative Sinuses/Orbits:  Negative Other:  None ASPECTS ([place] Stroke Program Early CT Score) - Ganglionic level infarction (caudate, lentiform nuclei, internal capsule, insula, M1-M3 cortex):  7 - Supraganglionic infarction (M4-M6 cortex):  3 Total score (0-10 with 10 being normal):  10 IMPRESSION: 1. Hyperdensity in the right convexity sulcus compatible with small volume subarachnoid  hemorrhage. This was present on the MRI of 06/25/2016. This may be subacute blood, however It is possible this is an area of iron deposition due to chronic hemorrhage. Follow-up CT scans will be helpful to determine if this area changes over time. 2. ASPECTS is 10 These results were called by telephone at the time of interpretation on 07/01/2016 at 1:30 pm to Dr. Theda Belfast , who verbally acknowledged these results. Electronically Signed   By: Franchot Gallo M.D.   On: 07/01/2016 13:36   Result Date: 07/01/2016 CLINICAL DATA:  Code stroke. EXAM: CT HEAD WITHOUT CONTRAST TECHNIQUE: Contiguous axial images were obtained from the base of the skull through the vertex without intravenous contrast. COMPARISON:  None. FINDINGS: Brain: Vascular: Skull: Sinuses/Orbits: Other: ASPECTS ([place] Stroke Program Early CT Score) - Ganglionic level infarction (caudate, lentiform nuclei, internal capsule, insula, M1-M3 cortex): - Supraganglionic infarction (M4-M6 cortex): Total score (0-10 with 10 being normal): IMPRESSION: 1. 2. ASPECTS is Electronically Signed: By: Franchot Gallo M.D. On: 07/01/2016 13:15    ROS Blood pressure (!) 149/58, pulse 73, resp. rate 16, SpO2 98 %. Neurologic Examination:  Awake, alert, fully oriented. Language - fluent, comprehension, naming, repetition- intact Face symmetric Tongue midline. Strength 5/5 BUE and BLE Coord- intact Sensory- reduce on the left side  Assessment/Plan:  IV tPA contraindicated with history of ICH.  The current size of residual blood is very small.  Her BP is relatively well controlled.  I suspect she may have a simple partial seizure due to cortical damage from the prior [facility] prompting this ER visit.  She will not tolerate higher Keppra doses.  I will recommend discontinuing that and replacing it with Vimpat 50 mg bid.  After one week, she is to increase it to 100 mg bid.  Follow up with stroke clinic.  May discharge home from my point of view.   Rogue Jury, MD 07/01/2016, 1:48 PM

## 2016-07-01 NOTE — ED Notes (Signed)
Pt passed swallow screen

## 2016-07-01 NOTE — ED Triage Notes (Signed)
Son  Reports he saw  Pt at 0800  Today  And Pt was  Felt  To be normal. Pt called  Son at 74  Today to report she did not feel good.  Pt was transported via EMS to [facility] ED . On arrival to ED PT alert A/O  x4  On arrival .

## 2016-07-01 NOTE — Discharge Instructions (Signed)
Follow-up as suggested by the neurologist. Stopped taking the Keppra and start taking Vimpat.

## 2016-07-01 NOTE — Code Documentation (Signed)
Patient last known well this am at 800.  At 1155 she called her son because she noted her left arm felt odd and was weak.  Patient arrived via EMS at 1301, stat labs and head CT done.  NIHSS 2, mild left facial droop and left facial sensory loss.  She states that her left arm still feels odd but is moving better than at home.  Neurologist at bedside to assess patient.  Family at bedside.

## 2016-07-03 ENCOUNTER — Encounter: Payer: Self-pay | Admitting: Primary Care

## 2016-07-03 ENCOUNTER — Other Ambulatory Visit: Payer: Medicare Other

## 2016-07-03 NOTE — Telephone Encounter (Signed)
See my chart message regarding Rx for a walker. Rx is in your inbox. Will you please notify patient when this has been completed?

## 2016-07-03 NOTE — Telephone Encounter (Signed)
Faxed Rx to [facility] and called him as well.

## 2016-07-05 ENCOUNTER — Other Ambulatory Visit: Payer: Self-pay | Admitting: *Deleted

## 2016-07-05 NOTE — Patient Outreach (Signed)
Shari Vincent [facility]) Care Management  07/05/2016  Shari Vincent Apr 20, 1931 [id number]  EMMI-Stroke referral for red dashboard Day#6 07/04/2016 "feeling worse overall":  Telephone call to patient who was advised of reason for call. HIPPA verification received.   Patient voices that she did not answer yes to question and that she was feeling better overall. States she was recently discharged from hospital with diagnosis of subarachnoid hemorrhage. States currently all  symptoms that she had prior to being hospitalized are gone. States if she develops numbness, weakness or trouble speaking she will call 911 or have spouse call.  States she has all of prescribed medications & is taking as ordered by her MD. States spouse does assist her with medication management. States she does monitor her blood pressure & its has been within normal range.   Voices she has hospital follow up appointments scheduled & daughter will be taking her.  States to questions or concerns at this time.  States she has stroke booklet but will accept literature on signs & symptoms.  Plan: Send educational literature as means of re-enforcement for signs & symptoms of Stroke. Send case to care management assistant to close out.  Shari Daisy, RN BSN Shari Vincent Management Coordinator [facility] Care Management  [phone]

## 2016-07-06 ENCOUNTER — Encounter: Payer: Self-pay | Admitting: *Deleted

## 2016-07-12 ENCOUNTER — Ambulatory Visit: Payer: Self-pay | Admitting: Primary Care

## 2016-07-13 ENCOUNTER — Encounter: Payer: Self-pay | Admitting: Primary Care

## 2016-07-13 ENCOUNTER — Encounter: Payer: Self-pay | Admitting: Neurology

## 2016-07-14 ENCOUNTER — Telehealth: Payer: Self-pay | Admitting: Neurology

## 2016-07-14 DIAGNOSIS — I680 Cerebral amyloid angiopathy: Secondary | ICD-10-CM

## 2016-07-14 DIAGNOSIS — G458 Other transient cerebral ischemic attacks and related syndromes: Secondary | ICD-10-CM

## 2016-07-14 NOTE — Telephone Encounter (Signed)
Talked to pt over the phone. She said that keppra made her too drowsy and she has to change to vimpat. She is taking 50mg  bid and she said she still has some episodes of left jaw numbness and left leg numbness but the frequency and severity is much improved from before.   I told her to increase vimpat to 100mg  bid and I will prescribe her more refills next week when I am in office. She is also on trileptal 150mg  [facility] for presumed TN (but she does not have it). So if higher dose of vimpat still can not eliminate the episodes, we may consider to increase trileptal to gain better control or add lamictal.  She has appointment with Dr. Jaynee Eagles 08/14/16. Pt is aware. I told her that if her symptoms getting worse, or has HA, N/V, weakness, numbness, neuro changes, she needs to go to ER or call 911 to rule out brain bleed. Pt expressed understanding and appreciation.  Rosalin Hawking, MD PhD Stroke Neurology 07/14/2016 6:53 PM

## 2016-07-14 NOTE — Telephone Encounter (Signed)
[name] wants a sooner appointment. Since patient was seen by Dr. Erlinda Hong in the hospital she can probably be ssen by Chrys Racer who has sooner appointments. I am CCing Dr. Erlinda Hong to ensure he agrees  Delsa Sale, can you call son and get patient seen sooner on Caroline's schedule? I did tell him if she is having new or worsening symptoms she should be taken to the ED as I have never seen or examined patient and tus can't give medical advice until seen in office thanks

## 2016-07-20 NOTE — Telephone Encounter (Signed)
Pt called said [facility] does not have refill for vimpat. Please call pt when it has been sent

## 2016-07-20 NOTE — Telephone Encounter (Signed)
Rn spoke with patient about her vimpat rx. Pt only has 14 pills of the vimpat 50mg . Pt know needs 100mg  tablet one pill twice a day. Rn stated the rx will be fax to [facility] once Dr .Erlinda Hong does a new rx. Pt verbalized understanding.

## 2016-07-20 NOTE — Telephone Encounter (Signed)
Rn call Dr. Erlinda Hong via phone. Pt was prescribed vimpat.Rn stated patient needs 100mg  vimpat one tablet twice a day to be fax to [facility] in [place]. Dr Erlinda Hong is the work in am md and he will come to [facility] on Friday and do a new 100mg  vimpat one tablet twice a day. Rn stated to please see Bedelia Person work in am nurse to fax over [place].

## 2016-07-20 NOTE — Telephone Encounter (Signed)
Dr. Erlinda Hong, please find Shari Vincent the work in am nurse. She will fax over the vimpat 100mg  to [facility]. She is aware.

## 2016-07-21 MED ORDER — LACOSAMIDE 100 MG PO TABS: 100.0000 mg | ORAL_TABLET | Freq: Two times a day (BID) | ORAL | 5 refills | Status: DC

## 2016-07-21 NOTE — Telephone Encounter (Signed)
Fax confirmation received [facility] for vimpat [phone].  Relayed to pt that faxed.  She verbalized understanding.

## 2016-07-21 NOTE — Addendum Note (Signed)
Addended by: Rosalin Hawking on: 07/21/2016 09:51 AM   Modules accepted: Orders

## 2016-07-21 NOTE — Telephone Encounter (Signed)
I did the prescription and gave to [facility] to fax to pharmacy. Thanks.   Rosalin Hawking, MD PhD Stroke Neurology 07/21/2016 9:49 AM

## 2016-08-04 ENCOUNTER — Ambulatory Visit
Admission: RE | Admit: 2016-08-04 | Discharge: 2016-08-04 | Disposition: A | Discharge status: Home / Self Care | Payer: Medicare Other | Source: Ambulatory Visit | Attending: Primary Care | Admitting: Primary Care

## 2016-08-04 DIAGNOSIS — M810 Age-related osteoporosis without current pathological fracture: Secondary | ICD-10-CM

## 2016-08-14 ENCOUNTER — Ambulatory Visit (INDEPENDENT_AMBULATORY_CARE_PROVIDER_SITE_OTHER): Payer: Medicare Other | Admitting: Neurology

## 2016-08-14 ENCOUNTER — Encounter: Payer: Self-pay | Admitting: Neurology

## 2016-08-14 VITALS — BP 137/67 | HR 67 | Ht 59.75 in | Wt 164.4 lb

## 2016-08-14 DIAGNOSIS — R2689 Other abnormalities of gait and mobility: Secondary | ICD-10-CM | POA: Diagnosis not present

## 2016-08-14 DIAGNOSIS — I609 Nontraumatic subarachnoid hemorrhage, unspecified: Secondary | ICD-10-CM | POA: Diagnosis not present

## 2016-08-14 DIAGNOSIS — R42 Dizziness and giddiness: Secondary | ICD-10-CM | POA: Diagnosis not present

## 2016-08-14 DIAGNOSIS — R531 Weakness: Secondary | ICD-10-CM

## 2016-08-14 DIAGNOSIS — Z9181 History of falling: Secondary | ICD-10-CM

## 2016-08-14 NOTE — Patient Instructions (Signed)
Remember to drink plenty of fluid, eat healthy meals and do not skip any meals. Try to eat protein with a every meal and eat a healthy snack such as fruit or nuts in between meals. Try to keep a regular sleep-wake schedule and try to exercise daily, particularly in the form of walking, 20-30 minutes a day, if you can.   As far as your medications are concerned, I would like to suggest: Continue Vimpat  As far as diagnostic testing: In 4 months need an eeg and then discuss stopping the vimpat  I would like to see you back in 4 months, sooner if we need to. Please call us with any interim questions, concerns, problems, updates or refill requests.   Our phone number is [phone]. We also have an after hours call service for urgent matters and there is a physician on-call for urgent questions. For any emergencies you know to call 911 or go to the nearest emergency room

## 2016-08-14 NOTE — Progress Notes (Signed)
[facility]    Provider:  Dr Jaynee Eagles Referring Provider: Pleas Koch, NP Primary Care Physician:  Pleas Koch, NP  CC:  Follow-up on subarachnoid hemorrhage  HPI:  Shari Vincent is a 83 y.o. female here as a referral from Dr. Carlis Abbott for follow-up on subarachnoid hemorrhage. Review of  Records shows: patient was transferred from an outside hospital for evaluation of right-sided subarachnoid hemorrhage in early May of this year. Patient had been having episodes of left-sided numbness and weakness for 2 weeks. This included tingling, weakness, difficulty moving the arm, tingling of the left face, numbness and tingling of the left arm and the outside of her left leg also with a mild facial droop. A CT was obtained and showed a small amount of subarachnoid hemorrhage involving the right frontal convexity CTA showed no obvious aneurysms and she was transferred to [facility] for further management. NIH stroke scale in the emergency room was 1. She is here with her daughter who provides much information. Patient today says she has some residual numbness. It comes and goes. She had headaches but now have resolved. She had side effects to Keppra and she is on Vimpat now. She lives with her husband. They have lived in the same house for 60+ years and have been married that long too. Symptoms had been waxing and waning for a week before they went to the ED. Her arm went weak, then leg and face numbness and tingling. Some mild aphasia with words. No other focal neurologic deficits, associated symptoms, inciting events or modifiable factors. She has imbalance.   Reviewed notes, labs and imaging from outside physicians, which showed:  Personally reviewed images and agree with the following:  Mr Brain Wo Contrast Mr Angiogram Head Wo Contrast 06/25/2016  1. Trace subarachnoid hemorrhage along the right superior frontal convexity seen in two adjacent sulci. No associated acute  infarct or other acute signal abnormality in the region. 2. 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 No other chronic cerebral blood products or superficial siderosis identified. 3. Intracranial MRA appears stable since 2012 with no intracranial stenosis or aneurysm identified. The entire area of subarachnoid hemorrhage was not covered by MRA but no cerebral AVM is suspected. 4. Chronically advanced and mildly progressed nonspecific cerebral white matter signal changes since 2012. There are also two small areas of cortical encephalomalacia in the right inferior frontal and right posterior parietal lobe which are new since 2012. Several tiny chronic cerebellar lacunar infarcts are also new.   Reviewed EEG report which was abnormal demonstrating a very mild diffuse slowing of activity but no focal, hemispheric or lateralizing features. No epileptiform activity was recorded.   Review of Systems: Patient complains of symptoms per HPI as well as the following symptoms: memory loss, headache, numbness, weakness, sleepiness, flushing, moles. Pertinent negatives and positives per HPI. All others negative.   Social History   Social History  . Marital status: Married    Spouse name: N/A  . Number of children: N/A  . Years of education: 55   Occupational History  . Not on file.   Social History Main Topics  . Smoking status: Former Smoker    Types: Cigarettes  . Smokeless tobacco: Never Used  . Alcohol use No  . Drug use: No  . Sexual activity: Not Currently    Birth control/ protection: Post-menopausal   Other Topics Concern  .  Not on file   Social History Narrative   Married 62 years.   Has three children, three grandchildren, one great grandchildren.   Enjoys going to church, baking, shop.   Caffeine: occasional tea when eating out   Right-handed    Family History  Problem  Relation Age of Onset  . Diabetes Mother   . Heart disease Mother        MI  . Uterine cancer Mother   . Stroke Father   . Cancer Paternal Grandmother        intestinal  . Stomach cancer Maternal Grandmother   . Colon cancer Neg Hx     Past Medical History:  Diagnosis Date  . Acid reflux   . Arthritis    right knee  . Bulging lumbar disc   . Cancer ([place])    Basal Cell on right shoulder  . Colon polyps   . Diverticulitis   . High cholesterol   . Irritable bowel syndrome (IBS)   . Kidney stone   . Knee pain    right  . Sciatic pain   . Trigeminal neuralgia   . Trigeminal neuralgia of right side of face   . Trigeminal neuralgia syndrome   . Vertigo     Past Surgical History:  Procedure Laterality Date  . BREAST SURGERY     biopsy in 1990   . CATARACT EXTRACTION    . COLONOSCOPY  05/27/2008   TDV:[id number] rectal polyp,s/p bx/sigmoid diverticula. hyperplastic polyps. benign random bx  . ESOPHAGOGASTRODUODENOSCOPY  05/27/2008   GYI:RSWNIO/EVOJJ HH  . EYE SURGERY    . FOOT SURGERY     Left foot, metal plate  . HYSTEROSCOPY W/D&C N/A 09/03/2014   Procedure: DILATATION AND CURETTAGE /HYSTEROSCOPY;  Surgeon: Donnamae Jude, MD;  Location: [facility];  Service: Gynecology;  Laterality: N/A;  Requesting 09/03/14 @ 2:00p  . HYSTEROSCOPY W/D&C N/A 08/19/2015   Procedure: DILATATION AND CURETTAGE /HYSTEROSCOPY;  Surgeon: Donnamae Jude, MD;  Location: [facility];  Service: Gynecology;  Laterality: N/A;  . LITHOTRIPSY    . OTHER SURGICAL HISTORY     Uterine Prolapse/Tacking  . PROLAPSED UTERINE FIBROID LIGATION     in 1960    Current Outpatient Prescriptions  Medication Sig Dispense Refill  . acetaminophen (TYLENOL) 325 MG tablet Take 325 mg by mouth every 6 (six) hours as needed for headache (pain).    . calcium citrate-vitamin D (CITRACAL+D) 315-200 MG-UNIT tablet Take 1 tablet by mouth daily with lunch. Reported on 08/09/2015    . Cholecalciferol (D3 MAXIMUM STRENGTH) 5000 units  capsule Take 5,000 Units by mouth daily with lunch.     . gabapentin (NEURONTIN) 100 MG capsule Take 1 capsule (100 mg total) by mouth at bedtime. 90 capsule 1  . hyoscyamine (LEVSIN, ANASPAZ) 0.125 MG tablet Take 0.125 mg by mouth every 4 (four) hours as needed (ibs/stomach pain).     . Lacosamide 100 MG TABS Take 1 tablet (100 mg total) by mouth 2 (two) times daily. 60 tablet 5  . Liniments (BLUE-EMU SUPER STRENGTH) CREA Apply 1 application topically 3 (three) times daily as needed (pain).     . meclizine (ANTIVERT) 25 MG tablet Take 25 mg by mouth 3 (three) times daily as needed for dizziness (vertigo). Reported on 05/13/2015    . OXcarbazepine (TRILEPTAL) 150 MG tablet Take 150 mg by mouth at bedtime.     . pantoprazole (PROTONIX) 20 MG tablet Take 1 tablet (20 mg total) by mouth 2 (two)  times daily. 60 tablet 2  . Polyethyl Glycol-Propyl Glycol (SYSTANE OP) Place 1 drop into both eyes 2 (two) times daily.    . Probiotic Product (DIGESTIVE ADVANTAGE GUMMIES) CHEW Chew 1 tablet by mouth daily.    . Magnesium 250 MG TABS Take 250 mg by mouth daily.     No current facility-administered medications for this visit.     Allergies as of 08/14/2016 - Review Complete 08/14/2016  Allergen Reaction Noted  . Albuterol Rash 05/24/2014  . Cefdinir Diarrhea 07/23/2012  . Levofloxacin Nausea Only 10/22/2014  . Epinephrine Anxiety and Other (See Comments) 06/23/2016  . Latex Rash 08/09/2015    Vitals: BP 137/67   Pulse 67   Ht 4' 11.75" (1.518 m)   Wt 164 lb 6.4 oz (74.6 kg)   BMI 32.38 kg/m  Last Weight:  Wt Readings from Last 1 Encounters:  08/14/16 164 lb 6.4 oz (74.6 kg)   Last Height:   Ht Readings from Last 1 Encounters:  08/14/16 4' 11.75" (1.518 m)   Physical exam: Exam: Gen: NAD, conversant, well nourised, well groomed                     CV: RRR, no MRG. No Carotid Bruits. No peripheral edema, warm, nontender Eyes: Conjunctivae clear without exudates or  hemorrhage  Neuro: Detailed Neurologic Exam  Speech:    Speech is normal; fluent and spontaneous with normal comprehension.  Cognition:    The patient is oriented to person, place, and time;     recent and remote memory intact;     language fluent;     normal attention, concentration,     fund of knowledge Cranial Nerves:    The pupils are equal, round, and reactive to light. Attempted fundoscopic exam could not visualize due to small pupils. Visual fields are full to finger confrontation. Extraocular movements are intact. Trigeminal sensation is intact and the muscles of mastication are normal. The face is symmetric. The palate elevates in the midline. Hearing intact. Voice is normal. Shoulder shrug is normal. The tongue has normal motion without fasciculations.   Coordination:    No dysmetria  Gait:    Imbalance, wide based gait, cannot tandem heel or toe  Motor Observation:    No asymmetry, no atrophy, and no involuntary movements noted. Tone:    Normal muscle tone.    Posture:    Posture is normal. normal erect    Strength: Proximal weakness     Sensation: intact to LT     Reflex Exam:  DTR's:    Deep tendon reflexes in the upper and lower extremities are symmetrical bilaterally.   Toes:    The toes are equivocal bilaterally.   Clonus:    Clonus is absent.       Assessment/Plan:  This is an 83 year old female with a history of hyperlipidemia, trigeminal neuralgia, diverticulitis who was transferred to [facility] in early May of this year for left-sided numbness, weakness paresthesias left facial droop. CT scan showed a subarachnoid hemorrhage. 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 MRA was unremarkable, echocardiogram showed grade 2 diastolic dysfunction otherwise unremarkable, carotid Dopplers 1-39% stenosis involving the right internal carotid artery and the left  internal carotid artery, lipid panel showed a total cholesterol of 226 and an LDL of 128 discussed needs follow up with PCP for management, hemoglobin A1c 5.7. Gait is cerebral amyloid angiopathy recommend avoiding antiplatelets. Patient  was started on Keppra 500 mg twice daily.  Physical therapy for weakness and imbalance, gait and safety evaluation and home safety evaluation. Needs a nurse to discuss stroke and manage medications. Near falls. Imbalance. Proximal weakness. She cannot leave home without assistance. Home PT.   Will follow up in 4 months. At that time will order an eeg and CT and if the blood is resorbed and the EEG does not show any epileptiform activity we can titrate off of the Vimpat and then the Trileptal (has been on Trileptal for years for TN without recurrent symptoms for years).   Will follow up with Ward Givens at that time.   Orders Placed This Encounter  Procedures  . Ambulatory referral to Home Health  . Ambulatory referral to [place] [address] [place], [place] [address]  Phone [phone] Fax [phone]

## 2016-08-15 ENCOUNTER — Telehealth: Payer: Self-pay | Admitting: *Deleted

## 2016-08-15 NOTE — Telephone Encounter (Signed)
[facility] requesting pt medical records labs or imaging reports

## 2016-08-16 ENCOUNTER — Other Ambulatory Visit: Payer: Self-pay | Admitting: Primary Care

## 2016-08-16 DIAGNOSIS — IMO0001 Reserved for inherently not codable concepts without codable children: Secondary | ICD-10-CM

## 2016-08-16 NOTE — Addendum Note (Signed)
Addended by: Monte Fantasia on: 08/16/2016 10:04 AM   Modules accepted: Orders

## 2016-08-17 ENCOUNTER — Telehealth: Payer: Self-pay | Admitting: Neurology

## 2016-08-17 NOTE — Telephone Encounter (Signed)
Annette physical therapist with [name] called for PT evaluation report- 2 times per week for 4 weeks working on balance, gait training and strengthening and add OT evaluate and treat.  Will send over orders for signatures when completed.  Please call

## 2016-08-17 NOTE — Telephone Encounter (Signed)
Called back and gave verbal approval for therapy as requested. Will sign and fax back orders when they are received.

## 2016-09-15 ENCOUNTER — Telehealth: Payer: Self-pay

## 2016-09-15 NOTE — Telephone Encounter (Signed)
Received fax from [facility]. Pt has been d/c'd to home/self care. Placed in Dr. Cathren Laine box for review.

## 2016-09-26 ENCOUNTER — Ambulatory Visit (INDEPENDENT_AMBULATORY_CARE_PROVIDER_SITE_OTHER): Payer: Medicare Other | Admitting: Internal Medicine

## 2016-09-26 ENCOUNTER — Encounter: Payer: Self-pay | Admitting: Internal Medicine

## 2016-09-26 VITALS — BP 149/73 | HR 69 | Temp 98.1°F | Ht 60.0 in | Wt 160.0 lb

## 2016-09-26 DIAGNOSIS — K219 Gastro-esophageal reflux disease without esophagitis: Secondary | ICD-10-CM | POA: Diagnosis not present

## 2016-09-26 DIAGNOSIS — K588 Other irritable bowel syndrome: Secondary | ICD-10-CM

## 2016-09-26 NOTE — Progress Notes (Addendum)
Primary Care Physician:  Pleas Koch, NP Primary Gastroenterologist:  Dr. Gala Romney  Pre-Procedure History & Physical: HPI:  Shari Vincent is a 83 y.o. female here for follow-up of GERD and intermittent postprandial abdominal cramps and diarrhea. Since last seen here, she suffered a CVA with essentially complete recovery of neurologic function. She was noted to have a small bleed. She takes Protonix 20 mg twice daily with excellent control of her reflux. No dysphagia. Hasn't had any bowel symptoms whatsoever until last night when she ate some lettuce and had some postprandial abdominal cramps and diarrhea; she took a hyoscyamine with relief. She has not had to take this medication since her last visit she has not had any other bowel symptoms.  Past Medical History:  Diagnosis Date  . Acid reflux   . Arthritis    right knee  . Bulging lumbar disc   . Cancer ([place])    Basal Cell on right shoulder  . Colon polyps   . Diverticulitis   . High cholesterol   . Irritable bowel syndrome (IBS)   . Kidney stone   . Knee pain    right  . Sciatic pain   . Trigeminal neuralgia   . Trigeminal neuralgia of right side of face   . Trigeminal neuralgia syndrome   . Vertigo     Past Surgical History:  Procedure Laterality Date  . BREAST SURGERY     biopsy in 1990   . CATARACT EXTRACTION    . COLONOSCOPY  05/27/2008   NKN:[id number] rectal polyp,s/p bx/sigmoid diverticula. hyperplastic polyps. benign random bx  . ESOPHAGOGASTRODUODENOSCOPY  05/27/2008   XTK:[id number]/ZHGDJ HH  . EYE SURGERY    . FOOT SURGERY     Left foot, metal plate  . HYSTEROSCOPY W/D&C N/A 09/03/2014   Procedure: DILATATION AND CURETTAGE /HYSTEROSCOPY;  Surgeon: Donnamae Jude, MD;  Location: [facility];  Service: Gynecology;  Laterality: N/A;  Requesting 09/03/14 @ 2:00p  . HYSTEROSCOPY W/D&C N/A 08/19/2015   Procedure: DILATATION AND CURETTAGE /HYSTEROSCOPY;  Surgeon: Donnamae Jude, MD;  Location: [facility];  Service:  Gynecology;  Laterality: N/A;  . LITHOTRIPSY    . OTHER SURGICAL HISTORY     Uterine Prolapse/Tacking  . PROLAPSED UTERINE FIBROID LIGATION     in 1960    Prior to Admission medications   Medication Sig Start Date End Date Taking? Authorizing Provider  acetaminophen (TYLENOL) 325 MG tablet Take 325 mg by mouth every 6 (six) hours as needed for headache (pain).   Yes [provider]  calcium citrate-vitamin D (CITRACAL+D) 315-200 MG-UNIT tablet Take 1 tablet by mouth daily with lunch. Reported on 08/09/2015   Yes [provider]  Cholecalciferol (D3 MAXIMUM STRENGTH) 5000 units capsule Take 5,000 Units by mouth daily with lunch.    Yes [provider]  gabapentin (NEURONTIN) 100 MG capsule TAKE ONE CAPSULE BY MOUTH ONCE DAILY AT BEDTIME 08/16/16  Yes Pleas Koch, NP  hyoscyamine (LEVSIN, ANASPAZ) 0.125 MG tablet Take 0.125 mg by mouth every 4 (four) hours as needed (ibs/stomach pain).    Yes [provider]  Lacosamide 100 MG TABS Take 1 tablet (100 mg total) by mouth 2 (two) times daily. 07/21/16  Yes Rosalin Hawking, MD  Liniments (BLUE-EMU SUPER STRENGTH) CREA Apply 1 application topically 3 (three) times daily as needed (pain).    Yes [provider]  Magnesium 250 MG TABS Take 250 mg by mouth daily.   Yes [provider]  meclizine (ANTIVERT) 25 MG tablet Take 25 mg by mouth 3 (three) times daily as needed for dizziness (vertigo). Reported on 05/13/2015   Yes [provider]  OXcarbazepine (TRILEPTAL) 150 MG tablet Take 150 mg by mouth at bedtime.    Yes [provider]  pantoprazole (PROTONIX) 20 MG tablet Take 1 tablet (20 mg total) by mouth 2 (two) times daily. 06/27/16  Yes Donzetta Starch, NP  Polyethyl Glycol-Propyl Glycol (SYSTANE OP) Place 1 drop into both eyes 2 (two) times daily.   Yes [provider]  Probiotic Product (DIGESTIVE ADVANTAGE GUMMIES) CHEW Chew 1 tablet by mouth daily.   Yes [provider]  VIMPAT 50 MG TABS tablet Take 50 mg by mouth 2 (two) times daily. 07/01/16  Yes [provider]    Allergies as of 09/26/2016 - Review Complete 09/26/2016  Allergen Reaction Noted  . Albuterol Rash 05/24/2014  . Cefdinir Diarrhea 07/23/2012  . Levofloxacin Nausea Only 10/22/2014  . Epinephrine Anxiety and Other (See Comments) 06/23/2016  . Latex Rash 08/09/2015    Family History  Problem Relation Age of Onset  . Diabetes Mother   . Heart disease Mother        MI  . Uterine cancer Mother   . Stroke Father   . Cancer Paternal Grandmother        intestinal  . Stomach cancer Maternal Grandmother   . Colon cancer Neg Hx     Social History   Social History  . Marital status: Married    Spouse name: N/A  . Number of children: N/A  . Years of education: 42   Occupational History  . Not on file.   Social History Main Topics  . Smoking status: Former Smoker    Types: Cigarettes  . Smokeless tobacco: Never Used  . Alcohol use No  . Drug use: No  . Sexual activity: Not Currently    Birth control/ protection: Post-menopausal   Other Topics Concern  . Not on file   Social History Narrative   Married 62 years.   Has three children, three grandchildren, one great grandchildren.   Enjoys going to church, baking, shop.   Caffeine: occasional tea when eating out   Right-handed    Review of Systems: See HPI, otherwise negative ROS  Physical Exam: BP (!) 149/73   Pulse 69   Temp 98.1 F (36.7 C) (Oral)   Ht 5' (1.524 m)   Wt 160 lb (72.6 kg)   BMI 31.25 kg/m  General:   Alert,   pleasant and cooperative in NAD. Accompanied by son.  Impression: Very pleasant 89 year old lady with a long history of GERD. Symptoms now well controlled on Protonix 20 mg orally twice daily. Patient has very few and far between episodes of a bowel cramps nonbloody diarrhea most consistent with irritable bowel syndrome. Things like lettuce and greasy foods inside her  symptoms. Hyoscyamine works when she needs to take it. There are no alarm symptoms.  I do not feel further evaluation is warranted.  Recommendations: Continue protonix 20 mg twice daily  Use hyoscyamine as needed  Office visit in 6 months   GERD and IBS info provided                    Notice: This dictation was prepared with Dragon dictation along with smaller phrase technology. Any transcriptional errors that result from this process are unintentional and may not be corrected upon review.

## 2016-09-26 NOTE — Patient Instructions (Addendum)
Continue protonix 20 mg twice daily  Use hyoscyamine as needed  Office visit in 6 months   GERD and IBS info provided

## 2016-09-27 ENCOUNTER — Encounter: Payer: Self-pay | Admitting: Internal Medicine

## 2016-10-04 ENCOUNTER — Encounter: Payer: Self-pay | Admitting: Primary Care

## 2016-10-04 ENCOUNTER — Ambulatory Visit (INDEPENDENT_AMBULATORY_CARE_PROVIDER_SITE_OTHER): Payer: Medicare Other | Admitting: Primary Care

## 2016-10-04 VITALS — BP 120/76 | HR 70 | Temp 98.5°F | Ht 60.0 in | Wt 163.8 lb

## 2016-10-04 DIAGNOSIS — I609 Nontraumatic subarachnoid hemorrhage, unspecified: Secondary | ICD-10-CM

## 2016-10-04 DIAGNOSIS — R29898 Other symptoms and signs involving the musculoskeletal system: Secondary | ICD-10-CM

## 2016-10-04 DIAGNOSIS — G500 Trigeminal neuralgia: Secondary | ICD-10-CM | POA: Diagnosis not present

## 2016-10-04 DIAGNOSIS — I680 Cerebral amyloid angiopathy: Secondary | ICD-10-CM

## 2016-10-04 DIAGNOSIS — R32 Unspecified urinary incontinence: Secondary | ICD-10-CM | POA: Diagnosis not present

## 2016-10-04 DIAGNOSIS — G8929 Other chronic pain: Secondary | ICD-10-CM | POA: Diagnosis not present

## 2016-10-04 DIAGNOSIS — E854 Organ-limited amyloidosis: Secondary | ICD-10-CM | POA: Diagnosis not present

## 2016-10-04 DIAGNOSIS — K580 Irritable bowel syndrome with diarrhea: Secondary | ICD-10-CM | POA: Diagnosis not present

## 2016-10-04 DIAGNOSIS — M5441 Lumbago with sciatica, right side: Secondary | ICD-10-CM

## 2016-10-04 DIAGNOSIS — K219 Gastro-esophageal reflux disease without esophagitis: Secondary | ICD-10-CM

## 2016-10-04 LAB — POC URINALSYSI DIPSTICK (AUTOMATED)
Bilirubin, UA: NEGATIVE
Blood, UA: NEGATIVE
Glucose, UA: NEGATIVE
Ketones, UA: NEGATIVE
Leukocytes, UA: NEGATIVE
Nitrite, UA: NEGATIVE
Protein, UA: NEGATIVE
Spec Grav, UA: 1.015 (ref 1.010–1.025)
Urobilinogen, UA: 0.2 E.U./dL
pH, UA: 6 (ref 5.0–8.0)

## 2016-10-04 NOTE — Patient Instructions (Addendum)
Complete lab work prior to leaving today. I will notify you of your results once received.   Schedule a follow up visit in 6 months.  It was a pleasure to see you today!

## 2016-10-04 NOTE — Assessment & Plan Note (Signed)
Following with Neurology. Working with home PT and regaining strength.

## 2016-10-04 NOTE — Assessment & Plan Note (Addendum)
Sounds to be a mix of urge vs functional incontinence. Discussed options for treatment including physical therapy, kegal exercises, medications. Will hold off on medications until she's weaned off of her Vimpat and Trileptal. Overall she's able to manage symptoms. UA today unremarkable.

## 2016-10-04 NOTE — Addendum Note (Signed)
Addended by: Jacqualin Combes on: 10/04/2016 04:29 PM   Modules accepted: Orders

## 2016-10-04 NOTE — Assessment & Plan Note (Signed)
Improved with home physical therapy. Tylenol use PRN.

## 2016-10-04 NOTE — Assessment & Plan Note (Signed)
No aspirin or statin use. No headaches. Neuro exam today unremarkable.

## 2016-10-04 NOTE — Assessment & Plan Note (Signed)
No recent IBS symptoms, feels well managed, recently saw GI MD.

## 2016-10-04 NOTE — Assessment & Plan Note (Signed)
Improving with home PT. Continue same.

## 2016-10-04 NOTE — Assessment & Plan Note (Signed)
Neurology plans on weaning off of Trileptal in the near future.

## 2016-10-04 NOTE — Progress Notes (Signed)
Subjective:    Patient ID: Shari Vincent, female    DOB: 19-Jun-1931, 83 y.o.   MRN: [id number]  HPI  Ms. Luedke is an 83 year old female who presents today for follow up.  1) Subarachnoid Hemorrhage and Cerebral Amyloid Angiopathy: Admitted on May 6th 2018, discharged home on May 8th 2018. Initiated on Binger and referred to Neurology. Neurology switched her to Vimpat given side effects of Keppra. Once evaluated per Neurology it was recommended to titrate off of Vimpat and also Trileptal for trigeminal neuralgia that was diagnosed years ago. She has not started titration off of these medications as she thinks they will start this during her follow up visit.  She was prescribed home physical therapy and is due for follow up with Neurology with repeat CT and EEG in October 2018.  2) IBS/GERD: Currently managed on hyoscyamine 0.125 mg, pantoprazole. Recently evaluated by her GI doctor last week who reported an unremarkable and stable exam. No interventions were done. Continue current regimen.  3) Chronic Back Pain with Sciatica: Currently managed on gabapentin. Working with physical therapy for recent CVA who is also helping with sciatic pain. She's noticed improvement to her back and sciatic pain since initiation of PT.  4) Urge/Functional Incontinence: Difficulty controlling urine, sometimes can't make it to the bathroom. This has been present for the past six months, worse since May 2018. She wears a pad most of the time. She denies hematuria, dysuria, vaginal bleeding. Does endorse urinary frequency.   Review of Systems  Constitutional: Negative for fever.  Respiratory: Negative for shortness of breath.   Cardiovascular: Negative for chest pain.  Genitourinary: Positive for frequency. Negative for dysuria, hematuria, vaginal bleeding and vaginal discharge.       Incontinence of urine, intermittent  Musculoskeletal:       Lower back pain has improve with PT.  Neurological: Negative for  numbness.       Weakness improving with PT       Past Medical History:  Diagnosis Date  . Acid reflux   . Arthritis    right knee  . Bulging lumbar disc   . Cancer ([place])    Basal Cell on right shoulder  . Colon polyps   . Diverticulitis   . High cholesterol   . Irritable bowel syndrome (IBS)   . Kidney stone   . Knee pain    right  . Sciatic pain   . Trigeminal neuralgia   . Trigeminal neuralgia of right side of face   . Trigeminal neuralgia syndrome   . Vertigo      Social History   Social History  . Marital status: Married    Spouse name: N/A  . Number of children: N/A  . Years of education: 59   Occupational History  . Not on file.   Social History Main Topics  . Smoking status: Former Smoker    Types: Cigarettes  . Smokeless tobacco: Never Used  . Alcohol use No  . Drug use: No  . Sexual activity: Not Currently    Birth control/ protection: Post-menopausal   Other Topics Concern  . Not on file   Social History Narrative   Married 62 years.   Has three children, three grandchildren, one great grandchildren.   Enjoys going to church, baking, shop.   Caffeine: occasional tea when eating out   Right-handed    Past Surgical History:  Procedure Laterality Date  . BREAST SURGERY     biopsy in  1990   . CATARACT EXTRACTION    . COLONOSCOPY  05/27/2008   SWN:[id number] rectal polyp,s/p bx/sigmoid diverticula. hyperplastic polyps. benign random bx  . ESOPHAGOGASTRODUODENOSCOPY  05/27/2008   [facility]:[id number]  . EYE SURGERY    . FOOT SURGERY     Left foot, metal plate  . HYSTEROSCOPY W/D&C N/A 09/03/2014   Procedure: DILATATION AND CURETTAGE /HYSTEROSCOPY;  Surgeon: Donnamae Jude, MD;  Location: [facility];  Service: Gynecology;  Laterality: N/A;  Requesting 09/03/14 @ 2:00p  . HYSTEROSCOPY W/D&C N/A 08/19/2015   Procedure: DILATATION AND CURETTAGE /HYSTEROSCOPY;  Surgeon: Donnamae Jude, MD;  Location: [facility];  Service: Gynecology;  Laterality: N/A;  .  LITHOTRIPSY    . OTHER SURGICAL HISTORY     Uterine Prolapse/Tacking  . PROLAPSED UTERINE FIBROID LIGATION     in 1960    Family History  Problem Relation Age of Onset  . Diabetes Mother   . Heart disease Mother        MI  . Uterine cancer Mother   . Stroke Father   . Cancer Paternal Grandmother        intestinal  . Stomach cancer Maternal Grandmother   . Colon cancer Neg Hx     Allergies  Allergen Reactions  . Albuterol Rash  . Cefdinir Diarrhea  . Levofloxacin Nausea Only  . Epinephrine Anxiety and Other (See Comments)    Heart palpitations  . Latex Rash    Current Outpatient Prescriptions on File Prior to Visit  Medication Sig Dispense Refill  . acetaminophen (TYLENOL) 325 MG tablet Take 325 mg by mouth every 6 (six) hours as needed for headache (pain).    . calcium citrate-vitamin D (CITRACAL+D) 315-200 MG-UNIT tablet Take 1 tablet by mouth daily with lunch. Reported on 08/09/2015    . Cholecalciferol (D3 MAXIMUM STRENGTH) 5000 units capsule Take 5,000 Units by mouth daily with lunch.     . gabapentin (NEURONTIN) 100 MG capsule TAKE ONE CAPSULE BY MOUTH ONCE DAILY AT BEDTIME 90 capsule 1  . hyoscyamine (LEVSIN, ANASPAZ) 0.125 MG tablet Take 0.125 mg by mouth every 4 (four) hours as needed (ibs/stomach pain).     . Lacosamide 100 MG TABS Take 1 tablet (100 mg total) by mouth 2 (two) times daily. 60 tablet 5  . Liniments (BLUE-EMU SUPER STRENGTH) CREA Apply 1 application topically 3 (three) times daily as needed (pain).     . meclizine (ANTIVERT) 25 MG tablet Take 25 mg by mouth 3 (three) times daily as needed for dizziness (vertigo). Reported on 05/13/2015    . OXcarbazepine (TRILEPTAL) 150 MG tablet Take 150 mg by mouth at bedtime.     . pantoprazole (PROTONIX) 20 MG tablet Take 1 tablet (20 mg total) by mouth 2 (two) times daily. 60 tablet 2  . Polyethyl Glycol-Propyl Glycol (SYSTANE OP) Place 1 drop into both eyes 2 (two) times daily.    . Probiotic Product (DIGESTIVE  ADVANTAGE GUMMIES) CHEW Chew 1 tablet by mouth daily.    Marland Kitchen VIMPAT 50 MG TABS tablet Take 50 mg by mouth 2 (two) times daily.  1   No current facility-administered medications on file prior to visit.     BP 120/76   Pulse 70   Temp 98.5 F (36.9 C) (Oral)   Ht 5' (1.524 m)   Wt 163 lb 12.8 oz (74.3 kg)   SpO2 95%   BMI 31.99 kg/m    Objective:   Physical Exam  Constitutional: She is oriented to  person, place, and time. She appears well-nourished.  Eyes: EOM are normal.  Neck: Neck supple.  Cardiovascular: Normal rate and regular rhythm.   Pulmonary/Chest: Effort normal and breath sounds normal.  Neurological: She is alert and oriented to person, place, and time. No cranial nerve deficit.  Skin: Skin is warm and dry.  Psychiatric: She has a normal mood and affect.          Assessment & Plan:

## 2016-10-04 NOTE — Assessment & Plan Note (Signed)
Well controlled on pantoprazole BID. Continue same.

## 2016-10-05 LAB — TSH: TSH: 1.16 u[IU]/mL (ref 0.35–4.50)

## 2016-10-05 LAB — VITAMIN B12: Vitamin B-12: 326 pg/mL (ref 211–911)

## 2016-10-11 ENCOUNTER — Telehealth: Payer: Self-pay | Admitting: Internal Medicine

## 2016-10-11 MED ORDER — PANTOPRAZOLE SODIUM 20 MG PO TBEC: 20.0000 mg | DELAYED_RELEASE_TABLET | Freq: Two times a day (BID) | ORAL | 2 refills | Status: DC

## 2016-10-11 NOTE — Telephone Encounter (Signed)
I spoke with the pt, she is requesting pantoprazole 20mg  bid #180  She is requesting 90 day supply. she uses [facility] in [place]. Pt has enough for today and tomorrow and she said [facility] will contact her when her prescription is ready.

## 2016-10-11 NOTE — Addendum Note (Signed)
Addended by: Gordy Levan, Liannah Yarbough A on: 10/11/2016 03:26 PM   Modules accepted: Orders

## 2016-10-11 NOTE — Telephone Encounter (Signed)
Pt said that she needs a refill on her pantoprazole 20 mg and instead of 60 pills she is asking for 100 pills. She uses [facility] in [place]

## 2016-10-11 NOTE — Telephone Encounter (Signed)
Please tell the patient the prescription was sent to her pharmacy as requested.

## 2016-10-12 NOTE — Telephone Encounter (Signed)
Pt is aware.  

## 2016-10-18 ENCOUNTER — Telehealth: Payer: Self-pay | Admitting: Primary Care

## 2016-10-18 NOTE — Telephone Encounter (Signed)
Left pt message asking to call Ebony Hail back directly at [phone] to schedule AWV + labs with Katha Cabal and CPE with PCP.  *NOTE* Last AWV 07/07/15

## 2016-10-19 ENCOUNTER — Telehealth: Payer: Self-pay | Admitting: *Deleted

## 2016-10-19 NOTE — Telephone Encounter (Signed)
Faxed printed/signed POC orders back to [facility]. Fax: [phone]. Received confirmation.

## 2016-10-20 NOTE — Telephone Encounter (Signed)
Scheduled 12/13/16

## 2016-11-03 ENCOUNTER — Ambulatory Visit: Payer: Medicare Other | Admitting: Family Medicine

## 2016-11-22 ENCOUNTER — Encounter: Payer: Self-pay | Admitting: Neurology

## 2016-11-23 ENCOUNTER — Telehealth: Payer: Self-pay

## 2016-11-23 NOTE — Telephone Encounter (Signed)
Hello [name] this is Katrina Rn at Dr. Jaynee Eagles office. We are currently prescribing Lacosamide  for you. This medication is very important for you to continue taking. I will cancel your appt on 11/27/2016. Whenever you cancel the appointment we need a reason why you are cancelling. Please continue taking your Lacosamide  Please call our office to reschedule your appt before the end of November. You have to call [phone] to reschedule. Have a good day. ===View-only below this line===   ----- Message -----    From: Shari Vincent. Shari Vincent    Sent: 11/22/2016  7:14 PM EDT      To: Melvenia Beam, MD Subject: Visit Follow-Up Question  Please cancel my appointment for 10/8 we will reschedule. If you have questions, please call my son Shari Vincent at [phone]

## 2016-11-27 ENCOUNTER — Ambulatory Visit: Payer: Medicare Other | Admitting: Neurology

## 2016-11-29 ENCOUNTER — Encounter: Payer: Self-pay | Admitting: Family Medicine

## 2016-11-29 ENCOUNTER — Ambulatory Visit: Payer: Medicare Other | Admitting: Neurology

## 2016-11-29 ENCOUNTER — Ambulatory Visit (INDEPENDENT_AMBULATORY_CARE_PROVIDER_SITE_OTHER): Payer: Medicare Other | Admitting: Family Medicine

## 2016-11-29 DIAGNOSIS — R1900 Intra-abdominal and pelvic swelling, mass and lump, unspecified site: Secondary | ICD-10-CM

## 2016-11-29 DIAGNOSIS — N950 Postmenopausal bleeding: Secondary | ICD-10-CM

## 2016-11-29 DIAGNOSIS — R32 Unspecified urinary incontinence: Secondary | ICD-10-CM | POA: Diagnosis not present

## 2016-11-29 NOTE — Assessment & Plan Note (Signed)
Likely benign--she does not want to pursue w/u at this time.

## 2016-11-29 NOTE — Assessment & Plan Note (Signed)
No further bleeding and patient does not desire further w/u.

## 2016-11-29 NOTE — Progress Notes (Signed)
   Subjective:    Patient ID: Shari Vincent is a 83 y.o. female presenting with Follow-up  on 11/29/2016  HPI: Has been seen for PMB with negative w/u in the past. Also with adnexal mass at 6 cm--MRI shows findings c/w fibroma. No further bleeding. She has chosen no further w/u at this time. She had a CVA with spontaneous brain bleed in 5/18. No residual effects. Walking with a cane now. Has some urinary incontinence and wonders if this fibroma is causing this.  Review of Systems  Constitutional: Negative for chills and fever.  Respiratory: Negative for shortness of breath.   Cardiovascular: Negative for chest pain.  Gastrointestinal: Negative for abdominal pain, nausea and vomiting.  Genitourinary: Negative for dysuria.  Skin: Negative for rash.      Objective:    BP 137/78   Pulse (!) 57   Wt 161 lb (73 kg)   BMI 31.44 kg/m  Physical Exam  Constitutional: She is oriented to person, place, and time. She appears well-developed and well-nourished. No distress.  HENT:  Head: Normocephalic and atraumatic.  Eyes: No scleral icterus.  Neck: Neck supple.  Cardiovascular: Normal rate.   Pulmonary/Chest: Effort normal.  Abdominal: Soft.  Neurological: She is alert and oriented to person, place, and time.  Skin: Skin is warm and dry.  Psychiatric: She has a normal mood and affect.        Assessment & Plan:   Problem List Items Addressed This Visit      Unprioritized   PMB (postmenopausal bleeding)    No further bleeding and patient does not desire further w/u.      Pelvic mass in female    Likely benign--she does not want to pursue w/u at this time.      Urinary incontinence    Not likely to be related to pelvic pathology.         Total face-to-face time with patient: 15 minutes. Over 50% of encounter was spent on counseling and coordination of care. Return if symptoms worsen or fail to improve.  Donnamae Jude 11/29/2016 11:37 AM

## 2016-11-29 NOTE — Assessment & Plan Note (Signed)
Not likely to be related to pelvic pathology.

## 2016-12-04 ENCOUNTER — Ambulatory Visit: Payer: Medicare Other | Admitting: Neurology

## 2016-12-13 ENCOUNTER — Ambulatory Visit (INDEPENDENT_AMBULATORY_CARE_PROVIDER_SITE_OTHER): Payer: Medicare Other

## 2016-12-13 ENCOUNTER — Ambulatory Visit: Payer: Medicare Other

## 2016-12-13 ENCOUNTER — Encounter: Payer: Self-pay | Admitting: Primary Care

## 2016-12-13 ENCOUNTER — Ambulatory Visit (INDEPENDENT_AMBULATORY_CARE_PROVIDER_SITE_OTHER): Payer: Medicare Other | Admitting: Primary Care

## 2016-12-13 VITALS — BP 120/82 | HR 78 | Temp 98.1°F | Ht 60.0 in | Wt 160.4 lb

## 2016-12-13 DIAGNOSIS — K580 Irritable bowel syndrome with diarrhea: Secondary | ICD-10-CM | POA: Diagnosis not present

## 2016-12-13 DIAGNOSIS — R42 Dizziness and giddiness: Secondary | ICD-10-CM

## 2016-12-13 DIAGNOSIS — E785 Hyperlipidemia, unspecified: Secondary | ICD-10-CM | POA: Diagnosis not present

## 2016-12-13 DIAGNOSIS — I878 Other specified disorders of veins: Secondary | ICD-10-CM

## 2016-12-13 DIAGNOSIS — G500 Trigeminal neuralgia: Secondary | ICD-10-CM | POA: Diagnosis not present

## 2016-12-13 DIAGNOSIS — Z0000 Encounter for general adult medical examination without abnormal findings: Secondary | ICD-10-CM

## 2016-12-13 DIAGNOSIS — M1710 Unilateral primary osteoarthritis, unspecified knee: Secondary | ICD-10-CM

## 2016-12-13 DIAGNOSIS — K219 Gastro-esophageal reflux disease without esophagitis: Secondary | ICD-10-CM

## 2016-12-13 DIAGNOSIS — M179 Osteoarthritis of knee, unspecified: Secondary | ICD-10-CM

## 2016-12-13 DIAGNOSIS — N950 Postmenopausal bleeding: Secondary | ICD-10-CM

## 2016-12-13 DIAGNOSIS — Z1231 Encounter for screening mammogram for malignant neoplasm of breast: Secondary | ICD-10-CM

## 2016-12-13 DIAGNOSIS — R29898 Other symptoms and signs involving the musculoskeletal system: Secondary | ICD-10-CM

## 2016-12-13 DIAGNOSIS — Z1239 Encounter for other screening for malignant neoplasm of breast: Secondary | ICD-10-CM

## 2016-12-13 DIAGNOSIS — G8929 Other chronic pain: Secondary | ICD-10-CM

## 2016-12-13 DIAGNOSIS — M5441 Lumbago with sciatica, right side: Secondary | ICD-10-CM

## 2016-12-13 DIAGNOSIS — R7303 Prediabetes: Secondary | ICD-10-CM

## 2016-12-13 NOTE — Patient Instructions (Signed)
Ms. Doepke , Thank you for taking time to come for your Medicare Wellness Visit. I appreciate your ongoing commitment to your health goals. Please review the following plan we discussed and let me know if I can assist you in the future.   These are the goals we discussed: Goals    . Increase water intake          Starting 12/13/2016, I will attempt to drink at least 24 oz of water daily.        This is a list of the screening recommended for you and due dates:  Health Maintenance  Topic Date Due  . Flu Shot  05/20/2017*  . Pneumonia vaccines (2 of 2 - PPSV23) 05/20/2017*  . Tetanus Vaccine  08/09/2022  . DEXA scan (bone density measurement)  Completed  *Topic was postponed. The date shown is not the original due date.   Preventive Care for Adults  A healthy lifestyle and preventive care can promote health and wellness. Preventive health guidelines for adults include the following key practices.  . A routine yearly physical is a good way to check with your health care provider about your health and preventive screening. It is a chance to share any concerns and updates on your health and to receive a thorough exam.  . Visit your dentist for a routine exam and preventive care every 6 months. Brush your teeth twice a day and floss once a day. Good oral hygiene prevents tooth decay and gum disease.  . The frequency of eye exams is based on your age, health, family medical history, use  of contact lenses, and other factors. Follow your health care provider's recommendations for frequency of eye exams.  . Eat a healthy diet. Foods like vegetables, fruits, whole grains, low-fat dairy products, and lean protein foods contain the nutrients you need without too many calories. Decrease your intake of foods high in solid fats, added sugars, and salt. Eat the right amount of calories for you. Get information about a proper diet from your health care provider, if necessary.  . Regular physical  exercise is one of the most important things you can do for your health. Most adults should get at least 150 minutes of moderate-intensity exercise (any activity that increases your heart rate and causes you to sweat) each week. In addition, most adults need muscle-strengthening exercises on 2 or more days a week.  Silver Sneakers may be a benefit available to you. To determine eligibility, you may visit the website: www.silversneakers.com or contact program at 612-076-4339 Mon-Fri between 8AM-8PM.   . Maintain a healthy weight. The body mass index (BMI) is a screening tool to identify possible weight problems. It provides an estimate of body fat based on height and weight. Your health care provider can find your BMI and can help you achieve or maintain a healthy weight.   For adults 20 years and older: ? A BMI below 18.5 is considered underweight. ? A BMI of 18.5 to 24.9 is normal. ? A BMI of 25 to 29.9 is considered overweight. ? A BMI of 30 and above is considered obese.   . Maintain normal blood lipids and cholesterol levels by exercising and minimizing your intake of saturated fat. Eat a balanced diet with plenty of fruit and vegetables. Blood tests for lipids and cholesterol should begin at age 55 and be repeated every 5 years. If your lipid or cholesterol levels are high, you are over 50, or you are at high risk  for heart disease, you may need your cholesterol levels checked more frequently. Ongoing high lipid and cholesterol levels should be treated with medicines if diet and exercise are not working.  . If you smoke, find out from your health care provider how to quit. If you do not use tobacco, please do not start.  . If you choose to drink alcohol, please do not consume more than 2 drinks per day. One drink is considered to be 12 ounces (355 mL) of beer, 5 ounces (148 mL) of wine, or 1.5 ounces (44 mL) of liquor.  . If you are 27-42 years old, ask your health care provider if you  should take aspirin to prevent strokes.  . Use sunscreen. Apply sunscreen liberally and repeatedly throughout the day. You should seek shade when your shadow is shorter than you. Protect yourself by wearing long sleeves, pants, a wide-brimmed hat, and sunglasses year round, whenever you are outdoors.  . Once a month, do a whole body skin exam, using a mirror to look at the skin on your back. Tell your health care provider of new moles, moles that have irregular borders, moles that are larger than a pencil eraser, or moles that have changed in shape or color.

## 2016-12-13 NOTE — Progress Notes (Signed)
Subjective:    Patient ID: Shari Vincent, female    DOB: 1931-04-23, 83 y.o.   MRN: [id number]  HPI  Shari Vincent is an 83 year old female who presents today for complete physical. She will have her Raymond with our health advisor today.  Immunizations: -Tetanus: Completed in 2014 -Influenza: Declines -Pneumonia: Completed Prevnar in 2016, endorses she's completed the second vaccination -Shingles: Will get in January   Diet: She endorses a fair diet Breakfast: Boiled eggs, oatmeal, apple sauce  Lunch: Chicken tenders, chicken salad, sandwich, sweet potato Dinner: Chicken, green beans, corn, red beans Snacks: None Desserts: Daily Beverages: Some water, lemonade and sweet tea  Exercise: She does not regularly exercise Eye exam: Completed in 2018 Dental exam: Completes annually Colonoscopy: Completed in 2010 Dexa: Completed in 2018, osteoporosis.  Pap Smear: Completed in 2017 Mammogram: Completed in 2016, due now.    Review of Systems  Constitutional: Negative for unexpected weight change.  HENT: Negative for rhinorrhea.   Respiratory: Negative for cough and shortness of breath.   Cardiovascular: Negative for chest pain.       Compliant to compression hose  Gastrointestinal: Negative for constipation and diarrhea.  Genitourinary: Negative for difficulty urinating and menstrual problem.  Musculoskeletal: Negative for arthralgias and myalgias.       Right lower extremity pain improving.   Skin: Negative for rash.  Allergic/Immunologic: Negative for environmental allergies.  Neurological: Negative for dizziness, numbness and headaches.  Psychiatric/Behavioral:       Denies concerns for anxiety and depression       Past Medical History:  Diagnosis Date  . Acid reflux   . Arthritis    right knee  . Bulging lumbar disc   . Cancer ([place])    Basal Cell on right shoulder  . Colon polyps   . Diverticulitis   . High cholesterol   . Irritable bowel syndrome (IBS)   .  Kidney stone   . Knee pain    right  . Sciatic pain   . Trigeminal neuralgia   . Trigeminal neuralgia of right side of face   . Trigeminal neuralgia syndrome   . Vertigo      Social History   Social History  . Marital status: Married    Spouse name: N/A  . Number of children: N/A  . Years of education: 7   Occupational History  . Not on file.   Social History Main Topics  . Smoking status: Former Smoker    Types: Cigarettes  . Smokeless tobacco: Never Used  . Alcohol use No  . Drug use: No  . Sexual activity: Not Currently    Birth control/ protection: Post-menopausal   Other Topics Concern  . Not on file   Social History Narrative   Married 62 years.   Has three children, three grandchildren, one great grandchildren.   Enjoys going to church, baking, shop.   Caffeine: occasional tea when eating out   Right-handed    Past Surgical History:  Procedure Laterality Date  . BREAST SURGERY     biopsy in 1990   . CATARACT EXTRACTION    . COLONOSCOPY  05/27/2008   VQM:[id number] rectal polyp,s/p bx/sigmoid diverticula. hyperplastic polyps. benign random bx  . ESOPHAGOGASTRODUODENOSCOPY  05/27/2008   TOI:[id number] HH  . EYE SURGERY    . FOOT SURGERY     Left foot, metal plate  . HYSTEROSCOPY W/D&C N/A 09/03/2014   Procedure: DILATATION AND CURETTAGE /HYSTEROSCOPY;  Surgeon: Donnamae Jude, MD;  Location: [facility];  Service: Gynecology;  Laterality: N/A;  Requesting 09/03/14 @ 2:00p  . HYSTEROSCOPY W/D&C N/A 08/19/2015   Procedure: DILATATION AND CURETTAGE /HYSTEROSCOPY;  Surgeon: Donnamae Jude, MD;  Location: [facility];  Service: Gynecology;  Laterality: N/A;  . LITHOTRIPSY    . OTHER SURGICAL HISTORY     Uterine Prolapse/Tacking  . PROLAPSED UTERINE FIBROID LIGATION     in 1960    Family History  Problem Relation Age of Onset  . Diabetes Mother   . Heart disease Mother        MI  . Uterine cancer Mother   . Stroke Father   . Cancer Paternal Grandmother         intestinal  . Stomach cancer Maternal Grandmother   . Colon cancer Neg Hx     Allergies  Allergen Reactions  . Albuterol Rash  . Cefdinir Diarrhea  . Levofloxacin Nausea Only  . Epinephrine Anxiety and Other (See Comments)    Heart palpitations  . Latex Rash    Current Outpatient Prescriptions on File Prior to Visit  Medication Sig Dispense Refill  . acetaminophen (TYLENOL) 325 MG tablet Take 325 mg by mouth every 6 (six) hours as needed for headache (pain).    . calcium citrate-vitamin D (CITRACAL+D) 315-200 MG-UNIT tablet Take 1 tablet by mouth daily with lunch. Reported on 08/09/2015    . Cholecalciferol (D3 MAXIMUM STRENGTH) 5000 units capsule Take 5,000 Units by mouth daily with lunch.     . gabapentin (NEURONTIN) 100 MG capsule TAKE ONE CAPSULE BY MOUTH ONCE DAILY AT BEDTIME 90 capsule 1  . hyoscyamine (LEVSIN, ANASPAZ) 0.125 MG tablet Take 0.125 mg by mouth every 4 (four) hours as needed (ibs/stomach pain).     . Liniments (BLUE-EMU SUPER STRENGTH) CREA Apply 1 application topically 3 (three) times daily as needed (pain).     . meclizine (ANTIVERT) 25 MG tablet Take 25 mg by mouth 3 (three) times daily as needed for dizziness (vertigo). Reported on 05/13/2015    . pantoprazole (PROTONIX) 20 MG tablet Take 1 tablet (20 mg total) by mouth 2 (two) times daily. 180 tablet 2  . Polyethyl Glycol-Propyl Glycol (SYSTANE OP) Place 1 drop into both eyes 2 (two) times daily.    . Probiotic Product (DIGESTIVE ADVANTAGE GUMMIES) CHEW Chew 1 tablet by mouth daily.    Marland Kitchen VIMPAT 50 MG TABS tablet Take 50 mg by mouth 2 (two) times daily.  1   No current facility-administered medications on file prior to visit.     BP 120/82   Pulse 78   Temp 98.1 F (36.7 C) (Oral)   Ht 5' (1.524 m)   Wt 160 lb 6.4 oz (72.8 kg)   SpO2 98%   BMI 31.33 kg/m    Objective:   Physical Exam  Constitutional: She is oriented to person, place, and time. She appears well-nourished.  HENT:  Right Ear:  Tympanic membrane and ear canal normal.  Left Ear: Tympanic membrane and ear canal normal.  Nose: Nose normal.  Mouth/Throat: Oropharynx is clear and moist.  Eyes: Pupils are equal, round, and reactive to light. Conjunctivae and EOM are normal.  Neck: Neck supple. No thyromegaly present.  Cardiovascular: Normal rate and regular rhythm.   No murmur heard. Pulmonary/Chest: Effort normal and breath sounds normal. She has no rales.  Abdominal: Soft. Bowel sounds are normal. There is no tenderness.  Musculoskeletal:  Generalized chronic decrease in ROM to bilateral knees. Ambulates decently without cane.  Lymphadenopathy:    She has no cervical adenopathy.  Neurological: She is alert and oriented to person, place, and time. She has normal reflexes. No cranial nerve deficit.  Skin: Skin is warm and dry. No rash noted.  Psychiatric: She has a normal mood and affect.          Assessment & Plan:

## 2016-12-13 NOTE — Assessment & Plan Note (Signed)
Improved. Following with neurology.

## 2016-12-13 NOTE — Assessment & Plan Note (Signed)
No recent vaginal bleeding.

## 2016-12-13 NOTE — Progress Notes (Signed)
Subjective:   Shari Vincent is a 83 y.o. female who presents for Medicare Annual (Subsequent) preventive examination.  Review of Systems:  N/A Cardiac Risk Factors include: advanced age (>43men, >59 women);dyslipidemia;obesity (BMI >30kg/m2)     Objective:     Vitals: BP 120/82   Pulse 78   Temp 98.1 F (36.7 C) (Oral)   Ht 5' (1.524 m)   Wt 160 lb 6 oz (72.7 kg)   SpO2 98%   BMI 31.32 kg/m   Body mass index is 31.32 kg/m.   Tobacco History  Smoking Status  . Former Smoker  . Types: Cigarettes  Smokeless Tobacco  . Never Used     Counseling given: No   Past Medical History:  Diagnosis Date  . Acid reflux   . Arthritis    right knee  . Bulging lumbar disc   . Cancer ([place])    Basal Cell on right shoulder  . Colon polyps   . Diverticulitis   . High cholesterol   . Irritable bowel syndrome (IBS)   . Kidney stone   . Knee pain    right  . Sciatic pain   . Trigeminal neuralgia   . Trigeminal neuralgia of right side of face   . Trigeminal neuralgia syndrome   . Vertigo    Past Surgical History:  Procedure Laterality Date  . BREAST SURGERY     biopsy in 1990   . CATARACT EXTRACTION    . COLONOSCOPY  05/27/2008   RWE:[id number] rectal polyp,s/p bx/sigmoid diverticula. hyperplastic polyps. benign random bx  . ESOPHAGOGASTRODUODENOSCOPY  05/27/2008   [id number] HH  . EYE SURGERY    . FOOT SURGERY     Left foot, metal plate  . HYSTEROSCOPY W/D&C N/A 09/03/2014   Procedure: DILATATION AND CURETTAGE /HYSTEROSCOPY;  Surgeon: Donnamae Jude, MD;  Location: [facility];  Service: Gynecology;  Laterality: N/A;  Requesting 09/03/14 @ 2:00p  . HYSTEROSCOPY W/D&C N/A 08/19/2015   Procedure: DILATATION AND CURETTAGE /HYSTEROSCOPY;  Surgeon: Donnamae Jude, MD;  Location: [facility];  Service: Gynecology;  Laterality: N/A;  . LITHOTRIPSY    . OTHER SURGICAL HISTORY     Uterine Prolapse/Tacking  . PROLAPSED UTERINE FIBROID LIGATION     in 1960   Family History    Problem Relation Age of Onset  . Diabetes Mother   . Heart disease Mother        MI  . Uterine cancer Mother   . Stroke Father   . Cancer Paternal Grandmother        intestinal  . Stomach cancer Maternal Grandmother   . Colon cancer Neg Hx    History  Sexual Activity  . Sexual activity: Not Currently  . Birth control/ protection: Post-menopausal    Outpatient Encounter Prescriptions as of 12/13/2016  Medication Sig  . acetaminophen (TYLENOL) 325 MG tablet Take 325 mg by mouth every 6 (six) hours as needed for headache (pain).  . calcium citrate-vitamin D (CITRACAL+D) 315-200 MG-UNIT tablet Take 1 tablet by mouth daily with lunch. Reported on 08/09/2015  . Cholecalciferol (D3 MAXIMUM STRENGTH) 5000 units capsule Take 5,000 Units by mouth daily with lunch.   . gabapentin (NEURONTIN) 100 MG capsule TAKE ONE CAPSULE BY MOUTH ONCE DAILY AT BEDTIME  . hyoscyamine (LEVSIN, ANASPAZ) 0.125 MG tablet Take 0.125 mg by mouth every 4 (four) hours as needed (ibs/stomach pain).   . Liniments (BLUE-EMU SUPER STRENGTH) CREA Apply 1 application topically 3 (three) times daily as needed (pain).   Marland Kitchen  meclizine (ANTIVERT) 25 MG tablet Take 25 mg by mouth 3 (three) times daily as needed for dizziness (vertigo). Reported on 05/13/2015  . OXCARBAZEPINE PO Take 75 mg by mouth at bedtime.  . pantoprazole (PROTONIX) 20 MG tablet Take 1 tablet (20 mg total) by mouth 2 (two) times daily.  Vladimir Faster Glycol-Propyl Glycol (SYSTANE OP) Place 1 drop into both eyes 2 (two) times daily.  . Probiotic Product (DIGESTIVE ADVANTAGE GUMMIES) CHEW Chew 1 tablet by mouth daily.  Marland Kitchen VIMPAT 50 MG TABS tablet Take 50 mg by mouth 2 (two) times daily.   No facility-administered encounter medications on file as of 12/13/2016.     Activities of Daily Living In your present state of health, do you have any difficulty performing the following activities: 12/13/2016 06/26/2016  Hearing? N -  Vision? N -  Difficulty concentrating or  making decisions? N -  Walking or climbing stairs? N -  Dressing or bathing? N -  Doing errands, shopping? Y N  Preparing Food and eating ? N -  Using the Toilet? N -  In the past six months, have you accidently leaked urine? Y -  Do you have problems with loss of bowel control? N -  Managing your Medications? N -  Managing your Finances? N -  Housekeeping or managing your Housekeeping? N -  Some recent data might be hidden    Patient Care Team: Pleas Koch, NP as PCP - General (Nurse Practitioner) Daneil Dolin, MD as Consulting Physician (Gastroenterology) Phillips Odor, MD as Consulting Physician (Neurology) Carole Civil, MD as Consulting Physician (Orthopedic Surgery) Curlene Dolphin, MD as Consulting Physician (Diagnostic Radiology) Sharlotte Alamo, DPM as Consulting Physician (Podiatry)    Assessment:     Hearing Screening   125Hz  250Hz  500Hz  1000Hz  2000Hz  3000Hz  4000Hz  6000Hz  8000Hz   Right ear:   40 40 40  0    Left ear:   40 0 40  0    Vision Screening Comments: Last vision exam in Oct 2018 with Dr. Philbert Riser   Exercise Activities and Dietary recommendations Current Exercise Habits: Home exercise routine, Type of exercise: stretching;walking, Time (Minutes): 10, Frequency (Times/Week): 3, Weekly Exercise (Minutes/Week): 30, Intensity: Mild, Exercise limited by: None identified  Goals    . Increase water intake          Starting 12/13/2016, I will attempt to drink at least 24 oz of water daily.       Fall Risk Fall Risk  12/13/2016 07/07/2015 06/17/2014  Falls in the past year? Yes No No  Number falls in past yr: 1 - -  Injury with Fall? Yes - -   Depression Screen PHQ 2/9 Scores 12/13/2016 07/07/2015 06/17/2014  PHQ - 2 Score 0 0 1  PHQ- 9 Score 0 - -     Cognitive Function MMSE - Mini Mental State Exam 12/13/2016  Orientation to time 5  Orientation to Place 5  Registration 3  Attention/ Calculation 0  Recall 3  Language- name 2 objects 0    Language- repeat 1  Language- follow 3 step command 3  Language- read & follow direction 0  Write a sentence 0  Copy design 0  Total score 20     PLEASE NOTE: A Mini-Cog screen was completed. Maximum score is 20. A value of 0 denotes this part of Folstein MMSE was not completed or the patient failed this part of the Mini-Cog screening.   Mini-Cog Screening Orientation to Time - Max 5  pts Orientation to Place - Max 5 pts Registration - Max 3 pts Recall - Max 3 pts Language Repeat - Max 1 pts Language Follow 3 Step Command - Max 3 pts     Immunization History  Administered Date(s) Administered  . Pneumococcal Conjugate-13 10/22/2014  . Tdap 08/08/2012   Screening Tests Health Maintenance  Topic Date Due  . INFLUENZA VACCINE  05/20/2017 (Originally 09/20/2016)  . PNA vac Low Risk Adult (2 of 2 - PPSV23) 05/20/2017 (Originally 10/22/2015)  . TETANUS/TDAP  08/09/2022  . DEXA SCAN  Completed      Plan:   I have personally reviewed, addressed, and noted the following in the patient's chart:  A. Medical and social history B. Use of alcohol, tobacco or illicit drugs  C. Current medications and supplements D. Functional ability and status E.  Nutritional status F.  Physical activity G. Advance directives H. List of other physicians I.  Hospitalizations, surgeries, and ER visits in previous 12 months J.  [place] to include hearing, vision, cognitive, depression L. Referrals and appointments - none  In addition, I have reviewed and discussed with patient certain preventive protocols, quality metrics, and best practice recommendations. A written personalized care plan for preventive services as well as general preventive health recommendations were provided to patient.  See attached scanned questionnaire for additional information.   Signed,   Lindell Noe, MHA, BS, LPN Health Coach

## 2016-12-13 NOTE — Assessment & Plan Note (Signed)
Stable, following with GI.  Continue hyoscyamine as needed, uses infrequently.  Continue probiotics.

## 2016-12-13 NOTE — Patient Instructions (Signed)
Schedule a lab only appointment to return in early December for fasting labs. Do not eat 4 hours prior, you may have water and black coffee.  Call the [facility] to schedule your mammogram.  Continue calcium with vitamin D.   It's important to improve your diet by reducing consumption of fried food, processed snack foods, sugary drinks. Increase consumption of fresh vegetables and fruits, whole grains, water.  Ensure you are drinking 64 ounces of water daily.  Use your walker when feeling unstable on your feet.  Follow up in 1 year for your annual exam or sooner if needed.  It was a pleasure to see you today!

## 2016-12-13 NOTE — Assessment & Plan Note (Signed)
Occasional use of meclizine for vertigo.  Recommended use of walker in the home when unsteady, uses cane mostly.

## 2016-12-13 NOTE — Progress Notes (Signed)
Pre visit review using our clinic review tool, if applicable. No additional management support is needed unless otherwise documented below in the visit note. 

## 2016-12-13 NOTE — Assessment & Plan Note (Addendum)
Doing well with morning stretches.  Continue gabapentin once nightly.

## 2016-12-13 NOTE — Assessment & Plan Note (Signed)
Wearing compression stockings

## 2016-12-13 NOTE — Assessment & Plan Note (Addendum)
Reduction in oxcarbazepine to 75 mg and Vimpat continued at 50 mg BID. Following with neurology.

## 2016-12-13 NOTE — Assessment & Plan Note (Signed)
Repeat bone density scan stable, continue calcium with vitamin D. Repeat in 1-2 years.

## 2016-12-13 NOTE — Progress Notes (Signed)
PCP notes:   Health maintenance:  Flu vaccine - pt declined PPSV23 - pt states she received in 2017; son will confirm this with pharmacy  Abnormal screenings:   Fall risk - hx of fall with injury Hearing - failed  Hearing Screening   125Hz  250Hz  500Hz  1000Hz  2000Hz  3000Hz  4000Hz  6000Hz  8000Hz   Right ear:   40 40 40  0    Left ear:   40 0 40  0      Patient concerns:   None  Nurse concerns:  None  Next PCP appt:   N/A; CPE prior to AWV

## 2016-12-13 NOTE — Assessment & Plan Note (Signed)
Stable, has not seen orthopedics recently.

## 2016-12-13 NOTE — Assessment & Plan Note (Signed)
Currently managed on pantoprazole 20 mg BID, doing well on this dose.

## 2016-12-13 NOTE — Assessment & Plan Note (Signed)
Due to for repeat lipids in December 2018.

## 2016-12-13 NOTE — Assessment & Plan Note (Signed)
Declines influenza vaccination. Will be getting Shingrix early next year. Pneumonia vaccinations UTD. Colonoscopy UTD. Pap smear UTD. Mammogram due, order placed. Bone density UTD, repeat in 1-2 years. Recommended to improve diet and start regular exercise. Exam overall unremarkable. Labs pending. Follow up in 1 year.

## 2016-12-14 ENCOUNTER — Other Ambulatory Visit: Payer: Self-pay | Admitting: Primary Care

## 2016-12-14 NOTE — Progress Notes (Signed)
I reviewed health advisor's note, was available for consultation, and agree with documentation and plan.  

## 2017-01-03 ENCOUNTER — Ambulatory Visit
Admission: RE | Admit: 2017-01-03 | Discharge: 2017-01-03 | Disposition: A | Discharge status: Home / Self Care | Payer: Medicare Other | Source: Ambulatory Visit | Attending: Primary Care | Admitting: Primary Care

## 2017-01-03 DIAGNOSIS — Z1239 Encounter for other screening for malignant neoplasm of breast: Secondary | ICD-10-CM

## 2017-01-04 ENCOUNTER — Other Ambulatory Visit: Payer: Self-pay | Admitting: Primary Care

## 2017-01-04 DIAGNOSIS — R928 Other abnormal and inconclusive findings on diagnostic imaging of breast: Secondary | ICD-10-CM

## 2017-01-05 ENCOUNTER — Other Ambulatory Visit (INDEPENDENT_AMBULATORY_CARE_PROVIDER_SITE_OTHER): Payer: Medicare Other

## 2017-01-05 DIAGNOSIS — E785 Hyperlipidemia, unspecified: Secondary | ICD-10-CM

## 2017-01-05 DIAGNOSIS — R7303 Prediabetes: Secondary | ICD-10-CM | POA: Diagnosis not present

## 2017-01-05 LAB — LIPID PANEL
Cholesterol: 183 mg/dL (ref 0–200)
HDL: 40 mg/dL (ref >39.00)
LDL Cholesterol: 104 mg/dL — ABNORMAL HIGH (ref 0–99)
NonHDL: 142.7
Total CHOL/HDL Ratio: 5
Triglycerides: 196 mg/dL — ABNORMAL HIGH (ref 0.0–149.0)
VLDL: 39.2 mg/dL (ref 0.0–40.0)

## 2017-01-05 LAB — HEMOGLOBIN A1C: Hgb A1c MFr Bld: 5.9 % (ref 4.6–6.5)

## 2017-01-15 ENCOUNTER — Encounter: Payer: Self-pay | Admitting: Neurology

## 2017-01-15 ENCOUNTER — Ambulatory Visit: Payer: Medicare Other | Admitting: Neurology

## 2017-01-15 VITALS — BP 158/70 | HR 68 | Ht 60.0 in | Wt 158.0 lb

## 2017-01-15 DIAGNOSIS — I609 Nontraumatic subarachnoid hemorrhage, unspecified: Secondary | ICD-10-CM

## 2017-01-15 DIAGNOSIS — R200 Anesthesia of skin: Secondary | ICD-10-CM

## 2017-01-15 DIAGNOSIS — R569 Unspecified convulsions: Secondary | ICD-10-CM | POA: Diagnosis not present

## 2017-01-15 NOTE — Patient Instructions (Signed)
CT head EEG Stop the Oxcarbazepine

## 2017-01-15 NOTE — Progress Notes (Signed)
[facility]    Provider:  Dr Jaynee Eagles Referring Provider: Pleas Koch, NP Primary Care Physician:  Pleas Koch, NP  CC:  Follow-up on subarachnoid hemorrhage  Interval history 01/15/2017:  This is an 83 year old female with a history of hyperlipidemia, trigeminal neuralgia, diverticulitis who was transferred to [facility] hospital in early May of this year for left facial droop, left-sided numbness and weakness, secondary to a subarachnoid hemorrhage with a small frontal predominance and left superficial hemosiderosis likely secondary to cerebral amyloid angiopathy.  Given her cerebral amyloid angiopathy recommended avoiding antiplatelets.  Patient was started on Keppra 500 mg twice a day.  She has been on Trileptal for many years for trigeminal neuralgia for which she is asymptomatic at this time we discussed repeating EEG, CT of the head and then possibly trying to discontinue Keppra and Trileptal very slowly.  We also ordered home health for gait and safety evaluation and treatment and risk of falls.   She has been well. She has a new issue some numbness in the left leg. Started a few weeks ago. She was walking around at [employer] and when she walks she has numbness in her leg. In the calf and lower leg, around the leg, better if she sits down, she has sciatica on the right side. She had home therapy which helped with her right leg sciatica. No more episodes of left-sided weakness.    Patient returns today for follow-up.  HPI:  Shari Vincent is a 83 y.o. female here as a referral from Dr. Carlis Abbott for follow-up on subarachnoid hemorrhage. Review of  Records shows: patient was transferred from an outside hospital for evaluation of right-sided subarachnoid hemorrhage in early May of this year. Patient had been having episodes of left-sided numbness and weakness for 2 weeks. This included tingling, weakness, difficulty moving the arm, tingling of the left face, numbness  and tingling of the left arm and the outside of her left leg also with a mild facial droop. A CT was obtained and showed a small amount of subarachnoid hemorrhage involving the right frontal convexity CTA showed no obvious aneurysms and she was transferred to [facility] for further management. NIH stroke scale in the emergency room was 1. She is here with her daughter who provides much information. Patient today says she has some residual numbness. It comes and goes. She had headaches but now have resolved. She had side effects to Keppra and she is on Vimpat now. She lives with her husband. They have lived in the same house for 60+ years and have been married that long too. Symptoms had been waxing and waning for a week before they went to the ED. Her arm went weak, then leg and face numbness and tingling. Some mild aphasia with words. No other focal neurologic deficits, associated symptoms, inciting events or modifiable factors. She has imbalance.   Reviewed notes, labs and imaging from outside physicians, which showed:  Personally reviewed images and agree with the following:  Mr Brain Wo Contrast Mr Angiogram Head Wo Contrast 06/25/2016  1. Trace subarachnoid hemorrhage along the right superior frontal convexity seen in two adjacent sulci. No associated acute infarct or other acute signal abnormality in the region. 2. 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 No other chronic cerebral blood products or superficial siderosis identified. 3. Intracranial MRA appears stable since 2012 with no intracranial stenosis or  aneurysm identified. The entire area of subarachnoid hemorrhage was not covered by MRA but no cerebral AVM is suspected. 4. Chronically advanced and mildly progressed nonspecific cerebral white matter signal changes since 2012. There are also two small areas of cortical encephalomalacia  in the right inferior frontal and right posterior parietal lobe which are new since 2012. Several tiny chronic cerebellar lacunar infarcts are also new.   Reviewed EEG report which was abnormal demonstrating a very mild diffuse slowing of activity but no focal, hemispheric or lateralizing features. No epileptiform activity was recorded.   Review of Systems: Patient complains of symptoms per HPI as well as the following symptoms: memory loss, headache, numbness, weakness, sleepiness, flushing, moles. Pertinent negatives and positives per HPI. All others negative.  Social History   Socioeconomic History  . Marital status: Married    Spouse name: Not on file  . Number of children: Not on file  . Years of education: 25  . Highest education level: Not on file  Social Needs  . Financial resource strain: Not on file  . Food insecurity - worry: Not on file  . Food insecurity - inability: Not on file  . Transportation needs - medical: Not on file  . Transportation needs - non-medical: Not on file  Occupational History  . Not on file  Tobacco Use  . Smoking status: Former Smoker    Types: Cigarettes  . Smokeless tobacco: Never Used  Substance and Sexual Activity  . Alcohol use: No  . Drug use: No  . Sexual activity: Not Currently    Birth control/protection: Post-menopausal  Other Topics Concern  . Not on file  Social History Narrative   Married 62 years.   Has three children, three grandchildren, one great grandchildren.   Enjoys going to church, baking, shop.   Caffeine: occasional tea when eating out   Right-handed    Family History  Problem Relation Age of Onset  . Diabetes Mother   . Heart disease Mother        MI  . Uterine cancer Mother   . Stroke Father   . Cancer Paternal Grandmother        intestinal  . Stomach cancer Maternal Grandmother   . Colon cancer Neg Hx   . Breast cancer Neg Hx     Past Medical History:  Diagnosis Date  . Acid reflux   .  Arthritis    right knee  . Bulging lumbar disc   . Cancer ([place])    Basal Cell on right shoulder  . Colon polyps   . Diverticulitis   . High cholesterol   . Irritable bowel syndrome (IBS)   . Kidney stone   . Knee pain    right  . Sciatic pain   . Trigeminal neuralgia   . Trigeminal neuralgia of right side of face   . Trigeminal neuralgia syndrome   . Vertigo     Past Surgical History:  Procedure Laterality Date  . BREAST BIOPSY Left   . BREAST SURGERY     biopsy in 1990   . CATARACT EXTRACTION    . COLONOSCOPY  05/27/2008   [id number] rectal polyp,s/p bx/sigmoid diverticula. hyperplastic polyps. benign random bx  . ESOPHAGOGASTRODUODENOSCOPY  05/27/2008   IRC:[id number] HH  . EYE SURGERY    . FOOT SURGERY     Left foot, metal plate  . HYSTEROSCOPY W/D&C N/A 09/03/2014   Procedure: DILATATION AND CURETTAGE /HYSTEROSCOPY;  Surgeon: Donnamae Jude, MD;  Location: [facility];  Service: Gynecology;  Laterality: N/A;  Requesting 09/03/14 @ 2:00p  . HYSTEROSCOPY W/D&C N/A 08/19/2015   Procedure: DILATATION AND CURETTAGE /HYSTEROSCOPY;  Surgeon: Donnamae Jude, MD;  Location: [facility];  Service: Gynecology;  Laterality: N/A;  . LITHOTRIPSY    . OTHER SURGICAL HISTORY     Uterine Prolapse/Tacking  . PROLAPSED UTERINE FIBROID LIGATION     in 1960    Current Outpatient Medications  Medication Sig Dispense Refill  . acetaminophen (TYLENOL) 325 MG tablet Take 325 mg by mouth every 6 (six) hours as needed for headache (pain).    . calcium citrate-vitamin D (CITRACAL+D) 315-200 MG-UNIT tablet Take 1 tablet by mouth daily with lunch. Reported on 08/09/2015    . Cholecalciferol (D3 MAXIMUM STRENGTH) 5000 units capsule Take 5,000 Units by mouth daily with lunch.     . gabapentin (NEURONTIN) 100 MG capsule TAKE ONE CAPSULE BY MOUTH ONCE DAILY AT BEDTIME 90 capsule 1  . hyoscyamine (LEVSIN, ANASPAZ) 0.125 MG tablet Take 0.125 mg by mouth every 4 (four) hours as needed (ibs/stomach pain).       . Liniments (BLUE-EMU SUPER STRENGTH) CREA Apply 1 application topically 3 (three) times daily as needed (pain).     . meclizine (ANTIVERT) 25 MG tablet Take 25 mg by mouth 3 (three) times daily as needed for dizziness (vertigo). Reported on 05/13/2015    . OXCARBAZEPINE PO Take 75 mg by mouth at bedtime.    . pantoprazole (PROTONIX) 20 MG tablet Take 1 tablet (20 mg total) by mouth 2 (two) times daily. 180 tablet 2  . Polyethyl Glycol-Propyl Glycol (SYSTANE OP) Place 1 drop into both eyes 2 (two) times daily.    . Probiotic Product (DIGESTIVE ADVANTAGE GUMMIES) CHEW Chew 1 tablet by mouth daily.    Marland Kitchen VIMPAT 50 MG TABS tablet Take 50 mg by mouth 2 (two) times daily.  1   No current facility-administered medications for this visit.     Allergies as of 01/15/2017 - Review Complete 12/13/2016  Allergen Reaction Noted  . Albuterol Rash 05/24/2014  . Cefdinir Diarrhea 07/23/2012  . Levofloxacin Nausea Only 10/22/2014  . Epinephrine Anxiety and Other (See Comments) 06/23/2016  . Latex Rash 08/09/2015    Vitals: There were no vitals taken for this visit. Last Weight:  Wt Readings from Last 1 Encounters:  12/13/16 160 lb 6 oz (72.7 kg)   Last Height:   Ht Readings from Last 1 Encounters:  12/13/16 5' (1.524 m)       Cognition:    The patient is oriented to person, place, and time;     recent and remote memory intact;     language fluent;     normal attention, concentration,     fund of knowledge Cranial Nerves:    The pupils are equal, round, and reactive to light. Attempted fundoscopic exam could not visualize due to small pupils. Visual fields are full to finger confrontation. Extraocular movements are intact. Trigeminal sensation is intact and the muscles of mastication are normal. The face is symmetric. The palate elevates in the midline. Hearing intact. Voice is normal. Shoulder shrug is normal. The tongue has normal motion without fasciculations.   Coordination:    No  dysmetria  Gait:    Imbalance, wide based gait, cannot tandem heel or toe  Motor Observation:    No asymmetry, no atrophy, and no involuntary movements noted. Tone:    Normal muscle tone.    Posture:    Posture is normal. normal  erect    Strength: Proximal weakness     Sensation: intact to LT     Reflex Exam:  DTR's:    Deep tendon reflexes in the upper and lower extremities are symmetrical bilaterally.   Toes:    The toes are equivocal bilaterally.   Clonus:    Clonus is absent.       Assessment/Plan:  This is an 83 year old female with a history of hyperlipidemia, trigeminal neuralgia, diverticulitis who was transferred to [facility] in early May of this year for left-sided numbness, weakness paresthesias left facial droop. CT scan showed a subarachnoid hemorrhage. 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 MRA was unremarkable, echocardiogram showed grade 2 diastolic dysfunction otherwise unremarkable, carotid Dopplers 1-39% stenosis involving the right internal carotid artery and the left internal carotid artery, lipid panel showed a total cholesterol of 226 and an LDL of 128 discussed needs follow up with PCP for management, hemoglobin A1c 5.7.   - cerebral amyloid angiopathy recommend avoiding antiplatelets.  - Will order repeat CT head and EEG due to new left LE symptoms to re-evaluate [facility]  - Completed Physical therapy for weakness and imbalance, gait and safety evaluation and home safety evaluation.   - Can stop the Oxcarbazepine, is on low once monthly dosing for Trigeminal Neuropathy however has been asymptomatic for years. If symptoms return, restart. Discussed possibility of seizure after stopping but will continue Vimpat at this time. If CT improved and EEG without epileptiform activity can consider stopping Vimpat however there is still a chance of another  seizure.  -  Will order an eeg and CT and if the blood is resorbed and the EEG does not show any epileptiform activity we can titrate off of the Vimpat and then the Trileptal (has been on Trileptal for years for TN without recurrent symptoms for years) very slowly.  Discussed the risk of repeat seizures that carries significant mortality or morbidity.  Orders Placed This Encounter  Procedures  . CT HEAD WO CONTRAST  . EEG     Sarina Ill, MD  [facility] [address] [place], [place] [address]  Phone [phone] Fax [phone]  A total of 25 minutes was spent face-to-face with this patient. Over half this time was spent on counseling patient on the seizure, SAH, left leg numbness diagnosis and different diagnostic and therapeutic options available.

## 2017-01-16 ENCOUNTER — Telehealth: Payer: Self-pay | Admitting: Neurology

## 2017-01-16 NOTE — Telephone Encounter (Signed)
[name] returning a call regarding CT scan for the patient.

## 2017-01-16 NOTE — Telephone Encounter (Signed)
I spoke to patient son and informed him that [facility] try to reach out to the patient. I gave her son [facility] phone number [phone]. He states he is going to contact them to schedule.

## 2017-01-24 ENCOUNTER — Ambulatory Visit: Payer: Medicare Other | Admitting: Neurology

## 2017-01-24 ENCOUNTER — Ambulatory Visit
Admission: RE | Admit: 2017-01-24 | Discharge: 2017-01-24 | Disposition: A | Discharge status: Home / Self Care | Payer: Medicare Other | Source: Ambulatory Visit | Attending: Neurology | Admitting: Neurology

## 2017-01-24 DIAGNOSIS — R569 Unspecified convulsions: Secondary | ICD-10-CM | POA: Diagnosis not present

## 2017-01-24 DIAGNOSIS — I609 Nontraumatic subarachnoid hemorrhage, unspecified: Secondary | ICD-10-CM

## 2017-01-24 DIAGNOSIS — R200 Anesthesia of skin: Secondary | ICD-10-CM

## 2017-01-24 NOTE — Procedures (Signed)
    History:  Shari Vincent is an 83 year old patient with a history of trigeminal neuralgia and an episode in May 2018 of left facial droop and left-sided numbness and weakness.  The patient was found to have subarachnoid hemorrhage.  She has cerebral amyloid angiopathy.  The patient has had a recent episode of numbness in the left leg.  The patient is being evaluated for this episode.  This is a routine EEG.  No skull defects are noted.  Medications include vitamin D supplementation, gabapentin, hyoscyamine, Antivert, Trileptal, Protonix, and Vimpat.  EEG classification: Normal awake  Description of the recording: The background rhythms of this recording consists of a fairly well modulated medium amplitude alpha rhythm of 8 Hz that is reactive to eye opening and closure. As the record progresses, the patient appears to remain in the waking state throughout the recording. Photic stimulation was performed, resulting in a bilateral and symmetric photic driving response. Hyperventilation was not performed. At no time during the recording does there appear to be evidence of spike or spike wave discharges or evidence of focal slowing. EKG monitor shows no evidence of cardiac rhythm abnormalities with a heart rate of 66.  Impression: This is a normal EEG recording in the waking state. No evidence of ictal or interictal discharges are seen.

## 2017-01-25 ENCOUNTER — Telehealth: Payer: Self-pay | Admitting: *Deleted

## 2017-01-25 NOTE — Telephone Encounter (Signed)
-----   Message from Melvenia Beam, MD sent at 01/25/2017  8:20 AM EST ----- EEG is normal thanks

## 2017-01-25 NOTE — Telephone Encounter (Signed)
Called and LVM (ok per DPR) informing patient that her EEG is normal. I left our office number and asked for a call back if she has any questions.

## 2017-01-26 ENCOUNTER — Telehealth: Payer: Self-pay | Admitting: Neurology

## 2017-01-26 NOTE — Telephone Encounter (Signed)
Dr.Ahern pt wants to know if she should stop the Vimpat medication.

## 2017-01-26 NOTE — Telephone Encounter (Signed)
Patient is calling to discuss if she should continue taking VIMPAT 50 MG TABS tablet.

## 2017-01-27 ENCOUNTER — Other Ambulatory Visit: Payer: Self-pay | Admitting: Neurology

## 2017-01-27 NOTE — Telephone Encounter (Signed)
Pt daughter called in that pt took the last dose of vimpat this morning and asking whether needs new refills. She stated that pt recent CT head improved and EEG normal and she remembered that Dr. Jaynee Eagles is going to discontinue her vimpat.  I looked at the chart that Dr. Jaynee Eagles did plan to taper off vimpat once CT improved and EEG normal. Daughter said last time pt refilled her vimpat only 10 pills instead of 60 pills as prescribed. She is going to see if she can still refill the other 50 pills today in the pharmacy. I told her that is a good idea given the vimpat can not be e-prescribed and needs a hard copy which I can not do it today. In case, she can not refill the rest, I think it is ok for her to discontinue the meds as it is actually the potential plan.   I will send this note to Dr. Jaynee Eagles and she will contact pt regarding further plan of vimpat use. Daughter asking to call her number instead which is [phone].   Rosalin Hawking, MD PhD Stroke Neurology 01/27/2017 10:52 AM

## 2017-01-29 ENCOUNTER — Encounter: Payer: Self-pay | Admitting: Neurology

## 2017-01-29 NOTE — Telephone Encounter (Signed)
Shari Vincent, its fine that she stopped the vimpat since eeg and ct were unremarkable. We can wait a month and then discuss the tegretol thanks

## 2017-01-29 NOTE — Telephone Encounter (Signed)
Shari Vincent, call patient it is fine that she stopped her Vimpat. Will address the Tegretol in about a month. thanks

## 2017-01-30 NOTE — Telephone Encounter (Signed)
I spoke to her daughter [name] and also read the last email nd response.   She is now off of the Vimpat and trileptal and remains on gabapentin (for TN).   I told her that was fine.    She does not have a follow-up, could you or Bethany please let her know when she should have a follow-up (daughtetr prefers after the worse winter weather has passed)

## 2017-01-31 ENCOUNTER — Telehealth: Payer: Self-pay | Admitting: Neurology

## 2017-01-31 NOTE — Telephone Encounter (Signed)
Called patient and LVM (ok per DPR) informing her of results of CT head, EEG (results located below). I also told patient that it is ok for her to come off Vimpat. I have asked for a call back to let us know that she got this message.  If she calls back, please let her also know that we can wait a month and then discuss the tegretol.    Notes recorded by Melvenia Beam, MD on 01/28/2017 at 10:39 AM EST The CT is negative, EEg negative. It is ok for her to come off the Vimpat thanks

## 2017-02-02 NOTE — Telephone Encounter (Signed)
Called patient Shari Vincent asking for call back to make sure she understood the message that was left a couple of days ago.

## 2017-02-05 NOTE — Telephone Encounter (Signed)
Called and spoke with patient. She is aware EEG and CT unremarkable and that it is ok to stop the Vimpat. She also had already stopped the Trileptal (not tegretol) per previous instruction (Dr. Jaynee Eagles aware). Per Dr. Jaynee Eagles no need to f/u unless needed. Patient verbalized understanding and appreciation.

## 2017-02-15 ENCOUNTER — Ambulatory Visit
Admission: RE | Admit: 2017-02-15 | Discharge: 2017-02-15 | Disposition: A | Discharge status: Home / Self Care | Payer: Medicare Other | Source: Ambulatory Visit | Attending: Primary Care | Admitting: Primary Care

## 2017-02-15 DIAGNOSIS — R928 Other abnormal and inconclusive findings on diagnostic imaging of breast: Secondary | ICD-10-CM

## 2017-02-20 ENCOUNTER — Encounter: Payer: Self-pay | Admitting: Neurology

## 2017-02-21 ENCOUNTER — Encounter: Payer: Self-pay | Admitting: Neurology

## 2017-02-21 ENCOUNTER — Other Ambulatory Visit: Payer: Self-pay | Admitting: Neurology

## 2017-02-21 MED ORDER — LACOSAMIDE 100 MG PO TABS: 100.0000 mg | ORAL_TABLET | Freq: Two times a day (BID) | ORAL | 5 refills | Status: DC

## 2017-02-22 NOTE — Telephone Encounter (Signed)
Faxed Vimpat prescription to pt's pharmacy, [facility]. Received a receipt of confirmation.

## 2017-02-27 ENCOUNTER — Encounter: Payer: Self-pay | Admitting: Internal Medicine

## 2017-04-04 ENCOUNTER — Ambulatory Visit: Payer: Medicare Other | Admitting: Gastroenterology

## 2017-04-16 ENCOUNTER — Other Ambulatory Visit: Payer: Self-pay | Admitting: Primary Care

## 2017-04-16 DIAGNOSIS — IMO0001 Reserved for inherently not codable concepts without codable children: Secondary | ICD-10-CM

## 2017-04-18 NOTE — Telephone Encounter (Signed)
Error

## 2017-05-07 ENCOUNTER — Encounter: Payer: Self-pay | Admitting: Primary Care

## 2017-05-07 ENCOUNTER — Ambulatory Visit: Payer: Medicare Other | Admitting: Primary Care

## 2017-05-07 DIAGNOSIS — E785 Hyperlipidemia, unspecified: Secondary | ICD-10-CM

## 2017-05-07 DIAGNOSIS — K219 Gastro-esophageal reflux disease without esophagitis: Secondary | ICD-10-CM | POA: Diagnosis not present

## 2017-05-07 DIAGNOSIS — K580 Irritable bowel syndrome with diarrhea: Secondary | ICD-10-CM | POA: Diagnosis not present

## 2017-05-07 DIAGNOSIS — M179 Osteoarthritis of knee, unspecified: Secondary | ICD-10-CM

## 2017-05-07 DIAGNOSIS — M1710 Unilateral primary osteoarthritis, unspecified knee: Secondary | ICD-10-CM

## 2017-05-07 DIAGNOSIS — I680 Cerebral amyloid angiopathy: Secondary | ICD-10-CM

## 2017-05-07 NOTE — Progress Notes (Signed)
Subjective:    Patient ID: Shari Vincent, female    DOB: [birth year], 84 y.o.   MRN: [id number]  HPI  Shari Vincent is a 84 year old female who presents today for follow up.  1) Hyperlipidemia: Last lipid panel in November 2018 with LDL of 104, TC of 183, Trigs 196. She is currently not managed on medication for her lipids. History of TIA last year.   2) GERD/IBS: Currently managed on Levsin, Miralax, Probiotics, pantoprazole 20 BID mg. Using her Levsin infrequently, last use was 6-7 months. Currently following with Dr. Gala Romney through GI.  3) Osteoarthritis: Previously with orthopedics. Does daily stretching and exercising of her legs and is doing well overall. She's using Blu Emu daily with improvement in pain. Currently managed on gabapentin 100 mg HS.   4) Osteoporosis: Currently managed on calcium and vitamin D.  5) Cerebral Amyloid Angiopathy:  Currently following with Neurology and is managed on Vimpat 100 mg. History of TIA in 2018. She does feel unsteady with her gait since starting Vimpat, uses her cane without difficulty.  Review of Systems  Eyes: Negative for visual disturbance.  Respiratory: Negative for shortness of breath.   Cardiovascular: Negative for chest pain.  Gastrointestinal:       Denies GERD symptoms  Neurological: Negative for dizziness, speech difficulty and headaches.       Past Medical History:  Diagnosis Date  . Acid reflux   . Arthritis    right knee  . Bulging lumbar disc   . Cancer ([place])    Basal Cell on right shoulder  . Colon polyps   . Diverticulitis   . High cholesterol   . Irritable bowel syndrome (IBS)   . Kidney stone   . Knee pain    right  . Sciatic pain   . Subarachnoid hemorrhage ([place])   . Trigeminal neuralgia   . Trigeminal neuralgia of right side of face   . Trigeminal neuralgia syndrome   . Vertigo      Social History   Socioeconomic History  . Marital status: Married    Spouse name: Not on file  . Number of  children: Not on file  . Years of education: 49  . Highest education level: Not on file  Social Needs  . Financial resource strain: Not on file  . Food insecurity - worry: Not on file  . Food insecurity - inability: Not on file  . Transportation needs - medical: Not on file  . Transportation needs - non-medical: Not on file  Occupational History  . Not on file  Tobacco Use  . Smoking status: Former Smoker    Types: Cigarettes  . Smokeless tobacco: Never Used  Substance and Sexual Activity  . Alcohol use: No  . Drug use: No  . Sexual activity: Not Currently    Birth control/protection: Post-menopausal  Other Topics Concern  . Not on file  Social History Narrative   Married 62 years.   Has three children, three grandchildren, one great grandchildren.   Enjoys going to church, baking, shop.   Caffeine: occasional tea when eating out   Right-handed    Past Surgical History:  Procedure Laterality Date  . BREAST BIOPSY Left   . BREAST SURGERY     biopsy in 1990   . CATARACT EXTRACTION    . COLONOSCOPY  05/27/2008   [id number] rectal polyp,s/p bx/sigmoid diverticula. hyperplastic polyps. benign random bx  . ESOPHAGOGASTRODUODENOSCOPY  05/27/2008   GHW:[id number]/IRCVE [facility]  . EYE  SURGERY    . FOOT SURGERY     Left foot, metal plate  . HYSTEROSCOPY W/D&C N/A 09/03/2014   Procedure: DILATATION AND CURETTAGE /HYSTEROSCOPY;  Surgeon: Donnamae Jude, MD;  Location: [facility];  Service: Gynecology;  Laterality: N/A;  Requesting 09/03/14 @ 2:00p  . HYSTEROSCOPY W/D&C N/A 08/19/2015   Procedure: DILATATION AND CURETTAGE /HYSTEROSCOPY;  Surgeon: Donnamae Jude, MD;  Location: [facility];  Service: Gynecology;  Laterality: N/A;  . LITHOTRIPSY    . OTHER SURGICAL HISTORY     Uterine Prolapse/Tacking  . PROLAPSED UTERINE FIBROID LIGATION     in 1960    Family History  Problem Relation Age of Onset  . Diabetes Mother   . Heart disease Mother        MI  . Uterine cancer Mother   . Stroke  Father   . Cancer Paternal Grandmother        intestinal  . Stomach cancer Maternal Grandmother   . Colon cancer Neg Hx   . Breast cancer Neg Hx     Allergies  Allergen Reactions  . Albuterol Rash  . Cefdinir Diarrhea  . Levofloxacin Nausea Only  . Epinephrine Anxiety and Other (See Comments)    Heart palpitations  . Latex Rash    Current Outpatient Medications on File Prior to Visit  Medication Sig Dispense Refill  . acetaminophen (TYLENOL) 325 MG tablet Take 325 mg by mouth every 6 (six) hours as needed for headache (pain).    . calcium citrate-vitamin D (CITRACAL+D) 315-200 MG-UNIT tablet Take 1 tablet by mouth daily with lunch. Reported on 08/09/2015    . Cholecalciferol (D3 MAXIMUM STRENGTH) 5000 units capsule Take 5,000 Units by mouth daily with lunch.     . gabapentin (NEURONTIN) 100 MG capsule TAKE 1 CAPSULE BY MOUTH ONCE DAILY AT BEDTIME 90 capsule 1  . hyoscyamine (LEVSIN, ANASPAZ) 0.125 MG tablet Take 0.125 mg by mouth every 4 (four) hours as needed (ibs/stomach pain).     . Lacosamide (VIMPAT) 100 MG TABS Take 1 tablet (100 mg total) by mouth 2 (two) times daily. 60 tablet 5  . Liniments (BLUE-EMU SUPER STRENGTH) CREA Apply 1 application topically 3 (three) times daily as needed (pain).     . meclizine (ANTIVERT) 25 MG tablet Take 25 mg by mouth 3 (three) times daily as needed for dizziness (vertigo). Reported on 05/13/2015    . pantoprazole (PROTONIX) 20 MG tablet Take 1 tablet (20 mg total) by mouth 2 (two) times daily. 180 tablet 2  . Polyethyl Glycol-Propyl Glycol (SYSTANE OP) Place 1 drop into both eyes 2 (two) times daily.    . Probiotic Product (DIGESTIVE ADVANTAGE GUMMIES) CHEW Chew 1 tablet by mouth daily.     No current facility-administered medications on file prior to visit.     BP 122/72   Pulse 60   Temp 98.2 F (36.8 C) (Oral)   Ht 5' (1.524 m)   Wt 154 lb 4 oz (70 kg)   SpO2 99%   BMI 30.12 kg/m    Objective:   Physical Exam  Constitutional:  She appears well-nourished.  Neck: Neck supple.  Cardiovascular: Normal rate and regular rhythm.  Pulmonary/Chest: Effort normal and breath sounds normal.  Skin: Skin is warm and dry.  Psychiatric: She has a normal mood and affect.          Assessment & Plan:

## 2017-05-07 NOTE — Assessment & Plan Note (Signed)
Doing quite well, no recent use of Levsin. Follows with GI.

## 2017-05-07 NOTE — Assessment & Plan Note (Signed)
Doing well on pantoprazole as prescribed. Continue same. Follows with GI.

## 2017-05-07 NOTE — Assessment & Plan Note (Addendum)
Following with Neurology, managed on Vimpat. Repeat lipids this Fall 2019.

## 2017-05-07 NOTE — Assessment & Plan Note (Signed)
Compliant to calcium and vitamin D, continue same. Repeat bone density due in 2020. She has kindly refused bisphosphonate treatment in the past.

## 2017-05-07 NOTE — Assessment & Plan Note (Signed)
Doing well with daily stretching and exercise, continue same.

## 2017-05-07 NOTE — Patient Instructions (Signed)
You are due for your physical and Medicare Wellness Visit in October this year. Please schedule this at your convenience.  It was a pleasure to see you today!

## 2017-05-07 NOTE — Assessment & Plan Note (Signed)
Repeat lipids due this Fall, overall stable. Has refused statin therapy during prior visits.

## 2017-07-10 ENCOUNTER — Telehealth: Payer: Self-pay

## 2017-07-10 NOTE — Telephone Encounter (Signed)
Refill request received from [facility] [place], [place] for Hyoscyamine 0.125 mg.

## 2017-07-12 ENCOUNTER — Telehealth: Payer: Self-pay | Admitting: Internal Medicine

## 2017-07-12 NOTE — Telephone Encounter (Signed)
Refilled by fax

## 2017-07-12 NOTE — Telephone Encounter (Signed)
2nd request for medication. See previous note in the refill box.

## 2017-07-12 NOTE — Telephone Encounter (Signed)
Pt had called Tuesday asking about her refill. The pharmacy still hasn't received anything and she is out of her medicine.

## 2017-07-23 ENCOUNTER — Encounter: Payer: Self-pay | Admitting: Gastroenterology

## 2017-07-23 ENCOUNTER — Ambulatory Visit: Payer: Medicare Other | Admitting: Gastroenterology

## 2017-07-23 VITALS — BP 125/73 | HR 59 | Temp 97.1°F | Ht 59.0 in | Wt 155.2 lb

## 2017-07-23 DIAGNOSIS — K589 Irritable bowel syndrome without diarrhea: Secondary | ICD-10-CM

## 2017-07-23 DIAGNOSIS — K219 Gastro-esophageal reflux disease without esophagitis: Secondary | ICD-10-CM

## 2017-07-23 NOTE — Progress Notes (Signed)
Primary Care Physician: Pleas Koch, NP  Primary Gastroenterologist:  Garfield Cornea, MD   Chief Complaint  Patient presents with  . Gastroesophageal Reflux    HPI: Shari Vincent is a 84 y.o. female here for follow-up of GERD.  She was last seen in August 2018. Also with lifelong intermittent abdominal cramps with diarrhea.   When she feels symptoms come on, start as a cramping pain. If takes levsin right away it will help. Often will halt the episode. If episode progresses, pain in the belly gets so bad she feels like going to pass out. She will lay down in the bathroom floor until passes. Pain feels worse than labor pains. Can go extended periods of time without symptoms but lately more frequent. Ate strawberries the night before several episodes. If she eats too many sweets or cantelope she has symptoms as well. Does not tolerate dairy. Two episodes in the past 4-6 weeks. Lately she has tried Levsin more regularly, about 3 per day but she developed extreme dry mouth and had to stop. No melena, brbpr.   Current Outpatient Medications  Medication Sig Dispense Refill  . acetaminophen (TYLENOL) 325 MG tablet Take 325 mg by mouth every 6 (six) hours as needed for headache (pain).    . calcium citrate-vitamin D (CITRACAL+D) 315-200 MG-UNIT tablet Take 1 tablet by mouth daily with lunch. Reported on 08/09/2015    . Cholecalciferol (D3 MAXIMUM STRENGTH) 5000 units capsule Take 5,000 Units by mouth daily with lunch.     . gabapentin (NEURONTIN) 100 MG capsule TAKE 1 CAPSULE BY MOUTH ONCE DAILY AT BEDTIME 90 capsule 1  . hyoscyamine (LEVSIN, ANASPAZ) 0.125 MG tablet Take 0.125 mg by mouth every 4 (four) hours as needed (ibs/stomach pain).     . Lacosamide (VIMPAT) 100 MG TABS Take 1 tablet (100 mg total) by mouth 2 (two) times daily. 60 tablet 5  . Liniments (BLUE-EMU SUPER STRENGTH) CREA Apply 1 application topically 3 (three) times daily as needed (pain).     . meclizine (ANTIVERT)  25 MG tablet Take 25 mg by mouth 3 (three) times daily as needed for dizziness (vertigo). Reported on 05/13/2015    . pantoprazole (PROTONIX) 20 MG tablet Take 1 tablet (20 mg total) by mouth 2 (two) times daily. 180 tablet 2  . Polyethyl Glycol-Propyl Glycol (SYSTANE OP) Place 1 drop into both eyes 2 (two) times daily.    . Probiotic Product (DIGESTIVE ADVANTAGE GUMMIES) CHEW Chew 1 tablet by mouth daily.     No current facility-administered medications for this visit.     Allergies as of 07/23/2017 - Review Complete 07/23/2017  Allergen Reaction Noted  . Albuterol Rash 05/24/2014  . Cefdinir Diarrhea 07/23/2012  . Levofloxacin Nausea Only 10/22/2014  . Epinephrine Anxiety and Other (See Comments) 06/23/2016  . Latex Rash 08/09/2015    ROS:  General: Negative for anorexia, weight loss, fever, chills, fatigue, weakness. ENT: Negative for hoarseness, difficulty swallowing , nasal congestion. CV: Negative for chest pain, angina, palpitations, dyspnea on exertion, peripheral edema.  Respiratory: Negative for dyspnea at rest, dyspnea on exertion, cough, sputum, wheezing.  GI: See history of present illness. GU:  Negative for dysuria, hematuria, urinary incontinence, urinary frequency, nocturnal urination.  Endo: Negative for unusual weight change.    Physical Examination:   BP 125/73   Pulse (!) 59   Temp (!) 97.1 F (36.2 C) (Oral)   Ht 4\' 11"  (1.499 m)   Wt 155 lb 3.2  oz (70.4 kg)   BMI 31.35 kg/m   General: Well-nourished, well-developed in no acute distress.  Eyes: No icterus. Mouth: Oropharyngeal mucosa moist and pink , no lesions erythema or exudate. Lungs: Clear to auscultation bilaterally.  Heart: Regular rate and rhythm, no murmurs rubs or gallops.  Abdomen: Bowel sounds are normal, nontender, nondistended, no hepatosplenomegaly or masses, no abdominal bruits or hernia , no rebound or guarding.   Extremities: No lower extremity edema. No clubbing or  deformities. Neuro: Alert and oriented x 4   Skin: Warm and dry, no jaundice.   Psych: Alert and cooperative, normal mood and affect.    Impression/Plan:  84 y/o female with chronic GERD reasonably well controlled. Lately having more frequent episodes of abd cramping, worse with certain foods, associated with loose stools. Intermittently with hard stools. Did not tolerate Levsin three times a day due to dry mouth. Levsin helps halt episodes if she takes it in time. She is interested in taking daily to try and prevent. Will see if she tolerates one dose daily, take at 10am. Keep food journal to see if episodes triggers by certain foods. Can try colace if needed for hard stool. Return to the office in 6 months or sooner if needed.

## 2017-07-23 NOTE — Patient Instructions (Signed)
1. Try taking Colace (docusate sodium) 100mg  one to two daily when you need a stool softener.  2. Take hyoscyamine once daily at 10am every day.  3. When you feel your abdominal cramping start, take hyoscyamine and dissolve under your tongue.  4. Keep a food journal every day and document your episodes of abdominal pain/loose stools.  Consider keeping a journal until your next episode and drop off at the office for review. 5. Return to the office in 6 months or call sooner if needed.

## 2017-08-22 ENCOUNTER — Other Ambulatory Visit: Payer: Self-pay | Admitting: Nurse Practitioner

## 2017-09-07 ENCOUNTER — Other Ambulatory Visit: Payer: Self-pay | Admitting: Neurology

## 2017-09-10 NOTE — Telephone Encounter (Signed)
Faxed Lacosamide prescription to pharmacy. Received a receipt of confirmation.

## 2017-09-20 DIAGNOSIS — I639 Cerebral infarction, unspecified: Secondary | ICD-10-CM

## 2017-09-20 HISTORY — PX: TOOTH EXTRACTION: SUR596

## 2017-09-20 HISTORY — DX: Cerebral infarction, unspecified: I63.9

## 2017-09-27 ENCOUNTER — Observation Stay (HOSPITAL_COMMUNITY): Payer: Medicare Other

## 2017-09-27 ENCOUNTER — Encounter (HOSPITAL_COMMUNITY): Payer: Self-pay | Admitting: *Deleted

## 2017-09-27 ENCOUNTER — Observation Stay (HOSPITAL_COMMUNITY)
Admission: EM | Admit: 2017-09-27 | Discharge: 2017-09-29 | Disposition: A | Discharge status: Home / Self Care | Payer: Medicare Other | Attending: Internal Medicine | Admitting: Internal Medicine

## 2017-09-27 ENCOUNTER — Other Ambulatory Visit: Payer: Self-pay

## 2017-09-27 ENCOUNTER — Emergency Department (HOSPITAL_COMMUNITY): Payer: Medicare Other

## 2017-09-27 DIAGNOSIS — I680 Cerebral amyloid angiopathy: Secondary | ICD-10-CM

## 2017-09-27 DIAGNOSIS — Z87820 Personal history of traumatic brain injury: Secondary | ICD-10-CM | POA: Insufficient documentation

## 2017-09-27 DIAGNOSIS — K219 Gastro-esophageal reflux disease without esophagitis: Secondary | ICD-10-CM | POA: Diagnosis present

## 2017-09-27 DIAGNOSIS — G459 Transient cerebral ischemic attack, unspecified: Secondary | ICD-10-CM

## 2017-09-27 DIAGNOSIS — R4182 Altered mental status, unspecified: Secondary | ICD-10-CM | POA: Diagnosis not present

## 2017-09-27 DIAGNOSIS — R531 Weakness: Secondary | ICD-10-CM | POA: Diagnosis not present

## 2017-09-27 DIAGNOSIS — Z87891 Personal history of nicotine dependence: Secondary | ICD-10-CM | POA: Insufficient documentation

## 2017-09-27 DIAGNOSIS — R29898 Other symptoms and signs involving the musculoskeletal system: Principal | ICD-10-CM | POA: Insufficient documentation

## 2017-09-27 DIAGNOSIS — E785 Hyperlipidemia, unspecified: Secondary | ICD-10-CM | POA: Diagnosis not present

## 2017-09-27 DIAGNOSIS — R42 Dizziness and giddiness: Secondary | ICD-10-CM

## 2017-09-27 DIAGNOSIS — Z79899 Other long term (current) drug therapy: Secondary | ICD-10-CM | POA: Insufficient documentation

## 2017-09-27 DIAGNOSIS — Z85828 Personal history of other malignant neoplasm of skin: Secondary | ICD-10-CM | POA: Diagnosis not present

## 2017-09-27 DIAGNOSIS — Z91040 Latex allergy status: Secondary | ICD-10-CM | POA: Insufficient documentation

## 2017-09-27 DIAGNOSIS — I609 Nontraumatic subarachnoid hemorrhage, unspecified: Secondary | ICD-10-CM

## 2017-09-27 DIAGNOSIS — R4701 Aphasia: Secondary | ICD-10-CM | POA: Diagnosis not present

## 2017-09-27 DIAGNOSIS — R200 Anesthesia of skin: Secondary | ICD-10-CM

## 2017-09-27 LAB — I-STAT CHEM 8, ED
BUN: 17 mg/dL (ref 8–23)
Calcium, Ion: 1.09 mmol/L — ABNORMAL LOW (ref 1.15–1.40)
Chloride: 105 mmol/L (ref 98–111)
Creatinine, Ser: 0.9 mg/dL (ref 0.44–1.00)
Glucose, Bld: 82 mg/dL (ref 70–99)
HCT: 41 % (ref 36.0–46.0)
Hemoglobin: 13.9 g/dL (ref 12.0–15.0)
Potassium: 4.8 mmol/L (ref 3.5–5.1)
Sodium: 139 mmol/L (ref 135–145)
TCO2: 27 mmol/L (ref 22–32)

## 2017-09-27 LAB — DIFFERENTIAL
Abs Immature Granulocytes: 0 10*3/uL (ref 0.0–0.1)
Basophils Absolute: 0.1 10*3/uL (ref 0.0–0.1)
Basophils Relative: 1 %
Eosinophils Absolute: 0.2 10*3/uL (ref 0.0–0.7)
Eosinophils Relative: 2 %
Immature Granulocytes: 0 %
Lymphocytes Relative: 33 %
Lymphs Abs: 2.8 10*3/uL (ref 0.7–4.0)
Monocytes Absolute: 0.7 10*3/uL (ref 0.1–1.0)
Monocytes Relative: 9 %
Neutro Abs: 4.6 10*3/uL (ref 1.7–7.7)
Neutrophils Relative %: 55 %

## 2017-09-27 LAB — COMPREHENSIVE METABOLIC PANEL
ALT: 7 U/L (ref 0–44)
AST: 22 U/L (ref 15–41)
Albumin: 4 g/dL (ref 3.5–5.0)
Alkaline Phosphatase: 64 U/L (ref 38–126)
Anion gap: 10 (ref 5–15)
BUN: 12 mg/dL (ref 8–23)
CO2: 25 mmol/L (ref 22–32)
Calcium: 9.5 mg/dL (ref 8.9–10.3)
Chloride: 105 mmol/L (ref 98–111)
Creatinine, Ser: 0.94 mg/dL (ref 0.44–1.00)
GFR calc Af Amer: >60 mL/min (ref >60)
GFR calc non Af Amer: 54 mL/min — ABNORMAL LOW (ref >60)
Glucose, Bld: 91 mg/dL (ref 70–99)
Potassium: 4.3 mmol/L (ref 3.5–5.1)
Sodium: 140 mmol/L (ref 135–145)
Total Bilirubin: 0.6 mg/dL (ref 0.3–1.2)
Total Protein: 7 g/dL (ref 6.5–8.1)

## 2017-09-27 LAB — CBC
HCT: 40.2 % (ref 36.0–46.0)
Hemoglobin: 13.1 g/dL (ref 12.0–15.0)
MCH: 30.3 pg (ref 26.0–34.0)
MCHC: 32.6 g/dL (ref 30.0–36.0)
MCV: 93.1 fL (ref 78.0–100.0)
Platelets: 181 10*3/uL (ref 150–400)
RBC: 4.32 MIL/uL (ref 3.87–5.11)
RDW: 13.1 % (ref 11.5–15.5)
WBC: 8.4 10*3/uL (ref 4.0–10.5)

## 2017-09-27 LAB — PROTIME-INR
INR: 0.92
Prothrombin Time: 12.3 seconds (ref 11.4–15.2)

## 2017-09-27 LAB — MRSA PCR SCREENING: MRSA by PCR: NEGATIVE

## 2017-09-27 LAB — I-STAT TROPONIN, ED: Troponin i, poc: 0.01 ng/mL (ref 0.00–0.08)

## 2017-09-27 LAB — APTT: aPTT: 31 seconds (ref 24–36)

## 2017-09-27 LAB — CBG MONITORING, ED: Glucose-Capillary: 81 mg/dL (ref 70–99)

## 2017-09-27 MED ORDER — VITAMIN D 1000 UNITS PO TABS
5000.0000 [IU] | ORAL_TABLET | Freq: Every day | ORAL | Status: DC
  Administered 2017-09-28: 5000 [IU] via ORAL
  Filled 2017-09-27: qty 5

## 2017-09-27 MED ORDER — DIGESTIVE ADVANTAGE GUMMIES PO CHEW: 1.0000 | CHEWABLE_TABLET | Freq: Every day | ORAL | Status: DC

## 2017-09-27 MED ORDER — HYOSCYAMINE SULFATE 0.125 MG PO TBDP: 0.1250 mg | ORAL_TABLET | ORAL | Status: DC | PRN

## 2017-09-27 MED ORDER — PANTOPRAZOLE SODIUM 20 MG PO TBEC
20.0000 mg | DELAYED_RELEASE_TABLET | Freq: Two times a day (BID) | ORAL | Status: DC
  Administered 2017-09-27 – 2017-09-29 (×4): 20 mg via ORAL
  Filled 2017-09-27 (×4): qty 1

## 2017-09-27 MED ORDER — BLUE-EMU SUPER STRENGTH EX CREA: 1.0000 "application " | TOPICAL_CREAM | Freq: Three times a day (TID) | CUTANEOUS | Status: DC | PRN

## 2017-09-27 MED ORDER — SENNOSIDES-DOCUSATE SODIUM 8.6-50 MG PO TABS: 1.0000 | ORAL_TABLET | Freq: Every evening | ORAL | Status: DC | PRN

## 2017-09-27 MED ORDER — ATORVASTATIN CALCIUM 10 MG PO TABS
10.0000 mg | ORAL_TABLET | Freq: Every day | ORAL | Status: DC
  Administered 2017-09-27: 10 mg via ORAL
  Filled 2017-09-27: qty 1

## 2017-09-27 MED ORDER — IOPAMIDOL (ISOVUE-370) INJECTION 76%
INTRAVENOUS | Status: AC
  Filled 2017-09-27: qty 100

## 2017-09-27 MED ORDER — HYOSCYAMINE SULFATE 0.125 MG SL SUBL
0.1250 mg | SUBLINGUAL_TABLET | SUBLINGUAL | Status: DC | PRN
  Filled 2017-09-27: qty 1

## 2017-09-27 MED ORDER — IOPAMIDOL (ISOVUE-370) INJECTION 76%
100.0000 mL | Freq: Once | INTRAVENOUS | Status: AC | PRN
  Administered 2017-09-27: 100 mL via INTRAVENOUS

## 2017-09-27 MED ORDER — POLYETHYL GLYCOL-PROPYL GLYCOL 0.4-0.3 % OP GEL: Freq: Two times a day (BID) | OPHTHALMIC | Status: DC

## 2017-09-27 MED ORDER — ARTIFICIAL TEARS OPHTHALMIC OINT
TOPICAL_OINTMENT | Freq: Two times a day (BID) | OPHTHALMIC | Status: DC
  Administered 2017-09-27 – 2017-09-29 (×4): via OPHTHALMIC
  Filled 2017-09-27: qty 3.5

## 2017-09-27 MED ORDER — SODIUM CHLORIDE 0.9 % IV SOLN
INTRAVENOUS | Status: AC
  Administered 2017-09-27: 20:00:00 via INTRAVENOUS

## 2017-09-27 MED ORDER — CALCIUM CARBONATE-VITAMIN D 500-200 MG-UNIT PO TABS
1.0000 | ORAL_TABLET | Freq: Every day | ORAL | Status: DC
  Administered 2017-09-28 – 2017-09-29 (×2): 1 via ORAL
  Filled 2017-09-27 (×2): qty 1

## 2017-09-27 MED ORDER — ACETAMINOPHEN 325 MG PO TABS: 325.0000 mg | ORAL_TABLET | Freq: Four times a day (QID) | ORAL | Status: DC | PRN

## 2017-09-27 MED ORDER — ACETAMINOPHEN 160 MG/5ML PO SOLN: 650.0000 mg | ORAL | Status: DC | PRN

## 2017-09-27 MED ORDER — ACETAMINOPHEN 325 MG PO TABS: 650.0000 mg | ORAL_TABLET | ORAL | Status: DC | PRN

## 2017-09-27 MED ORDER — SODIUM CHLORIDE 0.9 % IV BOLUS
1000.0000 mL | Freq: Once | INTRAVENOUS | Status: AC
  Administered 2017-09-27: 1000 mL via INTRAVENOUS

## 2017-09-27 MED ORDER — LACOSAMIDE 50 MG PO TABS
100.0000 mg | ORAL_TABLET | Freq: Two times a day (BID) | ORAL | Status: DC
  Administered 2017-09-27 – 2017-09-29 (×4): 100 mg via ORAL
  Filled 2017-09-27 (×4): qty 2

## 2017-09-27 MED ORDER — STROKE: EARLY STAGES OF RECOVERY BOOK
Freq: Once | Status: DC
  Filled 2017-09-27: qty 1

## 2017-09-27 MED ORDER — CALCIUM CITRATE-VITAMIN D 315-200 MG-UNIT PO TABS: 1.0000 | ORAL_TABLET | Freq: Every day | ORAL | Status: DC

## 2017-09-27 MED ORDER — HYOSCYAMINE SULFATE 0.125 MG PO TABS: 0.1250 mg | ORAL_TABLET | ORAL | Status: DC | PRN

## 2017-09-27 MED ORDER — RISAQUAD PO CAPS
1.0000 | ORAL_CAPSULE | Freq: Every day | ORAL | Status: DC
  Administered 2017-09-28 – 2017-09-29 (×2): 1 via ORAL
  Filled 2017-09-27 (×2): qty 1

## 2017-09-27 MED ORDER — ACETAMINOPHEN 650 MG RE SUPP: 650.0000 mg | RECTAL | Status: DC | PRN

## 2017-09-27 MED ORDER — GABAPENTIN 100 MG PO CAPS
100.0000 mg | ORAL_CAPSULE | Freq: Every day | ORAL | Status: DC
  Administered 2017-09-27 – 2017-09-28 (×2): 100 mg via ORAL
  Filled 2017-09-27 (×2): qty 1

## 2017-09-27 NOTE — ED Notes (Signed)
[facility] contacted to activate Code Stroke 

## 2017-09-27 NOTE — ED Provider Notes (Signed)
[facility] EMERGENCY DEPARTMENT Provider Note   CSN: [id number] Arrival date & time: 09/27/17  1334  History   Chief Complaint Chief Complaint  Patient presents with  . Numbness    HPI Shari Vincent is a 84 y.o. female.  HPI 84 year old female with history of prior subarachnoid hemorrhage, cerebral amyloid angiopathy, and IBS presents to the emergency department today for evaluation of weakness of the right arm.  Is accompanied by her husband and daughter who states that they were shopping today when patient began complaining of right arm numbness at approxi-1 PM.  Was normal prior to this.  When they got to the emergency department patient had resolution of symptoms however approximately 20 minutes later she began complaining of right arm and right leg weakness.  Also quit answering questions and following commands.  Was taken to room and code stroke called.  Patient mumbling when I saw her but would follow commands such as stick out tongue. Would not follow commands or move RUE on command however would occasinoally scratch face with right hand. Per daughter, pt did not take her home meds this morning. No falls or traumas. No fevers or infectious symptoms.   Past Medical History:  Diagnosis Date  . Acid reflux   . Arthritis    right knee  . Bulging lumbar disc   . Cancer ([employer])    Basal Cell on right shoulder  . Colon polyps   . Diverticulitis   . High cholesterol   . Irritable bowel syndrome (IBS)   . Kidney stone   . Knee pain    right  . Sciatic pain   . Subarachnoid hemorrhage ([place])   . Trigeminal neuralgia   . Trigeminal neuralgia of right side of face   . Trigeminal neuralgia syndrome   . Vertigo     Patient Active Problem List   Diagnosis Date Noted  . Preventative health care 12/13/2016  . Urinary incontinence 10/04/2016  . Acute cerebrovascul insuff, transient focal neurologic signs/symptoms 06/27/2016  . Family hx-stroke 06/27/2016  .  Cerebral amyloid angiopathy (CODE) 06/26/2016  . 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 Subarachnoid hemorrhage ([place]) 06/25/2016  . Upper extremity weakness 06/23/2016  . Osteoporosis 10/22/2014  . Pelvic mass in female 08/25/2014  . PMB (postmenopausal bleeding) 08/06/2014  . Medicare annual wellness visit, subsequent 06/17/2014  . IBS (irritable bowel syndrome) 05/26/2014  . Right-sided low back pain with right-sided sciatica 05/26/2014  . Trigeminal neuralgia 05/26/2014  . OA (osteoarthritis) of knee 07/23/2012  . Venous stasis 07/23/2012  . COLONIC POLYPS, HYPERPLASTIC 08/14/2008  . Hyperlipidemia 08/14/2008  . GERD 08/14/2008  . VERTIGO 08/14/2008    Past Surgical History:  Procedure Laterality Date  . BREAST BIOPSY Left   . BREAST SURGERY     biopsy in 1990   . CATARACT EXTRACTION    . COLONOSCOPY  05/27/2008   WSF:KCLEXNTZGY rectal polyp,s/p bx/sigmoid diverticula. hyperplastic polyps. benign random bx  . ESOPHAGOGASTRODUODENOSCOPY  05/27/2008   FVC:BSWHQP/RFFMB HH  . EYE SURGERY    . FOOT SURGERY     Left foot, metal plate  . HYSTEROSCOPY W/D&C N/A 09/03/2014   Procedure: DILATATION AND CURETTAGE /HYSTEROSCOPY;  Surgeon: Donnamae Jude, MD;  Location: [facility];  Service: Gynecology;  Laterality: N/A;  Requesting 09/03/14 @ 2:00p  . HYSTEROSCOPY W/D&C N/A 08/19/2015   Procedure: DILATATION AND CURETTAGE /HYSTEROSCOPY;  Surgeon: Donnamae Jude, MD;  Location: [facility];  Service: Gynecology;  Laterality: N/A;  . LITHOTRIPSY    .  OTHER SURGICAL HISTORY     Uterine Prolapse/Tacking  . PROLAPSED UTERINE FIBROID LIGATION     in 1960     OB History    Gravida  5   Para  3   Term      Preterm  3   AB  2   Living  3     SAB  2   TAB      Ectopic      Multiple      Live Births               Home Medications    Prior to Admission medications   Medication Sig Start Date End Date Taking? Authorizing Provider  acetaminophen (TYLENOL) 325 MG tablet Take 325 mg  by mouth every 6 (six) hours as needed (headaches or pain).    Yes [provider]  calcium citrate-vitamin D (CITRACAL+D) 315-200 MG-UNIT tablet Take 1 tablet by mouth daily with lunch. Reported on 08/09/2015   Yes [provider]  Cholecalciferol (D3 MAXIMUM STRENGTH) 5000 units capsule Take 5,000 Units by mouth daily with lunch.    Yes [provider]  gabapentin (NEURONTIN) 100 MG capsule TAKE 1 CAPSULE BY MOUTH ONCE DAILY AT BEDTIME Patient taking differently: Take 100 mg by mouth at bedtime.  04/16/17  Yes Pleas Koch, NP  hyoscyamine (LEVSIN, ANASPAZ) 0.125 MG tablet Take 0.125 mg by mouth every 4 (four) hours as needed (ibs/stomach pain).    Yes [provider]  Lacosamide (VIMPAT) 100 MG TABS Take 1 tablet (100 mg total) by mouth 2 (two) times daily. Please call for an appt. [phone] Patient taking differently: Take 100 mg by mouth 2 (two) times daily.  09/07/17  Yes Melvenia Beam, MD  Liniments (BLUE-EMU SUPER STRENGTH) CREA Apply 1 application topically 3 (three) times daily as needed (for knee and back pain).    Yes [provider]  meclizine (ANTIVERT) 25 MG tablet Take 25 mg by mouth 3 (three) times daily as needed for dizziness (vertigo). Reported on 05/13/2015   Yes [provider]  pantoprazole (PROTONIX) 20 MG tablet TAKE 1 TABLET BY MOUTH TWICE DAILY 08/22/17  Yes Annitta Needs, NP  Polyethyl Glycol-Propyl Glycol (SYSTANE OP) Place 1 drop into both eyes 2 (two) times daily.   Yes [provider]  Probiotic Product (DIGESTIVE ADVANTAGE GUMMIES) CHEW Chew 1 tablet by mouth daily.   Yes [provider]    Family History Family History  Problem Relation Age of Onset  . Diabetes Mother   . Heart disease Mother        MI  . Uterine cancer Mother   . Stroke Father   . Cancer Paternal Grandmother        intestinal  . Stomach cancer Maternal Grandmother   . Colon cancer Neg Hx   . Breast cancer Neg Hx      Social History Social History   Tobacco Use  . Smoking status: Former Smoker    Types: Cigarettes  . Smokeless tobacco: Never Used  Substance Use Topics  . Alcohol use: No  . Drug use: No     Allergies   Albuterol; Cefdinir; Lactose intolerance (gi); Levofloxacin; Other; Adhesive [tape]; Epinephrine; and Latex   Review of Systems Review of Systems  Unable to perform ROS: Mental status change     Physical Exam Updated Vital Signs BP 140/73   Pulse 79   Temp 99 F (37.2 C)   Resp 17  Ht 5' (1.524 m)   Wt 76 kg   SpO2 98%   BMI 32.72 kg/m   Physical Exam  Constitutional: No distress.  crying  HENT:  Head: Normocephalic and atraumatic.  Eyes: Pupils are equal, round, and reactive to light. Conjunctivae are normal.  Would not track in any direction with eyes  Neck: Neck supple.  Cardiovascular: Regular rhythm and intact distal pulses.  Pulmonary/Chest: Effort normal and breath sounds normal. No respiratory distress.  Abdominal: Soft. She exhibits no distension. There is no tenderness.  Musculoskeletal: She exhibits no edema.  Neurological: She is alert.  Would not reliably follow commands with any extremity though would move toes to painful stimuli, downturning babinski bilat. Moving LUE more than RUE though would occasionlly move all extremities. Unable to adequately assess sensation to light touch given not responding to questions .  Skin: Skin is warm and dry.  Psychiatric: She has a normal mood and affect.  Nursing note and vitals reviewed.    ED Treatments / Results  Labs (all labs ordered are listed, but only abnormal results are displayed) Labs Reviewed  COMPREHENSIVE METABOLIC PANEL - Abnormal; Notable for the following components:      Result Value   GFR calc non Af Amer 54 (*)    All other components within normal limits  I-STAT CHEM 8, ED - Abnormal; Notable for the following components:   Calcium, Ion 1.09 (*)    All other components  within normal limits  PROTIME-INR  APTT  CBC  DIFFERENTIAL  I-STAT TROPONIN, ED  CBG MONITORING, ED    EKG None  Radiology Ct Angio Head W Or Wo Contrast  Result Date: 09/27/2017 CLINICAL DATA:  Left-sided weakness EXAM: CT ANGIOGRAPHY HEAD AND NECK CT PERFUSION BRAIN TECHNIQUE: Multidetector CT imaging of the head and neck was performed using the standard protocol during bolus administration of intravenous contrast. Multiplanar CT image reconstructions and MIPs were obtained to evaluate the vascular anatomy. Carotid stenosis measurements (when applicable) are obtained utilizing NASCET criteria, using the distal internal carotid diameter as the denominator. Multiphase CT imaging of the brain was performed following IV bolus contrast injection. Subsequent parametric perfusion maps were calculated using RAPID software. CONTRAST:  110mL ISOVUE-370 IOPAMIDOL (ISOVUE-370) INJECTION 76% COMPARISON:  Noncontrast head CT from earlier today FINDINGS: CTA NECK FINDINGS Aortic arch: Atherosclerotic plaque. Three vessel branching. No acute finding or dilatation. Right carotid system: Mild for age atheromatous plaque at the common carotid bifurcation. No stenosis, ulceration, or beading. Left carotid system: Mild for age calcified plaque at the common carotid bifurcation without stenosis or ulceration. ICA tortuosity without beading. Vertebral arteries: Proximal subclavian atherosclerosis on the left. No flow limiting stenosis. Left vertebral artery is dominant. Both vertebral arteries are smooth and widely patent to the dura. Skeleton: No acute or aggressive finding. Other neck: Negative Upper chest: Negative Review of the MIP images confirms the above findings CTA HEAD FINDINGS Anterior circulation: Symmetric small appearance of intracranial medium size vessels primarily attributed to bolus timing timing. There is atherosclerotic plaque on the carotid siphons. No major branch occlusion. Equivocal for focal  stenosis at the origin of the upper division left M2 branch. There is early branching of the left MCA compared to the right, which accounts for asymmetric appearance on the MIP images. Negative for aneurysm. Posterior circulation: Dominant left vertebral artery. The distal right vertebral artery is narrow and likely atherosclerotic. Basilar is smooth and diffusely patent. Hypoplastic or aplastic right P1 segment. Symmetric PCA flow without proximal  occlusion. Venous sinuses: Not visualized Anatomic variants: As above Delayed phase: Not obtained in the emergent setting Review of the MIP images confirms the above findings CT Brain Perfusion Findings: CBF (<30%) Volume: 7mL Perfusion (Tmax>6.0s) volume: 13-centered in the left parietal region. These results were called by telephone at the time of interpretation on 09/27/2017 at 3:20 pm to Dr. Roland Rack , who verbally acknowledged these results. IMPRESSION: 1. No evidence of large vessel occlusion. Visualization of the medium and distal intracranial vessels is limited by bolus timing. 2. No infarct by CT perfusion. 13 cc volume of ischemic range delayed perfusion in the left parietal region, vessel size likely below vessel resolution of this CTA. 3. Equivocal focal stenosis at the origin of the upper division left M2 branch. 4. Mild atherosclerosis in the neck without flow limiting stenosis or embolic source. Electronically Signed   By: Monte Fantasia M.D.   On: 09/27/2017 15:26   Ct Angio Neck W Or Wo Contrast  Result Date: 09/27/2017 CLINICAL DATA:  Left-sided weakness EXAM: CT ANGIOGRAPHY HEAD AND NECK CT PERFUSION BRAIN TECHNIQUE: Multidetector CT imaging of the head and neck was performed using the standard protocol during bolus administration of intravenous contrast. Multiplanar CT image reconstructions and MIPs were obtained to evaluate the vascular anatomy. Carotid stenosis measurements (when applicable) are obtained utilizing NASCET criteria, using  the distal internal carotid diameter as the denominator. Multiphase CT imaging of the brain was performed following IV bolus contrast injection. Subsequent parametric perfusion maps were calculated using RAPID software. CONTRAST:  161mL ISOVUE-370 IOPAMIDOL (ISOVUE-370) INJECTION 76% COMPARISON:  Noncontrast head CT from earlier today FINDINGS: CTA NECK FINDINGS Aortic arch: Atherosclerotic plaque. Three vessel branching. No acute finding or dilatation. Right carotid system: Mild for age atheromatous plaque at the common carotid bifurcation. No stenosis, ulceration, or beading. Left carotid system: Mild for age calcified plaque at the common carotid bifurcation without stenosis or ulceration. ICA tortuosity without beading. Vertebral arteries: Proximal subclavian atherosclerosis on the left. No flow limiting stenosis. Left vertebral artery is dominant. Both vertebral arteries are smooth and widely patent to the dura. Skeleton: No acute or aggressive finding. Other neck: Negative Upper chest: Negative Review of the MIP images confirms the above findings CTA HEAD FINDINGS Anterior circulation: Symmetric small appearance of intracranial medium size vessels primarily attributed to bolus timing timing. There is atherosclerotic plaque on the carotid siphons. No major branch occlusion. Equivocal for focal stenosis at the origin of the upper division left M2 branch. There is early branching of the left MCA compared to the right, which accounts for asymmetric appearance on the MIP images. Negative for aneurysm. Posterior circulation: Dominant left vertebral artery. The distal right vertebral artery is narrow and likely atherosclerotic. Basilar is smooth and diffusely patent. Hypoplastic or aplastic right P1 segment. Symmetric PCA flow without proximal occlusion. Venous sinuses: Not visualized Anatomic variants: As above Delayed phase: Not obtained in the emergent setting Review of the MIP images confirms the above findings  CT Brain Perfusion Findings: CBF (<30%) Volume: 55mL Perfusion (Tmax>6.0s) volume: 13-centered in the left parietal region. These results were called by telephone at the time of interpretation on 09/27/2017 at 3:20 pm to Dr. Roland Rack , who verbally acknowledged these results. IMPRESSION: 1. No evidence of large vessel occlusion. Visualization of the medium and distal intracranial vessels is limited by bolus timing. 2. No infarct by CT perfusion. 13 cc volume of ischemic range delayed perfusion in the left parietal region, vessel size likely below vessel resolution  of this CTA. 3. Equivocal focal stenosis at the origin of the upper division left M2 branch. 4. Mild atherosclerosis in the neck without flow limiting stenosis or embolic source. Electronically Signed   By: Monte Fantasia M.D.   On: 09/27/2017 15:26   Ct Cerebral Perfusion W Contrast  Result Date: 09/27/2017 CLINICAL DATA:  Left-sided weakness EXAM: CT ANGIOGRAPHY HEAD AND NECK CT PERFUSION BRAIN TECHNIQUE: Multidetector CT imaging of the head and neck was performed using the standard protocol during bolus administration of intravenous contrast. Multiplanar CT image reconstructions and MIPs were obtained to evaluate the vascular anatomy. Carotid stenosis measurements (when applicable) are obtained utilizing NASCET criteria, using the distal internal carotid diameter as the denominator. Multiphase CT imaging of the brain was performed following IV bolus contrast injection. Subsequent parametric perfusion maps were calculated using RAPID software. CONTRAST:  110mL ISOVUE-370 IOPAMIDOL (ISOVUE-370) INJECTION 76% COMPARISON:  Noncontrast head CT from earlier today FINDINGS: CTA NECK FINDINGS Aortic arch: Atherosclerotic plaque. Three vessel branching. No acute finding or dilatation. Right carotid system: Mild for age atheromatous plaque at the common carotid bifurcation. No stenosis, ulceration, or beading. Left carotid system: Mild for age  calcified plaque at the common carotid bifurcation without stenosis or ulceration. ICA tortuosity without beading. Vertebral arteries: Proximal subclavian atherosclerosis on the left. No flow limiting stenosis. Left vertebral artery is dominant. Both vertebral arteries are smooth and widely patent to the dura. Skeleton: No acute or aggressive finding. Other neck: Negative Upper chest: Negative Review of the MIP images confirms the above findings CTA HEAD FINDINGS Anterior circulation: Symmetric small appearance of intracranial medium size vessels primarily attributed to bolus timing timing. There is atherosclerotic plaque on the carotid siphons. No major branch occlusion. Equivocal for focal stenosis at the origin of the upper division left M2 branch. There is early branching of the left MCA compared to the right, which accounts for asymmetric appearance on the MIP images. Negative for aneurysm. Posterior circulation: Dominant left vertebral artery. The distal right vertebral artery is narrow and likely atherosclerotic. Basilar is smooth and diffusely patent. Hypoplastic or aplastic right P1 segment. Symmetric PCA flow without proximal occlusion. Venous sinuses: Not visualized Anatomic variants: As above Delayed phase: Not obtained in the emergent setting Review of the MIP images confirms the above findings CT Brain Perfusion Findings: CBF (<30%) Volume: 56mL Perfusion (Tmax>6.0s) volume: 13-centered in the left parietal region. These results were called by telephone at the time of interpretation on 09/27/2017 at 3:20 pm to Dr. Roland Rack , who verbally acknowledged these results. IMPRESSION: 1. No evidence of large vessel occlusion. Visualization of the medium and distal intracranial vessels is limited by bolus timing. 2. No infarct by CT perfusion. 13 cc volume of ischemic range delayed perfusion in the left parietal region, vessel size likely below vessel resolution of this CTA. 3. Equivocal focal stenosis  at the origin of the upper division left M2 branch. 4. Mild atherosclerosis in the neck without flow limiting stenosis or embolic source. Electronically Signed   By: Monte Fantasia M.D.   On: 09/27/2017 15:26   Ct Head Code Stroke Wo Contrast  Result Date: 09/27/2017 CLINICAL DATA:  Code stroke. New onset RIGHT-sided weakness and aphasia. EXAM: CT HEAD WITHOUT CONTRAST TECHNIQUE: Contiguous axial images were obtained from the base of the skull through the vertex without intravenous contrast. COMPARISON:  CT head 01/24/2017. FINDINGS: Brain: No acute stroke, acute hemorrhage, mass lesion, hydrocephalus, or extra-axial fluid. Generalized atrophy. Hypoattenuation of white matter, likely small vessel  disease. Vascular: BILATERAL carotid atherosclerotic change, heavily calcified on the RIGHT. BILATERAL vertebral artery calcification. Asymmetric attenuation of the RIGHT M1 MCA is compared to the LEFT, see image 12 series 3, also focal hyperdensity of proximal M2 segment in the sylvian fissure is seen on series 5, image 28. While this does not correlate with clinical picture, findings are concerning for RIGHT anterior circulation large vessel occlusion. Skull: Calvarium intact. Sinuses/Orbits: No significant sinus disease.  Negative orbits. Other: None. ASPECTS [facility] Stroke Program Early CT Score) - Ganglionic level infarction (caudate, lentiform nuclei, internal capsule, insula, M1-M3 cortex): 7 - Supraganglionic infarction (M4-M6 cortex): 3 Total score (0-10 with 10 being normal): 10 IMPRESSION: 1. Atrophy and small vessel disease. No visible acute cortical infarction or hemorrhage. Concern for emergent large vessel occlusion of the RIGHT MCA, although the clinical picture does not correlate with RIGHT hemisphere ischemia. CTA is pending. 2. ASPECTS is 10. These results were called by telephone at the time of interpretation on 09/27/2017 at 2:55pm to Dr. Leonel Ramsay, Who verbally acknowledged these results.  Electronically Signed   By: Staci Righter M.D.   On: 09/27/2017 15:02    Procedures Procedures (including critical care time)  Medications Ordered in ED Medications  iopamidol (ISOVUE-370) 76 % injection (has no administration in time range)   stroke: mapping our early stages of recovery book (has no administration in time range)  iopamidol (ISOVUE-370) 76 % injection 100 mL (100 mLs Intravenous Contrast Given 09/27/17 1456)  sodium chloride 0.9 % bolus 1,000 mL (1,000 mLs Intravenous New Bag/Given 09/27/17 1531)     Initial Impression / Assessment and Plan / ED Course  I have reviewed the triage vital signs and the nursing notes.  Pertinent labs & imaging results that were available during my care of the patient were reviewed by me and considered in my medical decision making (see chart for details).    84 year old female with history of prior subarachnoid hemorrhage, cerebral amyloid angiopathy, and IBS presents to the emergency department today for evaluation of weakness of the right arm.   Patient afebrile and hemodynamically stable as detailed above.  Moving all extremities though favoring left side.  Maintaining airway and will occasionally speak though does not reliably answer questions or follow commands.  Code stroke called given weakness on right side.  Glucose 80s. Evaluated in ED by neuro team. Findings remarkable for small area of ischemia on CT perfusion on right. Not good tPA candidate given prior SAHs and cerebral amyloid. Neuro recommends medicine admission for stroke workup including echo/MRI. Discussed with hospitalist who will admit. CBC, CMP, Trop all unremarkable in ED.  Beginning to answer questions again.Seizure on differential. Neuro discussing EEG. Stable in ED w/no acute events. Awaiting bed at handoff at 4:38 PM to Dr. Drue Flirt.    Case and plan of care discussed with Dr. Ralene Bathe.   Final Clinical Impressions(s) / ED Diagnoses   Final diagnoses:  Right arm weakness    Numbness    ED Discharge Orders    None       Corrie Dandy, MD 09/27/17 1638    Quintella Reichert, MD 10/03/17 1355

## 2017-09-27 NOTE — Code Documentation (Addendum)
84 yo female coming from home with her family when she had an episode of right arm and leg numbness. Pt was brought to the ED by family and symptoms had resolved. After patient was triage, pt had trouble getting her words out and bilateral weakness. LKW 1420 per ED Staff. Code Stroke Activated. Stroke Team met patient in Trauma C. Initial NIHSS 17 due to inability to follow commands, answer questions, bilateral arm and leg weakness, ataxia in the right arm and expressive aphasia. CT Negative for hemorrhage. 18 gauge IV placed in the right AC. CTA/CTP completed. No LVO. Pt returned to Trauma C. Symptoms resolving. No tPA due to high risk of bleeding. Pt will remain in the tPA window until 1730, and will have risk and benefits discussion if needed.  Family reports LKW 1300 before symptoms started. Handoff given to Santiago Glad, Therapist, sports

## 2017-09-27 NOTE — ED Notes (Signed)
Pt returned to desk with family reporting onset of aphasia, pt hyperventilating on arrival back to triage, right arm now has no resistance to gravity and right leg have no resistance to gravity, pt taken back to room and code stroke activated- pt has already had lab work and head CT completed

## 2017-09-27 NOTE — H&P (Signed)
History and Physical    [name]:[id number] DOB: [birth year] DOA: 09/27/2017  PCP: Pleas Koch, NP  Patient coming from: Shopping when she noticed her symptoms  I have personally briefly reviewed patient's old medical records in [place]  Chief Complaint: Numbness of the left side with weakness which began today  HPI: Shari Vincent is a 84 y.o. female with medical history significant of hyperlipidemia, trigeminal neuralgia, vertigo, arthritis, dyslipidemia, diverticulitis, cerebral amyloid angiopathy, subarachnoid hemorrhage 06/25/2016 who presents with symptoms of left-sided numbness and weakness which is since resolved.  Patient was out shopping and developed some right arm weakness numbness and tingling.  She was out with her daughter and this was approximately 1 PM.  On presentation to the emergency department her symptoms had resolved.  She reported at that time that this has happened in the past but her symptoms have resolved much faster than they did today.  Family then presented to the desk and noticed of high staff the patient now had a aphasia and was hyperventilating on arrival back to triage.  Right arm had no resistance to gravity and right leg had no resistance to gravity.  Code stroke was activated.  She was seen by neurology however her examination was inconsistent she was unable to move her arm to command but was able to scratch her nose the same arm.  Patient subarachnoid hemorrhage last May uses a cane for ambulation.  Patient started complaining of right arm numbness and tingling while they were shopping.  He had no unsteady gait, acute confusion, or dysarthria.  The time they arrived at triage her symptoms had resolved.  Initial NIH SS was 17.  Within 30 minutes her symptoms had resolved.  And she became lucid and mild expressive aphasia with word finding difficulties persisted.  Over the last week patient reports that she has had intermittent symptoms of  right arm numbness and tingling which resolved spontaneously.  Head CT was unremarkable for infarct or hemorrhage.  Head and neck showed no evidence of large vessel occlusion or infarction but there was equivocal focal stenosis at the origin of the upper division of the left M2 branch.  Patient was referred to triad hospitalist for relation and management.  Family reports patient takes no aspirin since her subarachnoid hemorrhage.  Patient also reports that she stopped taking her statin many years ago because the medication she was taking upset her stomach and hurt her legs.  Review of Systems: As per HPI otherwise all other systems reviewed and  negative.    Past Medical History:  Diagnosis Date  . Acid reflux   . Arthritis    right knee  . Bulging lumbar disc   . Cancer ([place])    Basal Cell on right shoulder  . Colon polyps   . Diverticulitis   . High cholesterol   . Irritable bowel syndrome (IBS)   . Kidney stone   . Knee pain    right  . Sciatic pain   . Subarachnoid hemorrhage ([place])   . Trigeminal neuralgia   . Trigeminal neuralgia of right side of face   . Trigeminal neuralgia syndrome   . Vertigo     Past Surgical History:  Procedure Laterality Date  . BREAST BIOPSY Left   . BREAST SURGERY     biopsy in 1990   . CATARACT EXTRACTION    . COLONOSCOPY  05/27/2008   [id number] rectal polyp,s/p bx/sigmoid diverticula. hyperplastic polyps. benign random bx  .  ESOPHAGOGASTRODUODENOSCOPY  05/27/2008   [id number] HH  . EYE SURGERY    . FOOT SURGERY     Left foot, metal plate  . HYSTEROSCOPY W/D&C N/A 09/03/2014   Procedure: DILATATION AND CURETTAGE /HYSTEROSCOPY;  Surgeon: Donnamae Jude, MD;  Location: [facility];  Service: Gynecology;  Laterality: N/A;  Requesting 09/03/14 @ 2:00p  . HYSTEROSCOPY W/D&C N/A 08/19/2015   Procedure: DILATATION AND CURETTAGE /HYSTEROSCOPY;  Surgeon: Donnamae Jude, MD;  Location: [facility];  Service: Gynecology;  Laterality: N/A;  .  LITHOTRIPSY    . OTHER SURGICAL HISTORY     Uterine Prolapse/Tacking  . PROLAPSED UTERINE FIBROID LIGATION     in 83    Social History   Social History Narrative   Married 35 years.   Has three children, three grandchildren, one great grandchildren.   Enjoys going to church, baking, shop.   Caffeine: occasional tea when eating out   Right-handed     reports that she has quit smoking. Her smoking use included cigarettes. She has never used smokeless tobacco. She reports that she does not drink alcohol or use drugs.  Allergies  Allergen Reactions  . Albuterol Rash  . Cefdinir Diarrhea  . Lactose Intolerance (Gi) Nausea And Vomiting and Other (See Comments)    Patient has IBS  . Levofloxacin Nausea Only  . Other Nausea And Vomiting    No greasy foods!!  . Adhesive [Tape] Rash    Paper tape is tolerated  . Epinephrine Anxiety and Palpitations  . Latex Rash    Family History  Problem Relation Age of Onset  . Diabetes Mother   . Heart disease Mother        MI  . Uterine cancer Mother   . Stroke Father   . Cancer Paternal Grandmother        intestinal  . Stomach cancer Maternal Grandmother   . Colon cancer Neg Hx   . Breast cancer Neg Hx     Prior to Admission medications   Medication Sig Start Date End Date Taking? Authorizing Provider  acetaminophen (TYLENOL) 325 MG tablet Take 325 mg by mouth every 6 (six) hours as needed (headaches or pain).    Yes [provider]  calcium citrate-vitamin D (CITRACAL+D) 315-200 MG-UNIT tablet Take 1 tablet by mouth daily with lunch. Reported on 08/09/2015   Yes [provider]  Cholecalciferol (D3 MAXIMUM STRENGTH) 5000 units capsule Take 5,000 Units by mouth daily with lunch.    Yes [provider]  gabapentin (NEURONTIN) 100 MG capsule TAKE 1 CAPSULE BY MOUTH ONCE DAILY AT BEDTIME Patient taking differently: Take 100 mg by mouth at bedtime.  04/16/17  Yes Pleas Koch, NP  hyoscyamine (LEVSIN,  ANASPAZ) 0.125 MG tablet Take 0.125 mg by mouth every 4 (four) hours as needed (ibs/stomach pain).    Yes [provider]  Lacosamide (VIMPAT) 100 MG TABS Take 1 tablet (100 mg total) by mouth 2 (two) times daily. Please call for an appt. [phone] Patient taking differently: Take 100 mg by mouth 2 (two) times daily.  09/07/17  Yes Melvenia Beam, MD  Liniments (BLUE-EMU SUPER STRENGTH) CREA Apply 1 application topically 3 (three) times daily as needed (for knee and back pain).    Yes [provider]  meclizine (ANTIVERT) 25 MG tablet Take 25 mg by mouth 3 (three) times daily as needed for dizziness (vertigo). Reported on 05/13/2015   Yes [provider]  pantoprazole (PROTONIX) 20 MG tablet TAKE 1  TABLET BY MOUTH TWICE DAILY 08/22/17  Yes Annitta Needs, NP  Polyethyl Glycol-Propyl Glycol (SYSTANE OP) Place 1 drop into both eyes 2 (two) times daily.   Yes [provider]  Probiotic Product (DIGESTIVE ADVANTAGE GUMMIES) CHEW Chew 1 tablet by mouth daily.   Yes [provider]    Physical Exam:  Constitutional: NAD, calm, comfortable Vitals:   09/27/17 1545 09/27/17 1600 09/27/17 1615 09/27/17 1630  BP: (!) 146/70 140/73 132/74 139/74  Pulse: 80 79 79 81  Resp: 15 17 19 16   Temp:      SpO2: 97% 98% 100% 98%  Weight:      Height:       Eyes: PERRL, lids and conjunctivae normal ENMT: Mucous membranes are moist. Posterior pharynx clear of any exudate or lesions.Normal dentition.  Neck: normal, supple, no masses, no thyromegaly Respiratory: clear to auscultation bilaterally, no wheezing, no crackles. Normal respiratory effort. No accessory muscle use.  Cardiovascular: Regular rate and rhythm, no murmurs / rubs / gallops. No extremity edema. 2+ pedal pulses. No carotid bruits.  Abdomen: no tenderness, no masses palpated. No hepatosplenomegaly. Bowel sounds positive.  Musculoskeletal: no clubbing / cyanosis. No joint deformity upper and lower  extremities. Good ROM, no contractures. Normal muscle tone.  Skin: no rashes, lesions, ulcers. No induration Neurologic: CN 2-12 grossly intact. Sensation intact, DTR normal. Strength 5/5 in all 4.  Mild word finding problems Psychiatric: Normal judgment and insight. Alert and oriented x 3. Normal mood.    Labs on Admission: I have personally reviewed following labs and imaging studies  CBC: Recent Labs  Lab 09/27/17 1408 09/27/17 1518  WBC 8.4  --   NEUTROABS 4.6  --   HGB 13.1 13.9  HCT 40.2 41.0  MCV 93.1  --   PLT 181  --    Basic Metabolic Panel: Recent Labs  Lab 09/27/17 1408 09/27/17 1518  NA 140 139  K 4.3 4.8  CL 105 105  CO2 25  --   GLUCOSE 91 82  BUN 12 17  CREATININE 0.94 0.90  CALCIUM 9.5  --    GFR: Estimated Creatinine Clearance: 42.4 mL/min (by C-G formula based on SCr of 0.9 mg/dL). Liver Function Tests: Recent Labs  Lab 09/27/17 1408  AST 22  ALT 7  ALKPHOS 64  BILITOT 0.6  PROT 7.0  ALBUMIN 4.0   Coagulation Profile: Recent Labs  Lab 09/27/17 1408  INR 0.92   CBG: Recent Labs  Lab 09/27/17 1432  GLUCAP 81   Urine analysis:    Component Value Date/Time   COLORURINE STRAW (A) 06/25/2016 1200   APPEARANCEUR CLEAR 06/25/2016 1200   LABSPEC 1.009 06/25/2016 1200   PHURINE 7.0 06/25/2016 1200   GLUCOSEU NEGATIVE 06/25/2016 1200   HGBUR NEGATIVE 06/25/2016 1200   BILIRUBINUR negative 10/04/2016 1427   KETONESUR NEGATIVE 06/25/2016 1200   PROTEINUR negative 10/04/2016 1427   PROTEINUR NEGATIVE 06/25/2016 1200   UROBILINOGEN 0.2 10/04/2016 1427   NITRITE negative 10/04/2016 1427   NITRITE NEGATIVE 06/25/2016 1200   LEUKOCYTESUR Negative 10/04/2016 1427    Radiological Exams on Admission: Ct Angio Head W Or Wo Contrast  Result Date: 09/27/2017 CLINICAL DATA:  Left-sided weakness EXAM: CT ANGIOGRAPHY HEAD AND NECK CT PERFUSION BRAIN TECHNIQUE: Multidetector CT imaging of the head and neck was performed using the standard  protocol during bolus administration of intravenous contrast. Multiplanar CT image reconstructions and MIPs were obtained to evaluate the vascular anatomy. Carotid stenosis measurements (when applicable) are obtained  utilizing NASCET criteria, using the distal internal carotid diameter as the denominator. Multiphase CT imaging of the brain was performed following IV bolus contrast injection. Subsequent parametric perfusion maps were calculated using RAPID software. CONTRAST:  139mL ISOVUE-370 IOPAMIDOL (ISOVUE-370) INJECTION 76% COMPARISON:  Noncontrast head CT from earlier today FINDINGS: CTA NECK FINDINGS Aortic arch: Atherosclerotic plaque. Three vessel branching. No acute finding or dilatation. Right carotid system: Mild for age atheromatous plaque at the common carotid bifurcation. No stenosis, ulceration, or beading. Left carotid system: Mild for age calcified plaque at the common carotid bifurcation without stenosis or ulceration. ICA tortuosity without beading. Vertebral arteries: Proximal subclavian atherosclerosis on the left. No flow limiting stenosis. Left vertebral artery is dominant. Both vertebral arteries are smooth and widely patent to the dura. Skeleton: No acute or aggressive finding. Other neck: Negative Upper chest: Negative Review of the MIP images confirms the above findings CTA HEAD FINDINGS Anterior circulation: Symmetric small appearance of intracranial medium size vessels primarily attributed to bolus timing timing. There is atherosclerotic plaque on the carotid siphons. No major branch occlusion. Equivocal for focal stenosis at the origin of the upper division left M2 branch. There is early branching of the left MCA compared to the right, which accounts for asymmetric appearance on the MIP images. Negative for aneurysm. Posterior circulation: Dominant left vertebral artery. The distal right vertebral artery is narrow and likely atherosclerotic. Basilar is smooth and diffusely patent.  Hypoplastic or aplastic right P1 segment. Symmetric PCA flow without proximal occlusion. Venous sinuses: Not visualized Anatomic variants: As above Delayed phase: Not obtained in the emergent setting Review of the MIP images confirms the above findings CT Brain Perfusion Findings: CBF (<30%) Volume: 63mL Perfusion (Tmax>6.0s) volume: 13-centered in the left parietal region. These results were called by telephone at the time of interpretation on 09/27/2017 at 3:20 pm to Dr. Roland Rack , who verbally acknowledged these results. IMPRESSION: 1. No evidence of large vessel occlusion. Visualization of the medium and distal intracranial vessels is limited by bolus timing. 2. No infarct by CT perfusion. 13 cc volume of ischemic range delayed perfusion in the left parietal region, vessel size likely below vessel resolution of this CTA. 3. Equivocal focal stenosis at the origin of the upper division left M2 branch. 4. Mild atherosclerosis in the neck without flow limiting stenosis or embolic source. Electronically Signed   By: Monte Fantasia M.D.   On: 09/27/2017 15:26   Ct Angio Neck W Or Wo Contrast  Result Date: 09/27/2017 CLINICAL DATA:  Left-sided weakness EXAM: CT ANGIOGRAPHY HEAD AND NECK CT PERFUSION BRAIN TECHNIQUE: Multidetector CT imaging of the head and neck was performed using the standard protocol during bolus administration of intravenous contrast. Multiplanar CT image reconstructions and MIPs were obtained to evaluate the vascular anatomy. Carotid stenosis measurements (when applicable) are obtained utilizing NASCET criteria, using the distal internal carotid diameter as the denominator. Multiphase CT imaging of the brain was performed following IV bolus contrast injection. Subsequent parametric perfusion maps were calculated using RAPID software. CONTRAST:  169mL ISOVUE-370 IOPAMIDOL (ISOVUE-370) INJECTION 76% COMPARISON:  Noncontrast head CT from earlier today FINDINGS: CTA NECK FINDINGS Aortic  arch: Atherosclerotic plaque. Three vessel branching. No acute finding or dilatation. Right carotid system: Mild for age atheromatous plaque at the common carotid bifurcation. No stenosis, ulceration, or beading. Left carotid system: Mild for age calcified plaque at the common carotid bifurcation without stenosis or ulceration. ICA tortuosity without beading. Vertebral arteries: Proximal subclavian atherosclerosis on the left. No flow  limiting stenosis. Left vertebral artery is dominant. Both vertebral arteries are smooth and widely patent to the dura. Skeleton: No acute or aggressive finding. Other neck: Negative Upper chest: Negative Review of the MIP images confirms the above findings CTA HEAD FINDINGS Anterior circulation: Symmetric small appearance of intracranial medium size vessels primarily attributed to bolus timing timing. There is atherosclerotic plaque on the carotid siphons. No major branch occlusion. Equivocal for focal stenosis at the origin of the upper division left M2 branch. There is early branching of the left MCA compared to the right, which accounts for asymmetric appearance on the MIP images. Negative for aneurysm. Posterior circulation: Dominant left vertebral artery. The distal right vertebral artery is narrow and likely atherosclerotic. Basilar is smooth and diffusely patent. Hypoplastic or aplastic right P1 segment. Symmetric PCA flow without proximal occlusion. Venous sinuses: Not visualized Anatomic variants: As above Delayed phase: Not obtained in the emergent setting Review of the MIP images confirms the above findings CT Brain Perfusion Findings: CBF (<30%) Volume: 14mL Perfusion (Tmax>6.0s) volume: 13-centered in the left parietal region. These results were called by telephone at the time of interpretation on 09/27/2017 at 3:20 pm to Dr. Roland Rack , who verbally acknowledged these results. IMPRESSION: 1. No evidence of large vessel occlusion. Visualization of the medium and  distal intracranial vessels is limited by bolus timing. 2. No infarct by CT perfusion. 13 cc volume of ischemic range delayed perfusion in the left parietal region, vessel size likely below vessel resolution of this CTA. 3. Equivocal focal stenosis at the origin of the upper division left M2 branch. 4. Mild atherosclerosis in the neck without flow limiting stenosis or embolic source. Electronically Signed   By: Monte Fantasia M.D.   On: 09/27/2017 15:26   Ct Cerebral Perfusion W Contrast  Result Date: 09/27/2017 CLINICAL DATA:  Left-sided weakness EXAM: CT ANGIOGRAPHY HEAD AND NECK CT PERFUSION BRAIN TECHNIQUE: Multidetector CT imaging of the head and neck was performed using the standard protocol during bolus administration of intravenous contrast. Multiplanar CT image reconstructions and MIPs were obtained to evaluate the vascular anatomy. Carotid stenosis measurements (when applicable) are obtained utilizing NASCET criteria, using the distal internal carotid diameter as the denominator. Multiphase CT imaging of the brain was performed following IV bolus contrast injection. Subsequent parametric perfusion maps were calculated using RAPID software. CONTRAST:  141mL ISOVUE-370 IOPAMIDOL (ISOVUE-370) INJECTION 76% COMPARISON:  Noncontrast head CT from earlier today FINDINGS: CTA NECK FINDINGS Aortic arch: Atherosclerotic plaque. Three vessel branching. No acute finding or dilatation. Right carotid system: Mild for age atheromatous plaque at the common carotid bifurcation. No stenosis, ulceration, or beading. Left carotid system: Mild for age calcified plaque at the common carotid bifurcation without stenosis or ulceration. ICA tortuosity without beading. Vertebral arteries: Proximal subclavian atherosclerosis on the left. No flow limiting stenosis. Left vertebral artery is dominant. Both vertebral arteries are smooth and widely patent to the dura. Skeleton: No acute or aggressive finding. Other neck: Negative  Upper chest: Negative Review of the MIP images confirms the above findings CTA HEAD FINDINGS Anterior circulation: Symmetric small appearance of intracranial medium size vessels primarily attributed to bolus timing timing. There is atherosclerotic plaque on the carotid siphons. No major branch occlusion. Equivocal for focal stenosis at the origin of the upper division left M2 branch. There is early branching of the left MCA compared to the right, which accounts for asymmetric appearance on the MIP images. Negative for aneurysm. Posterior circulation: Dominant left vertebral artery. The distal right  vertebral artery is narrow and likely atherosclerotic. Basilar is smooth and diffusely patent. Hypoplastic or aplastic right P1 segment. Symmetric PCA flow without proximal occlusion. Venous sinuses: Not visualized Anatomic variants: As above Delayed phase: Not obtained in the emergent setting Review of the MIP images confirms the above findings CT Brain Perfusion Findings: CBF (<30%) Volume: 80mL Perfusion (Tmax>6.0s) volume: 13-centered in the left parietal region. These results were called by telephone at the time of interpretation on 09/27/2017 at 3:20 pm to Dr. Roland Rack , who verbally acknowledged these results. IMPRESSION: 1. No evidence of large vessel occlusion. Visualization of the medium and distal intracranial vessels is limited by bolus timing. 2. No infarct by CT perfusion. 13 cc volume of ischemic range delayed perfusion in the left parietal region, vessel size likely below vessel resolution of this CTA. 3. Equivocal focal stenosis at the origin of the upper division left M2 branch. 4. Mild atherosclerosis in the neck without flow limiting stenosis or embolic source. Electronically Signed   By: Monte Fantasia M.D.   On: 09/27/2017 15:26   Ct Head Code Stroke Wo Contrast  Result Date: 09/27/2017 CLINICAL DATA:  Code stroke. New onset RIGHT-sided weakness and aphasia. EXAM: CT HEAD WITHOUT CONTRAST  TECHNIQUE: Contiguous axial images were obtained from the base of the skull through the vertex without intravenous contrast. COMPARISON:  CT head 01/24/2017. FINDINGS: Brain: No acute stroke, acute hemorrhage, mass lesion, hydrocephalus, or extra-axial fluid. Generalized atrophy. Hypoattenuation of white matter, likely small vessel disease. Vascular: BILATERAL carotid atherosclerotic change, heavily calcified on the RIGHT. BILATERAL vertebral artery calcification. Asymmetric attenuation of the RIGHT M1 MCA is compared to the LEFT, see image 12 series 3, also focal hyperdensity of proximal M2 segment in the sylvian fissure is seen on series 5, image 28. While this does not correlate with clinical picture, findings are concerning for RIGHT anterior circulation large vessel occlusion. Skull: Calvarium intact. Sinuses/Orbits: No significant sinus disease.  Negative orbits. Other: None. ASPECTS [facility] Stroke Program Early CT Score) - Ganglionic level infarction (caudate, lentiform nuclei, internal capsule, insula, M1-M3 cortex): 7 - Supraganglionic infarction (M4-M6 cortex): 3 Total score (0-10 with 10 being normal): 10 IMPRESSION: 1. Atrophy and small vessel disease. No visible acute cortical infarction or hemorrhage. Concern for emergent large vessel occlusion of the RIGHT MCA, although the clinical picture does not correlate with RIGHT hemisphere ischemia. CTA is pending. 2. ASPECTS is 10. These results were called by telephone at the time of interpretation on 09/27/2017 at 2:55pm to Dr. Leonel Ramsay, Who verbally acknowledged these results. Electronically Signed   By: Staci Righter M.D.   On: 09/27/2017 15:02    EKG: Independently reviewed.  Normal sinus rhythm with a left bundle branch block unchanged from prior  Assessment/Plan Principal Problem:   TIA (transient ischemic attack) Active Problems:   Hyperlipidemia   Subarachnoid hemorrhage (HCC)   Cerebral amyloid angiopathy (CODE)   GERD   VERTIGO     1.  TIA: Patient will be admitted into the hospital she is Artie had an ACE angiogram she does not require venous Dopplers at this point.  I will withhold ordering an echocardiogram as she had one in 2018 and currently her EKG shows a sinus rhythm.  We will proceed with an MRI of the brain per neurology recommendations.  We will continue neuro checks.  Patient to be admitted under TIA/stroke protocol.  2.  Hyperlipidemia: Patient reports not taking a statin due to history of problems with her legs and abdominal  discomfort.  I have recommended that she try coenzyme Q 10 while taking her statin for assistance with myalgias associated with statins.  We will start Lipitor.  Unfortunately coenzyme Q 10 is not on formulary would recommend she start that at discharge.  3.  Subarachnoid hemorrhage: Due to history of subarachnoid hemorrhage would not recommend aspirin at this point we will continue to monitor neurological status.  4.  Cerebral amyloid angiopathy: This is the main reason why patient has had subarachnoid hemorrhages in the past.  Will not start aspirin given this underlying significant risk factor for bleeding.  5.  Gastroesophageal reflux disease: Continue proton pump inhibitor.  6.  Vertigo: We will hold meclizine for now.  Would consider restarting as an outpatient if necessary.  Ideally in a patient in her 70s meclizine should be avoided if at all possible.  DVT prophylaxis: SCDs Code Status: *DO NOT RESUSCITATE Family Communication: Spoke with patient, her son, her daughter, and her husband who are all present at the time of admission. Disposition Plan: Likely home in 24 to 48 hours Consults called: Neurology Dr. Leonel Ramsay Admission status: Observation   Lady Deutscher MD [facility] Pager [phone]  If 7PM-7AM, please contact night-coverage [email or web address] Password [facility]  09/27/2017, 5:41 PM

## 2017-09-27 NOTE — Consult Note (Addendum)
Referring Physician: Quintella Reichert, MD    Chief Complaint: Numbness   HPI: Shari Vincent is an 84 y.o. female history of hyperlipidemia, trigeminal neuralgia, vertigo, arthritis, dyslipidemia, diverticulitis, cerebral amyloid angiopathy, subarachnoid hemorrhage 95/6/18)with symptoms of left-sided numbness and weakness which has since resolved.  Patient presents to the ED today with complaints of acute right arm weakness numbness and tingling while shopping with family around 1 PM today.Neuro was consulted for evaluation Per daughter since patient's incidence of subarachnoid hemorrhage, she has been using a cane for ambulation.  Today they were at the store and when leaving patient did complain of right arm numbness and tingling.  They deny unsteady gait, acute confusion, dysarthria, but by the time they got thorugh triage patient's symptoms has started getting worse with confusion, expressive aphasia and minimal ability to follow commands.  Initial NIHSS was 17, but within less than 30 minutes, symptoms had resolved, with patient being lucid with mild expressive aphasia and word finding difficulties.  Per daughter at the bedside, over the last several months patient has been slow in finding the right words but not as bad as today. Patient became more awake and lucid, states that over the last week she has had intermittent episodes of right arm numbness and tingling which resolves spontaneously.  She denies recent illnesses, fevers, chills, nausea, vomiting, recent head trauma, dizziness, syncope or lightheadedness, ocular pain or extremity weakness.  Emergent CT of the head did not show cortical evidence of infarct or hemorrhage.  CTA of the head and neck and CT perfusion showed: No evidence of large vessel occlusion or infarct with Equivocal focal stenosis at the origin of the upper division left M2 branch.     LSN: 09/27/17 1pm tPA Given:No - NO LVO and symptoms almost resolved about 10 minutes  after getting out of CT  NIHSS: 17 initially   NIHSS 1 - 30 minutes later   Baseline Premorbid modified Rankin scale (mRS):1   Past Medical History:  Diagnosis Date  . Acid reflux   . Arthritis    right knee  . Bulging lumbar disc   . Cancer ([place])    Basal Cell on right shoulder  . Colon polyps   . Diverticulitis   . High cholesterol   . Irritable bowel syndrome (IBS)   . Kidney stone   . Knee pain    right  . Sciatic pain   . Subarachnoid hemorrhage ([place])   . Trigeminal neuralgia   . Trigeminal neuralgia of right side of face   . Trigeminal neuralgia syndrome   . Vertigo     Past Surgical History:  Procedure Laterality Date  . BREAST BIOPSY Left   . BREAST SURGERY     biopsy in 1990   . CATARACT EXTRACTION    . COLONOSCOPY  05/27/2008   WUJ:WJXBJYNWGN rectal polyp,s/p bx/sigmoid diverticula. hyperplastic polyps. benign random bx  . ESOPHAGOGASTRODUODENOSCOPY  05/27/2008   FAO:ZHYQMV/HQION HH  . EYE SURGERY    . FOOT SURGERY     Left foot, metal plate  . HYSTEROSCOPY W/D&C N/A 09/03/2014   Procedure: DILATATION AND CURETTAGE /HYSTEROSCOPY;  Surgeon: Donnamae Jude, MD;  Location: [facility];  Service: Gynecology;  Laterality: N/A;  Requesting 09/03/14 @ 2:00p  . HYSTEROSCOPY W/D&C N/A 08/19/2015   Procedure: DILATATION AND CURETTAGE /HYSTEROSCOPY;  Surgeon: Donnamae Jude, MD;  Location: [facility];  Service: Gynecology;  Laterality: N/A;  . LITHOTRIPSY    . OTHER SURGICAL HISTORY     Uterine  Prolapse/Tacking  . PROLAPSED UTERINE FIBROID LIGATION     in 1960    Family History  Problem Relation Age of Onset  . Diabetes Mother   . Heart disease Mother        MI  . Uterine cancer Mother   . Stroke Father   . Cancer Paternal Grandmother        intestinal  . Stomach cancer Maternal Grandmother   . Colon cancer Neg Hx   . Breast cancer Neg Hx    Social History:  reports that she has quit smoking. Her smoking use included cigarettes. She has never used smokeless  tobacco. She reports that she does not drink alcohol or use drugs.  Allergies:  Allergies  Allergen Reactions  . Albuterol Rash  . Cefdinir Diarrhea  . Levofloxacin Nausea Only  . Epinephrine Anxiety and Other (See Comments)    Heart palpitations  . Latex Rash    Medications:  .  stroke: mapping our early stages of recovery book   Does not apply Once  . iopamidol        ROS: General: negative for - chills, fatigue, fever, night sweats Psychological  negative for - behavioral disorder, Ophthalmic : negative for - blurry vision, double vision, eye pain or loss of vision ENT: negative for - epistaxis, nasal discharge, vertigo, tinnitus Respiratory: negative for - cough, hemoptysis, shortness of breath or wheezing Cardiovascular: negative for - chest pain, dyspnea on exertion, edema or irregular heartbeat Gastrointestinal: negative for - abdominal pain, diarrhea, hematemesis, nausea/vomiting or stool incontinence Genito-Urinary: negative for - dysuria, hematuria, incontinence or urinary frequency/urgency Musculoskeletal: admits to arthritis with chronic knee pain Neurological: as noted in HPI Dermatological: negative for rash and skin lesion changes    Physical Examination: Blood pressure (!) 153/72, pulse 80, temperature 99 F (37.2 C), resp. rate 19, height 5' (1.524 m), weight 76 kg, SpO2 100 %. HEENT-  Normocephalic, no lesions, without obvious abnormality.  Normal external eye and conjunctiva.   Cardiovascular- S1-S2 audible, pulses palpable throughout   Lungs-no rhonchi or wheezing noted, no excessive working breathing.  Saturations within normal limits Abdomen- All 4 quadrants palpated and nontender Musculoskeletal-no joint tenderness, deformity or swelling Skin-warm and dry, no hyperpigmentation, vitiligo, or suspicious lesions  Neurological Examination Mental Status: Alert and oriented to self, city, situation, month and year, president. thought content appropriate.   Initially had dysarthric speech which improved, with some expressive aphasia and word finding difficulties.  Mild impairment in naming and recall. Able to follow 3 step commands without difficulty. Cranial Nerves: II: Visual fields grossly normal,  III,IV, VI: ptosis not present, extra-ocular motions intact bilaterally pupils equal, round, reactive to light  V,VII: smile symmetric, facial light touch sensation normal bilaterally VIII: Hearing intact to voice IX,X: uvula rises symmetrically XI: bilateral shoulder shrug XII: midline tongue extension Motor: Patient does move all extremities equally and with true equally when not following commands and was confused Right : Upper extremity   5/5    Left:     Upper extremity   5/5  Lower extremity   5/5     Lower extremity   5/5 Tone and bulk:normal tone throughout; no atrophy noted Sensory: Pinprick sensation and temperature sensation intact bilaterally Deep Tendon Reflexes: 2+ and symmetric throughout Plantars: Right: downgoing   Left: downgoing Cerebellar: normal finger-to-nose,  normal orbiting in forwards motion and abnormal in right arm with backward motion, unable to accurately perform heel to shin secondary to to bilateral knee pain with  Gait: Not tested secondary to safety   Results for orders placed or performed during the hospital encounter of 09/27/17 (from the past 48 hour(s))  Protime-INR     Status: None   Collection Time: 09/27/17  2:08 PM  Result Value Ref Range   Prothrombin Time 12.3 11.4 - 15.2 seconds   INR 0.92     Comment: Performed at [facility], [place] [address] [place], [place] [address]  APTT     Status: None   Collection Time: 09/27/17  2:08 PM  Result Value Ref Range   aPTT 31 24 - 36 seconds    Comment: Performed at [facility] [address], [place] [address]  CBC     Status: None   Collection Time: 09/27/17  2:08 PM  Result Value Ref Range   WBC 8.4 4.0 - 10.5 K/uL   RBC 4.32  3.87 - 5.11 MIL/uL   Hemoglobin 13.1 12.0 - 15.0 g/dL   HCT 40.2 36.0 - 46.0 %   MCV 93.1 78.0 - 100.0 fL   MCH 30.3 26.0 - 34.0 pg   MCHC 32.6 30.0 - 36.0 g/dL   RDW 13.1 11.5 - 15.5 %   Platelets 181 150 - 400 K/uL    Comment: Performed at [facility] [address] [place], [place] [address]  Differential     Status: None   Collection Time: 09/27/17  2:08 PM  Result Value Ref Range   Neutrophils Relative % 55 %   Neutro Abs 4.6 1.7 - 7.7 K/uL   Lymphocytes Relative 33 %   Lymphs Abs 2.8 0.7 - 4.0 K/uL   Monocytes Relative 9 %   Monocytes Absolute 0.7 0.1 - 1.0 K/uL   Eosinophils Relative 2 %   Eosinophils Absolute 0.2 0.0 - 0.7 K/uL   Basophils Relative 1 %   Basophils Absolute 0.1 0.0 - 0.1 K/uL   Immature Granulocytes 0 %   Abs Immature Granulocytes 0.0 0.0 - 0.1 K/uL    Comment: Performed at [facility] [address], [place], [place] [address]  Comprehensive metabolic panel     Status: Abnormal   Collection Time: 09/27/17  2:08 PM  Result Value Ref Range   Sodium 140 135 - 145 mmol/L   Potassium 4.3 3.5 - 5.1 mmol/L    Comment: SLIGHT HEMOLYSIS   Chloride 105 98 - 111 mmol/L   CO2 25 22 - 32 mmol/L   Glucose, Bld 91 70 - 99 mg/dL   BUN 12 8 - 23 mg/dL   Creatinine, Ser 0.94 0.44 - 1.00 mg/dL   Calcium 9.5 8.9 - 10.3 mg/dL   Total Protein 7.0 6.5 - 8.1 g/dL   Albumin 4.0 3.5 - 5.0 g/dL   AST 22 15 - 41 U/L   ALT 7 0 - 44 U/L   Alkaline Phosphatase 64 38 - 126 U/L   Total Bilirubin 0.6 0.3 - 1.2 mg/dL   GFR calc non Af Amer 54 (L) >60 mL/min   GFR calc Af Amer >60 >60 mL/min    Comment: (NOTE) The eGFR has been calculated using the CKD EPI equation. This calculation has not been validated in all clinical situations. eGFR's persistently <60 mL/min signify possible Chronic Kidney Disease.    Anion gap 10 5 - 15    Comment: Performed at [facility] [address], [place], [place] [address]  CBG monitoring, ED     Status: None  Collection Time: 09/27/17  2:32 PM  Result Value Ref Range   Glucose-Capillary 81 70 - 99 mg/dL  I-stat troponin, ED     Status: None   Collection Time: 09/27/17  3:16 PM  Result Value Ref Range   Troponin i, poc 0.01 0.00 - 0.08 ng/mL   Comment 3            Comment: Due to the release kinetics of cTnI, a negative result within the first hours of the onset of symptoms does not rule out myocardial infarction with certainty. If myocardial infarction is still suspected, repeat the test at appropriate intervals.   I-Stat Chem 8, ED     Status: Abnormal   Collection Time: 09/27/17  3:18 PM  Result Value Ref Range   Sodium 139 135 - 145 mmol/L   Potassium 4.8 3.5 - 5.1 mmol/L   Chloride 105 98 - 111 mmol/L   BUN 17 8 - 23 mg/dL   Creatinine, Ser 0.90 0.44 - 1.00 mg/dL   Glucose, Bld 82 70 - 99 mg/dL   Calcium, Ion 1.09 (L) 1.15 - 1.40 mmol/L   TCO2 27 22 - 32 mmol/L   Hemoglobin 13.9 12.0 - 15.0 g/dL   HCT 41.0 36.0 - 46.0 %   Ct Angio Head/Neck W Or Wo Contrast/ CT perfusion 09/27/2017 IMPRESSION: 1. No evidence of large vessel occlusion. Visualization of the medium and distal intracranial vessels is limited by bolus timing.  2. No infarct by CT perfusion. 13 cc volume of ischemic range delayed perfusion in the left parietal region, vessel size likely below vessel resolution of this CTA.  3. Equivocal focal stenosis at the origin of the upper division left M2 branch. 4. Mild atherosclerosis in the neck without flow limiting stenosis or embolic source.  Ct Head Code Stroke Wo Contrast 09/27/2017 IMPRESSION: 1. Atrophy and small vessel disease. No visible acute cortical infarction or hemorrhage. Concern for emergent large vessel occlusion of the RIGHT MCA, although the clinical picture does not correlate with RIGHT hemisphere ischemia. CTA is pending.  2. ASPECTS is 10.   Assessment: 84 y.o. female with a past medical history of hyperlipidemia, trigeminal neuralgia, diverticulitis,  cerebral amyloid angiopathy, subarachnoid hemorrhage secondary with symptoms of left-sided numbness and weakness which has since resolved who presents to the ED today with complaints of acute right arm weakness, numbness and tingling while shopping with family around 1 PM today.  After being seen by triage, patient became dysarthric with aphasia and minimal ability to follow commands.  About 10 minutes after CT head was performed, patient symptoms gradually improved, she was more lucid, able to give HPI and review of systems, follow commands equally with NIH score dropping from 17 to 1.  Daughter states patient mentation is back to her baseline but word finding difficulty and expressive aphasia is worse than her norm.  1.  Acute CVA in patient with Cerebral Amyloid angiopathy with SAH in 5.2018 and 2 other unknown incidences of SAH per review of head imaging Patient presented with acute right arm weakness, numbness and tingling, symptoms progressed to aphasia with confused speech and minimal ability to follow commands.  Symptoms waned about 30 minutes later with remnants of expressive aphasia and word finding difficulties.  Initial CT Noncon of the head did not show any visible acute infarct or hemorrhage, CT head and neck did not show evidence of LVO.  Although patient was within the 4-hour time frame for TPA, but due to patient's history of cerebral  amyloid angiopathy, deferred TPA administration, risk outweighs the benefits and also deferred antiplatelets.  This was discussed extensively with family who verbalized understanding.  Due to the waxing and waning nature of patient's symptoms, will continue more aggressive neuro monitoring every 30 minutes up until the end of the timeframe window and then continue frequent monitoring per stroke protocol.  We will pursue full stroke work-up with MRI of the brain to rule out further etiologies, echocardiogram and risk stratification labs hemoglobin A1c and fasting  lipid panel.   2. Hx of SAH in patient with Cerebral Amyloid Angiopathy -continue Keppra per home regimen -seizure precautions   3. Trigeminal Neuralgia -continue  Gabapentin and Trilpetal 4. HTN 5. GERD  Stroke Risk Factors - hyperlipidemia and hypertension, hx of Cerebral Amyloid Angiopathy  Plan: - HgbA1c, fasting lipid panel - MRI of the brain without contrast - PT consult, OT consult, Speech consult - Echocardiogram -No Prophylactic ASA due to  hx of Cerebral Amyloid Angiopathy - Allow for permissive hypertension for the first 24-48h - only treat PRN if SBP >180 mmHg. Blood pressures can be gradually normalized to SBP<140 upon discharge - Risk factor modification - Telemetry monitoring -  Continue Q 30 mins until out of the time frame window, then continue Frequent neuro checks per stroke protocol   Letha Cape DNP Neuro-hospitalist Team [phone] 09/27/2017, 3:43 PM

## 2017-09-27 NOTE — ED Notes (Signed)
Returned from CT- Dr. Leonel Ramsay at bedside, Stroke team at bedside

## 2017-09-27 NOTE — ED Triage Notes (Addendum)
Pt in c/o right arm numbness that started an hour ago, numbness has resolved at this time, pt reports this has happened before but symptoms resolve much faster than they did today, no deficits noted in triage

## 2017-09-27 NOTE — Progress Notes (Signed)
Patient arrived to the unit alert and oriented X 4  C/o of a little headache but she thinks it is because she needs food. All questions and concerns addressed. Bed in the lowest position with call light in reach.

## 2017-09-27 NOTE — ED Notes (Signed)
Pt remains NPO - and supine-- will need to do swallow screen once pt is out of window for TPA

## 2017-09-27 NOTE — ED Notes (Signed)
Pt's chin is quivering- daughter states that is not new.

## 2017-09-28 ENCOUNTER — Observation Stay (HOSPITAL_COMMUNITY): Payer: Medicare Other

## 2017-09-28 ENCOUNTER — Observation Stay (HOSPITAL_BASED_OUTPATIENT_CLINIC_OR_DEPARTMENT_OTHER): Payer: Medicare Other

## 2017-09-28 DIAGNOSIS — I680 Cerebral amyloid angiopathy: Secondary | ICD-10-CM

## 2017-09-28 DIAGNOSIS — G459 Transient cerebral ischemic attack, unspecified: Secondary | ICD-10-CM

## 2017-09-28 DIAGNOSIS — I609 Nontraumatic subarachnoid hemorrhage, unspecified: Secondary | ICD-10-CM | POA: Diagnosis not present

## 2017-09-28 DIAGNOSIS — R29898 Other symptoms and signs involving the musculoskeletal system: Secondary | ICD-10-CM | POA: Diagnosis not present

## 2017-09-28 DIAGNOSIS — K219 Gastro-esophageal reflux disease without esophagitis: Secondary | ICD-10-CM | POA: Diagnosis not present

## 2017-09-28 DIAGNOSIS — E785 Hyperlipidemia, unspecified: Secondary | ICD-10-CM | POA: Diagnosis not present

## 2017-09-28 DIAGNOSIS — R42 Dizziness and giddiness: Secondary | ICD-10-CM

## 2017-09-28 LAB — ECHOCARDIOGRAM COMPLETE
Height: 60 in
Weight: 2680.79 oz

## 2017-09-28 LAB — HEMOGLOBIN A1C
Hgb A1c MFr Bld: 5.6 % (ref 4.8–5.6)
Mean Plasma Glucose: 114.02 mg/dL

## 2017-09-28 LAB — LIPID PANEL
Cholesterol: 171 mg/dL (ref 0–200)
HDL: 32 mg/dL — ABNORMAL LOW (ref >40)
LDL Cholesterol: 102 mg/dL — ABNORMAL HIGH (ref 0–99)
Total CHOL/HDL Ratio: 5.3 RATIO
Triglycerides: 184 mg/dL — ABNORMAL HIGH (ref <150)
VLDL: 37 mg/dL (ref 0–40)

## 2017-09-28 MED ORDER — OXCARBAZEPINE 150 MG PO TABS
150.0000 mg | ORAL_TABLET | Freq: Two times a day (BID) | ORAL | Status: DC
  Administered 2017-09-28 – 2017-09-29 (×3): 150 mg via ORAL
  Filled 2017-09-28 (×3): qty 1

## 2017-09-28 MED ORDER — ROSUVASTATIN CALCIUM 5 MG PO TABS: 5.0000 mg | ORAL_TABLET | Freq: Every day | ORAL | Status: DC

## 2017-09-28 NOTE — Progress Notes (Signed)
PROGRESS NOTE    SECRET KRISTENSEN  WUJ:[id number] DOB: 08-05-1931 DOA: 09/27/2017 PCP: Pleas Koch, NP  Brief Narrative: KEARSTEN GINTHER is a 84 y.o. female with medical history significant of hyperlipidemia, trigeminal neuralgia, vertigo, arthritis, dyslipidemia, diverticulitis, cerebral amyloid angiopathy, subarachnoid hemorrhage 06/25/2016 who presents with symptoms of left-sided numbness and weakness which is since resolved.  Patient was out shopping and developed some right arm weakness numbness and tingling. -Subsequently brought to the ED, her symptoms resolved and while in the emergency room developed aphagia which improved  Assessment & Plan:     Acute CVA -MRI noted faint punctate acute/early subacute infarctions in left precentral gyrus and parietal cortex -CT Angio of head and neck did not note any large vessel occlusions -not on any antiplatelets at baseline due to history of amyloid angiopathy and subarachnoid hemorrhage in 06/2016 -Symptoms have resolved at this time -Neurology consulted -Await echocardiogram -Await PT/OT/SLP evaluations -EEG also ordered due to intermittent transient symptoms -LDL is 102, hemoglobin A1c is 5.6  Dyslipidemia -Has not been on statins due to myalgias and abdominal discomfort  Cerebral amyloid angiopathy and history of subarachnoid hemorrhage in 06/2016 -Monitor off antiplatelets  GERD -Continue PPI  DVT prophylaxis:and SCDs Code Status: DO NOT RESUSCITATE Family Communication:discussed with son and has been baseline Disposition Plan: home tomorrow pending workup  Consultants:   neurology   Procedures: EEG  Antimicrobials:    Subjective: -feels well, right-sided numbness numbness has improved, speech difficulties have resolved as well  Objective: Vitals:   09/28/17 0307 09/28/17 0323 09/28/17 0419 09/28/17 0930  BP: 123/69 132/78  98/68  Pulse: 67 65  68  Resp: (!) 21 13  14   Temp: 98.6 F (37 C) 98.6 F (37 C)  98.6 F (37 C) 98.4 F (36.9 C)  TempSrc: Oral Oral  Oral  SpO2: 98% 98%  100%  Weight:      Height:        Intake/Output Summary (Last 24 hours) at 09/28/2017 1348 Last data filed at 09/28/2017 0740 Gross per 24 hour  Intake 2735 ml  Output 1400 ml  Net 1335 ml   Filed Weights   09/27/17 1428  Weight: 76 kg    Examination:  General exam: Appears calm and comfortable  Respiratory system: Clear to auscultation. Respiratory effort normal. Cardiovascular system: S1 & S2 heard, RRR. No JVD, murmurs, rubs, gallops Gastrointestinal system: Abdomen is nondistended, soft and nontender.Normal bowel sounds heard. Central nervous system: Alert and oriented, cranial nerves II-12 intact Scott, motor 5 x 5 bilaterally, sensation light touch intact, DTR 1+, plantars withdrawal Extremities: no edema Skin: No rashes, lesions or ulcers Psychiatry: Judgement and insight appear normal. Mood & affect appropriate.     Data Reviewed:   CBC: Recent Labs  Lab 09/27/17 1408 09/27/17 1518  WBC 8.4  --   NEUTROABS 4.6  --   HGB 13.1 13.9  HCT 40.2 41.0  MCV 93.1  --   PLT 181  --    Basic Metabolic Panel: Recent Labs  Lab 09/27/17 1408 09/27/17 1518  NA 140 139  K 4.3 4.8  CL 105 105  CO2 25  --   GLUCOSE 91 82  BUN 12 17  CREATININE 0.94 0.90  CALCIUM 9.5  --    GFR: Estimated Creatinine Clearance: 42.4 mL/min (by C-G formula based on SCr of 0.9 mg/dL). Liver Function Tests: Recent Labs  Lab 09/27/17 1408  AST 22  ALT 7  ALKPHOS 64  BILITOT 0.6  PROT 7.0  ALBUMIN 4.0   No results for input(s): LIPASE, AMYLASE in the last 168 hours. No results for input(s): AMMONIA in the last 168 hours. Coagulation Profile: Recent Labs  Lab 09/27/17 1408  INR 0.92   Cardiac Enzymes: No results for input(s): CKTOTAL, CKMB, CKMBINDEX, TROPONINI in the last 168 hours. BNP (last 3 results) No results for input(s): PROBNP in the last 8760 hours. HbA1C: Recent Labs    09/28/17 0500   HGBA1C 5.6   CBG: Recent Labs  Lab 09/27/17 1432  GLUCAP 81   Lipid Profile: Recent Labs    09/28/17 0414  CHOL 171  HDL 32*  LDLCALC 102*  TRIG 184*  CHOLHDL 5.3   Thyroid Function Tests: No results for input(s): TSH, T4TOTAL, FREET4, T3FREE, THYROIDAB in the last 72 hours. Anemia Panel: No results for input(s): VITAMINB12, FOLATE, FERRITIN, TIBC, IRON, RETICCTPCT in the last 72 hours. Urine analysis:    Component Value Date/Time   COLORURINE STRAW (A) 06/25/2016 1200   APPEARANCEUR CLEAR 06/25/2016 1200   LABSPEC 1.009 06/25/2016 1200   PHURINE 7.0 06/25/2016 1200   GLUCOSEU NEGATIVE 06/25/2016 1200   HGBUR NEGATIVE 06/25/2016 1200   BILIRUBINUR negative 10/04/2016 1427   KETONESUR NEGATIVE 06/25/2016 1200   PROTEINUR negative 10/04/2016 1427   PROTEINUR NEGATIVE 06/25/2016 1200   UROBILINOGEN 0.2 10/04/2016 1427   NITRITE negative 10/04/2016 1427   NITRITE NEGATIVE 06/25/2016 1200   LEUKOCYTESUR Negative 10/04/2016 1427   Sepsis Labs: @LABRCNTIP (procalcitonin:4,lacticidven:4)  ) Recent Results (from the past 240 hour(s))  MRSA PCR Screening     Status: None   Collection Time: 09/27/17  7:32 PM  Result Value Ref Range Status   MRSA by PCR NEGATIVE NEGATIVE Final    Comment:        The GeneXpert MRSA Assay (FDA approved for NASAL specimens only), is one component of a comprehensive MRSA colonization surveillance program. It is not intended to diagnose MRSA infection nor to guide or monitor treatment for MRSA infections. Performed at [facility], [place] [address], [place], [place] [address]          Radiology Studies: Ct Angio Head W Or Wo Contrast  Result Date: 09/27/2017 CLINICAL DATA:  Left-sided weakness EXAM: CT ANGIOGRAPHY HEAD AND NECK CT PERFUSION BRAIN TECHNIQUE: Multidetector CT imaging of the head and neck was performed using the standard protocol during bolus administration of intravenous contrast. Multiplanar CT image  reconstructions and MIPs were obtained to evaluate the vascular anatomy. Carotid stenosis measurements (when applicable) are obtained utilizing NASCET criteria, using the distal internal carotid diameter as the denominator. Multiphase CT imaging of the brain was performed following IV bolus contrast injection. Subsequent parametric perfusion maps were calculated using RAPID software. CONTRAST:  119mL ISOVUE-370 IOPAMIDOL (ISOVUE-370) INJECTION 76% COMPARISON:  Noncontrast head CT from earlier today FINDINGS: CTA NECK FINDINGS Aortic arch: Atherosclerotic plaque. Three vessel branching. No acute finding or dilatation. Right carotid system: Mild for age atheromatous plaque at the common carotid bifurcation. No stenosis, ulceration, or beading. Left carotid system: Mild for age calcified plaque at the common carotid bifurcation without stenosis or ulceration. ICA tortuosity without beading. Vertebral arteries: Proximal subclavian atherosclerosis on the left. No flow limiting stenosis. Left vertebral artery is dominant. Both vertebral arteries are smooth and widely patent to the dura. Skeleton: No acute or aggressive finding. Other neck: Negative Upper chest: Negative Review of the MIP images confirms the above findings CTA HEAD FINDINGS Anterior circulation: Symmetric small appearance of intracranial medium size  vessels primarily attributed to bolus timing timing. There is atherosclerotic plaque on the carotid siphons. No major branch occlusion. Equivocal for focal stenosis at the origin of the upper division left M2 branch. There is early branching of the left MCA compared to the right, which accounts for asymmetric appearance on the MIP images. Negative for aneurysm. Posterior circulation: Dominant left vertebral artery. The distal right vertebral artery is narrow and likely atherosclerotic. Basilar is smooth and diffusely patent. Hypoplastic or aplastic right P1 segment. Symmetric PCA flow without proximal  occlusion. Venous sinuses: Not visualized Anatomic variants: As above Delayed phase: Not obtained in the emergent setting Review of the MIP images confirms the above findings CT Brain Perfusion Findings: CBF (<30%) Volume: 59mL Perfusion (Tmax>6.0s) volume: 13-centered in the left parietal region. These results were called by telephone at the time of interpretation on 09/27/2017 at 3:20 pm to Dr. Roland Rack , who verbally acknowledged these results. IMPRESSION: 1. No evidence of large vessel occlusion. Visualization of the medium and distal intracranial vessels is limited by bolus timing. 2. No infarct by CT perfusion. 13 cc volume of ischemic range delayed perfusion in the left parietal region, vessel size likely below vessel resolution of this CTA. 3. Equivocal focal stenosis at the origin of the upper division left M2 branch. 4. Mild atherosclerosis in the neck without flow limiting stenosis or embolic source. Electronically Signed   By: Monte Fantasia M.D.   On: 09/27/2017 15:26   Ct Angio Neck W Or Wo Contrast  Result Date: 09/27/2017 CLINICAL DATA:  Left-sided weakness EXAM: CT ANGIOGRAPHY HEAD AND NECK CT PERFUSION BRAIN TECHNIQUE: Multidetector CT imaging of the head and neck was performed using the standard protocol during bolus administration of intravenous contrast. Multiplanar CT image reconstructions and MIPs were obtained to evaluate the vascular anatomy. Carotid stenosis measurements (when applicable) are obtained utilizing NASCET criteria, using the distal internal carotid diameter as the denominator. Multiphase CT imaging of the brain was performed following IV bolus contrast injection. Subsequent parametric perfusion maps were calculated using RAPID software. CONTRAST:  11mL ISOVUE-370 IOPAMIDOL (ISOVUE-370) INJECTION 76% COMPARISON:  Noncontrast head CT from earlier today FINDINGS: CTA NECK FINDINGS Aortic arch: Atherosclerotic plaque. Three vessel branching. No acute finding or  dilatation. Right carotid system: Mild for age atheromatous plaque at the common carotid bifurcation. No stenosis, ulceration, or beading. Left carotid system: Mild for age calcified plaque at the common carotid bifurcation without stenosis or ulceration. ICA tortuosity without beading. Vertebral arteries: Proximal subclavian atherosclerosis on the left. No flow limiting stenosis. Left vertebral artery is dominant. Both vertebral arteries are smooth and widely patent to the dura. Skeleton: No acute or aggressive finding. Other neck: Negative Upper chest: Negative Review of the MIP images confirms the above findings CTA HEAD FINDINGS Anterior circulation: Symmetric small appearance of intracranial medium size vessels primarily attributed to bolus timing timing. There is atherosclerotic plaque on the carotid siphons. No major branch occlusion. Equivocal for focal stenosis at the origin of the upper division left M2 branch. There is early branching of the left MCA compared to the right, which accounts for asymmetric appearance on the MIP images. Negative for aneurysm. Posterior circulation: Dominant left vertebral artery. The distal right vertebral artery is narrow and likely atherosclerotic. Basilar is smooth and diffusely patent. Hypoplastic or aplastic right P1 segment. Symmetric PCA flow without proximal occlusion. Venous sinuses: Not visualized Anatomic variants: As above Delayed phase: Not obtained in the emergent setting Review of the MIP images confirms the above  findings CT Brain Perfusion Findings: CBF (<30%) Volume: 52mL Perfusion (Tmax>6.0s) volume: 13-centered in the left parietal region. These results were called by telephone at the time of interpretation on 09/27/2017 at 3:20 pm to Dr. Roland Rack , who verbally acknowledged these results. IMPRESSION: 1. No evidence of large vessel occlusion. Visualization of the medium and distal intracranial vessels is limited by bolus timing. 2. No infarct by CT  perfusion. 13 cc volume of ischemic range delayed perfusion in the left parietal region, vessel size likely below vessel resolution of this CTA. 3. Equivocal focal stenosis at the origin of the upper division left M2 branch. 4. Mild atherosclerosis in the neck without flow limiting stenosis or embolic source. Electronically Signed   By: Monte Fantasia M.D.   On: 09/27/2017 15:26   Mr Brain Wo Contrast  Result Date: 09/27/2017 CLINICAL DATA:  84 y/o  F; right arm weakness. EXAM: MRI HEAD WITHOUT CONTRAST TECHNIQUE: Multiplanar, multiecho pulse sequences of the brain and surrounding structures were obtained without intravenous contrast. COMPARISON:  06/25/2016 MRI head. 09/27/2016 CT head, CTA head, CT perfusion head. FINDINGS: Brain: Probable faint punctate foci of reduced diffusion in the left precentral gyrus and parietal cortex (series 5, image 59 and 66). Patchy nonspecific T2 FLAIR hyperintensities in subcortical and periventricular white matter are compatible with moderate chronic microvascular ischemic changes for age. Moderate volume loss of the brain. Small chronic lacunar infarct within the right caudate head. Numerous very small chronic infarctions within the bilateral cerebellar hemispheres. Small chronic cortical infarction in the right occipital lobe and the right frontal lobe. Increased superficial siderosis over the cerebral convexities bilaterally greatest in the frontoparietal junctions. No hydrocephalus, mass effect, or herniation. Vascular: Normal flow voids. Skull and upper cervical spine: Normal marrow signal. Sinuses/Orbits: Mild ethmoid and right maxillary sinus mucosal thickening. No significant abnormal signal of the mastoid air cells. Bilateral intra-ocular lens replacement. Other: None. IMPRESSION: 1. Few probable faint punctate acute/early subacute infarctions in left precentral gyrus and parietal cortex. 2. Moderate chronic microvascular ischemic changes and volume loss of the brain.  3. 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 Electronically Signed   By: Kristine Garbe M.D.   On: 09/27/2017 22:50   Ct Cerebral Perfusion W Contrast  Result Date: 09/27/2017 CLINICAL DATA:  Left-sided weakness EXAM: CT ANGIOGRAPHY HEAD AND NECK CT PERFUSION BRAIN TECHNIQUE: Multidetector CT imaging of the head and neck was performed using the standard protocol during bolus administration of intravenous contrast. Multiplanar CT image reconstructions and MIPs were obtained to evaluate the vascular anatomy. Carotid stenosis measurements (when applicable) are obtained utilizing NASCET criteria, using the distal internal carotid diameter as the denominator. Multiphase CT imaging of the brain was performed following IV bolus contrast injection. Subsequent parametric perfusion maps were calculated using RAPID software. CONTRAST:  157mL ISOVUE-370 IOPAMIDOL (ISOVUE-370) INJECTION 76% COMPARISON:  Noncontrast head CT from earlier today FINDINGS: CTA NECK FINDINGS Aortic arch: Atherosclerotic plaque. Three vessel branching. No acute finding or dilatation. Right carotid system: Mild for age atheromatous plaque at the common carotid bifurcation. No stenosis, ulceration, or beading. Left carotid system: Mild for age calcified plaque at the common carotid bifurcation without stenosis or ulceration. ICA tortuosity without beading. Vertebral arteries: Proximal subclavian atherosclerosis on the left. No flow limiting stenosis. Left vertebral artery is dominant. Both vertebral arteries are smooth and widely patent to the dura. Skeleton: No acute or aggressive finding. Other neck: Negative Upper chest: Negative Review of the MIP images confirms the above findings  CTA HEAD FINDINGS Anterior circulation: Symmetric small appearance of intracranial medium size vessels primarily attributed to bolus timing timing. There is atherosclerotic plaque on the carotid siphons. No major branch occlusion.  Equivocal for focal stenosis at the origin of the upper division left M2 branch. There is early branching of the left MCA compared to the right, which accounts for asymmetric appearance on the MIP images. Negative for aneurysm. Posterior circulation: Dominant left vertebral artery. The distal right vertebral artery is narrow and likely atherosclerotic. Basilar is smooth and diffusely patent. Hypoplastic or aplastic right P1 segment. Symmetric PCA flow without proximal occlusion. Venous sinuses: Not visualized Anatomic variants: As above Delayed phase: Not obtained in the emergent setting Review of the MIP images confirms the above findings CT Brain Perfusion Findings: CBF (<30%) Volume: 58mL Perfusion (Tmax>6.0s) volume: 13-centered in the left parietal region. These results were called by telephone at the time of interpretation on 09/27/2017 at 3:20 pm to Dr. Roland Rack , who verbally acknowledged these results. IMPRESSION: 1. No evidence of large vessel occlusion. Visualization of the medium and distal intracranial vessels is limited by bolus timing. 2. No infarct by CT perfusion. 13 cc volume of ischemic range delayed perfusion in the left parietal region, vessel size likely below vessel resolution of this CTA. 3. Equivocal focal stenosis at the origin of the upper division left M2 branch. 4. Mild atherosclerosis in the neck without flow limiting stenosis or embolic source. Electronically Signed   By: Monte Fantasia M.D.   On: 09/27/2017 15:26   Dg Chest Port 1 View  Result Date: 09/27/2017 CLINICAL DATA:  TIA.  No chest complaints. EXAM: PORTABLE CHEST 1 VIEW COMPARISON:  CT chest dated August 28, 2011. FINDINGS: The heart size and mediastinal contours are within normal limits. Normal pulmonary vascularity. Atherosclerotic calcification of the aortic arch. No focal consolidation, pleural effusion, or pneumothorax. No acute osseous abnormality. IMPRESSION: 1.  No active cardiopulmonary disease. 2.  Aortic  atherosclerosis (ICD10-I70.0). Electronically Signed   By: Titus Dubin M.D.   On: 09/27/2017 18:04   Ct Head Code Stroke Wo Contrast  Result Date: 09/27/2017 CLINICAL DATA:  Code stroke. New onset RIGHT-sided weakness and aphasia. EXAM: CT HEAD WITHOUT CONTRAST TECHNIQUE: Contiguous axial images were obtained from the base of the skull through the vertex without intravenous contrast. COMPARISON:  CT head 01/24/2017. FINDINGS: Brain: No acute stroke, acute hemorrhage, mass lesion, hydrocephalus, or extra-axial fluid. Generalized atrophy. Hypoattenuation of white matter, likely small vessel disease. Vascular: BILATERAL carotid atherosclerotic change, heavily calcified on the RIGHT. BILATERAL vertebral artery calcification. Asymmetric attenuation of the RIGHT M1 MCA is compared to the LEFT, see image 12 series 3, also focal hyperdensity of proximal M2 segment in the sylvian fissure is seen on series 5, image 28. While this does not correlate with clinical picture, findings are concerning for RIGHT anterior circulation large vessel occlusion. Skull: Calvarium intact. Sinuses/Orbits: No significant sinus disease.  Negative orbits. Other: None. ASPECTS [facility] Stroke Program Early CT Score) - Ganglionic level infarction (caudate, lentiform nuclei, internal capsule, insula, M1-M3 cortex): 7 - Supraganglionic infarction (M4-M6 cortex): 3 Total score (0-10 with 10 being normal): 10 IMPRESSION: 1. Atrophy and small vessel disease. No visible acute cortical infarction or hemorrhage. Concern for emergent large vessel occlusion of the RIGHT MCA, although the clinical picture does not correlate with RIGHT hemisphere ischemia. CTA is pending. 2. ASPECTS is 10. These results were called by telephone at the time of interpretation on 09/27/2017 at 2:55pm to Dr. Leonel Ramsay,  Who verbally acknowledged these results. Electronically Signed   By: Staci Righter M.D.   On: 09/27/2017 15:02        Scheduled Meds: .  stroke:  mapping our early stages of recovery book   Does not apply Once  . acidophilus  1 capsule Oral Daily  . artificial tears   Both Eyes BID  . calcium-vitamin D  1 tablet Oral Q breakfast  . cholecalciferol  5,000 Units Oral Q lunch  . gabapentin  100 mg Oral QHS  . lacosamide  100 mg Oral BID  . OXcarbazepine  150 mg Oral BID  . pantoprazole  20 mg Oral BID  . rosuvastatin  5 mg Oral q1800   Continuous Infusions: . sodium chloride 75 mL/hr at 09/28/17 0112     LOS: 0 days    Time spent: 61min    Domenic Polite, MD [facility] Page via [email or web address], password [email or web address] After 7PM please contact night-coverage  09/28/2017, 1:48 PM

## 2017-09-28 NOTE — Progress Notes (Signed)
OT Cancellation Note  Patient Details Name: Shari Vincent MRN: [id number] DOB: 08/02/1931   Cancelled Treatment:    Reason Eval/Treat Not Completed: Patient at procedure or test/ unavailable(Echo)  Merri Ray Katarina Riebe 09/28/2017, 8:01 AM  Hulda Humphrey OTR/L [phone]

## 2017-09-28 NOTE — Evaluation (Addendum)
Physical Therapy Evaluation Patient Details Name: Shari Vincent MRN: [id number] DOB: 1931-04-21 Today's Date: 09/28/2017   History of Present Illness  Patient is an 84 y/o female presenting to the ED on 09/27/17 with primary complaints of L sided weakness and numbness. Head CT was unremarkable for infarct or hemorrhage. MRI revealing Few probable faint punctate acute/early subacute infarctions in left precentral gyrus and parietal cortex. PMH significant for hyperlipidemia, trigeminal neuralgia, vertigo, arthritis, dyslipidemia, diverticulitis, cerebral amyloid angiopathy, subarachnoid hemorrhage 06/25/2016.    Clinical Impression  Shari Vincent is a very pleasant 84 y/o female admitted with the above listed diagnosis. Patient reports that prior to admission she was Mod I with mobility and ADLs. Good family support available. Patient today requiring general min guard assist for all transfers and mobility for safety. Patient with unsteady gait pattern requiring 1 HHA as well as tendency for patient to reach for handrails in hallway. Will recommend HHPT at discharge to ensure safe home navigation and improved strength/balance. PT to follow acutely.     Follow Up Recommendations Home health PT;Supervision - Intermittent    Equipment Recommendations  None recommended by PT    Recommendations for Other Services OT consult     Precautions / Restrictions Precautions Precautions: Fall Restrictions Weight Bearing Restrictions: No      Mobility  Bed Mobility Overal bed mobility: Needs Assistance Bed Mobility: Supine to Sit     Supine to sit: Min guard     General bed mobility comments: patient pulling up from PT/hand rails  Transfers Overall transfer level: Needs assistance   Transfers: Sit to/from Stand Sit to Stand: Min guard;Min assist         General transfer comment: from low bed  Ambulation/Gait Ambulation/Gait assistance: Min guard Gait Distance (Feet): 120 Feet Assistive  device: 1 person hand held assist Gait Pattern/deviations: Step-to pattern;Step-through pattern;Decreased stride length;Drifts right/left;Trunk flexed Gait velocity: decreased   General Gait Details: slight unsteadiness throughout; min guard for safety; patient reaching for handrails in hallway throughout  Stairs            Wheelchair Mobility    Modified Rankin (Stroke Patients Only)       Balance Overall balance assessment: Mild deficits observed, not formally tested                                           Pertinent Vitals/Pain Pain Assessment: No/denies pain    Home Living Family/patient expects to be discharged to:: Private residence Living Arrangements: Spouse/significant other Available Help at Discharge: Family;Available 24 hours/day Type of Home: House Home Access: Level entry     Home Layout: Two level;Laundry or work area in [place]: Kasandra Knudsen - single point;Walker - 4 wheels;Shower seat(has grab bars, but not installed)      Prior Function Level of Independence: Independent with assistive device(s)         Comments: SPC in community; used walls/furniture in house     Hand Dominance        Extremity/Trunk Assessment   Upper Extremity Assessment Upper Extremity Assessment: Defer to OT evaluation    Lower Extremity Assessment Lower Extremity Assessment: Generalized weakness    Cervical / Trunk Assessment Cervical / Trunk Assessment: Normal  Communication   Communication: No difficulties  Cognition Arousal/Alertness: Awake/alert Behavior During Therapy: WFL for tasks assessed/performed Overall Cognitive Status: Within Functional Limits for tasks assessed  General Comments      Exercises     Assessment/Plan    PT Assessment Patient needs continued PT services  PT Problem List Decreased strength;Decreased activity tolerance;Decreased  balance;Decreased mobility;Decreased knowledge of use of DME;Decreased safety awareness;Decreased knowledge of precautions       PT Treatment Interventions DME instruction;Gait training;Stair training;Functional mobility training;Therapeutic activities;Therapeutic exercise;Balance training;Neuromuscular re-education;Patient/family education    PT Goals (Current goals can be found in the Care Plan section)  Acute Rehab PT Goals Patient Stated Goal: return home PT Goal Formulation: With patient Time For Goal Achievement: 10/12/17 Potential to Achieve Goals: Good    Frequency Min 4X/week   Barriers to discharge        Co-evaluation               AM-PAC PT "6 Clicks" Daily Activity  Outcome Measure Difficulty turning over in bed (including adjusting bedclothes, sheets and blankets)?: A Little Difficulty moving from lying on back to sitting on the side of the bed? : Unable Difficulty sitting down on and standing up from a chair with arms (e.g., wheelchair, bedside commode, etc,.)?: Unable Help needed moving to and from a bed to chair (including a wheelchair)?: A Little Help needed walking in hospital room?: A Little Help needed climbing 3-5 steps with a railing? : A Little 6 Click Score: 14    End of Session Equipment Utilized During Treatment: Gait belt Activity Tolerance: Patient tolerated treatment well Patient left: in chair;with call bell/phone within reach;with chair alarm set;with family/visitor present Nurse Communication: Mobility status PT Visit Diagnosis: Unsteadiness on feet (R26.81);Other abnormalities of gait and mobility (R26.89);Muscle weakness (generalized) (M62.81)    Time: 5465-6812 PT Time Calculation (min) (ACUTE ONLY): 28 min   Charges:   PT Evaluation $PT Eval Moderate Complexity: 1 Mod PT Treatments $Gait Training: 8-22 mins        Lanney Gins, PT, DPT 09/28/17 1:59 PM Pager: [phone]

## 2017-09-28 NOTE — Progress Notes (Signed)
  Echocardiogram 2D Echocardiogram has been performed.  Jennette Dubin 09/28/2017, 9:22 AM

## 2017-09-28 NOTE — Evaluation (Signed)
Speech Language Pathology Evaluation Patient Details Name: Shari Vincent MRN: [id number] DOB: September 05, 1931 Today's Date: 09/28/2017 Time: 1601-0932 SLP Time Calculation (min) (ACUTE ONLY): 22 min  Problem List:  Patient Active Problem List   Diagnosis Date Noted  . TIA (transient ischemic attack) 09/27/2017  . Preventative health care 12/13/2016  . Urinary incontinence 10/04/2016  . Acute cerebrovascul insuff, transient focal neurologic signs/symptoms 06/27/2016  . Family hx-stroke 06/27/2016  . Cerebral amyloid angiopathy ([facility]) 06/26/2016  . 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 Subarachnoid hemorrhage ([place]) 06/25/2016  . Upper extremity weakness 06/23/2016  . Osteoporosis 10/22/2014  . Pelvic mass in female 08/25/2014  . PMB (postmenopausal bleeding) 08/06/2014  . Medicare annual wellness visit, subsequent 06/17/2014  . IBS (irritable bowel syndrome) 05/26/2014  . Right-sided low back pain with right-sided sciatica 05/26/2014  . Trigeminal neuralgia 05/26/2014  . OA (osteoarthritis) of knee 07/23/2012  . Venous stasis 07/23/2012  . COLONIC POLYPS, HYPERPLASTIC 08/14/2008  . Hyperlipidemia 08/14/2008  . GERD 08/14/2008  . VERTIGO 08/14/2008   Past Medical History:  Past Medical History:  Diagnosis Date  . Acid reflux   . Arthritis    right knee  . Bulging lumbar disc   . Cancer ([place])    Basal Cell on right shoulder  . Colon polyps   . Diverticulitis   . High cholesterol   . Irritable bowel syndrome (IBS)   . Kidney stone   . Knee pain    right  . Sciatic pain   . Subarachnoid hemorrhage ([place])   . Trigeminal neuralgia   . Trigeminal neuralgia of right side of face   . Trigeminal neuralgia syndrome   . Vertigo    Past Surgical History:  Past Surgical History:  Procedure Laterality Date  . BREAST BIOPSY Left   . BREAST SURGERY     biopsy in 1990   . CATARACT EXTRACTION    . COLONOSCOPY  05/27/2008   TFT:DDUKGURKYH rectal polyp,s/p bx/sigmoid diverticula.  hyperplastic polyps. benign random bx  . ESOPHAGOGASTRODUODENOSCOPY  05/27/2008   CWC:[facility]  . EYE SURGERY    . FOOT SURGERY     Left foot, metal plate  . HYSTEROSCOPY W/D&C N/A 09/03/2014   Procedure: DILATATION AND CURETTAGE /HYSTEROSCOPY;  Surgeon: Shari Jude, MD;  Location: [facility];  Service: Gynecology;  Laterality: N/A;  Requesting 09/03/14 @ 2:00p  . HYSTEROSCOPY W/D&C N/A 08/19/2015   Procedure: DILATATION AND CURETTAGE /HYSTEROSCOPY;  Surgeon: Shari Jude, MD;  Location: [facility];  Service: Gynecology;  Laterality: N/A;  . LITHOTRIPSY    . OTHER SURGICAL HISTORY     Uterine Prolapse/Tacking  . PROLAPSED UTERINE FIBROID LIGATION     in 1960   HPI:  Patient is an 84 y/o female presenting to the ED on 09/27/17 with primary complaints of L sided weakness and numbness. Head CT was unremarkable for infarct or hemorrhage. MRI revealing Few probable faint punctate acute/early subacute infarctions in left precentral gyrus and parietal cortex. PMH significant for hyperlipidemia, trigeminal neuralgia, vertigo, arthritis, dyslipidemia, diverticulitis, cerebral amyloid angiopathy, subarachnoid hemorrhage 06/25/2016.   Assessment / Plan / Recommendation Clinical Impression   Patient presents with moderate cognitive communication impairment with deficits seen today in immediate (4/5) and delayed recall (0/5), selective attention, simple problem solving and verbal reasoning. She scored 15/30 on MOCA-Basic (26 and above is WNL). Pt, family report cognitive deficits at baseline. She reports she stopped driving about one year ago; she still does some cooking at home. Speech is fluent without dysarthria;  no wordfinding impairment noted today though pt reports premorbid wordfinding issues. I recommend [place] SLP to maximize cognition and safety upon return to home. Pt, family in agreement. No further skilled ST needs identified; SLP will s/o.     SLP Assessment  SLP Recommendation/Assessment: All  further Speech Lanaguage Pathology  needs can be addressed in the next venue of care SLP Visit Diagnosis: Cognitive communication deficit (R41.841)    Follow Up Recommendations  Home health SLP    Frequency and Duration           SLP Evaluation Cognition  Overall Cognitive Status: History of cognitive impairments - at baseline Arousal/Alertness: Awake/alert Orientation Level: Oriented X4 Attention: Focused;Sustained;Selective Focused Attention: Appears intact Sustained Attention: Appears intact Selective Attention: Impaired Selective Attention Impairment: Verbal basic Memory: Impaired Memory Impairment: Storage deficit;Retrieval deficit;Decreased recall of new information;Decreased short term memory Decreased Short Term Memory: Verbal basic;Functional basic Awareness: Appears intact Problem Solving: Impaired Problem Solving Impairment: Verbal basic Executive Function: Reasoning Reasoning: Impaired Reasoning Impairment: Verbal basic Safety/Judgment: Appears intact       Comprehension  Auditory Comprehension Overall Auditory Comprehension: Appears within functional limits for tasks assessed Yes/No Questions: Within Functional Limits Commands: Within Functional Limits Conversation: Simple Visual Recognition/Discrimination Discrimination: Within Function Limits Reading Comprehension Reading Status: Not tested    Expression Expression Primary Mode of Expression: Verbal Verbal Expression Overall Verbal Expression: Impaired at baseline(mild wordfinding deficits (names, places)) Written Expression Dominant Hand: Right Written Expression: Not tested   Oral / Motor  Oral Motor/Sensory Function Overall Oral Motor/Sensory Function: Within functional limits Motor Speech Overall Motor Speech: Appears within functional limits for tasks assessed   [place], [place], [place]da Pathologist [phone]          [name] 09/28/2017, 5:11  PM

## 2017-09-28 NOTE — Progress Notes (Signed)
PT Cancellation Note  Patient Details Name: Shari Vincent MRN: [id number] DOB: 10-19-1931   Cancelled Treatment:    Reason Eval/Treat Not Completed: Patient at procedure or test/unavailable Echo.    Lanney Gins, PT, DPT 09/28/17 8:10 AM Pager: [phone]

## 2017-09-28 NOTE — Evaluation (Signed)
Occupational Therapy Evaluation and defer to [facility] Patient Details Name: Shari Vincent MRN: [id number] DOB: November 18, 1931 Today's Date: 09/28/2017    History of Present Illness Patient is an 84 y/o female presenting to the ED on 09/27/17 with primary complaints of L sided weakness and numbness. Head CT was unremarkable for infarct or hemorrhage. MRI revealing Few probable faint punctate acute/early subacute infarctions in left precentral gyrus and parietal cortex. PMH significant for hyperlipidemia, trigeminal neuralgia, vertigo, arthritis, dyslipidemia, diverticulitis, cerebral amyloid angiopathy, subarachnoid hemorrhage 06/25/2016.   Clinical Impression   PTA Pt mod I in ADL, IADL and mobility. Pt currently min guard to min A for ADL and mobility (1 person HHA this session). Pt has good support from husband and son. Pt will benefit from skilled OT in the home setting for balance, higher level executive thinking strategies applied to home management, and independence in ADL and IADL. OT to defer further services to the [facility] level. Thank you for the opportunity to serve this patient and her family.    Follow Up Recommendations  Home health OT    Equipment Recommendations  None recommended by OT(Pt has appropriate DME )    Recommendations for Other Services       Precautions / Restrictions Precautions Precautions: Fall Restrictions Weight Bearing Restrictions: No      Mobility Bed Mobility Overal bed mobility: Needs Assistance Bed Mobility: Supine to Sit     Supine to sit: Min guard     General bed mobility comments: patient pulling up from OT/hand rails  Transfers Overall transfer level: Needs assistance   Transfers: Sit to/from Stand Sit to Stand: Min guard;Min assist         General transfer comment: from low bed    Balance Overall balance assessment: Mild deficits observed, not formally tested                                         ADL either  performed or assessed with clinical judgement   ADL Overall ADL's : Needs assistance/impaired Eating/Feeding: Modified independent;Sitting   Grooming: Wash/dry hands;Wash/dry face;Oral care;Min guard;Standing Grooming Details (indicate cue type and reason): sink level Upper Body Bathing: Min guard;Sitting   Lower Body Bathing: Minimal assistance;Sitting/lateral leans   Upper Body Dressing : Minimal assistance;Sitting   Lower Body Dressing: Moderate assistance;Sit to/from stand   Toilet Transfer: Min guard;Minimal assistance;Ambulation Toilet Transfer Details (indicate cue type and reason): 1 person HHA Toileting- Clothing Manipulation and Hygiene: Min guard;Sitting/lateral lean       Functional mobility during ADLs: Minimal assistance(1 person HHA)       Vision Patient Visual Report: No change from baseline       Perception     Praxis      Pertinent Vitals/Pain Pain Assessment: No/denies pain     Hand Dominance Right   Extremity/Trunk Assessment Upper Extremity Assessment Upper Extremity Assessment: Generalized weakness   Lower Extremity Assessment Lower Extremity Assessment: Defer to PT evaluation   Cervical / Trunk Assessment Cervical / Trunk Assessment: Normal   Communication Communication Communication: No difficulties   Cognition Arousal/Alertness: Awake/alert Behavior During Therapy: WFL for tasks assessed/performed Overall Cognitive Status: Impaired/Different from baseline Area of Impairment: Memory                     Memory: Decreased short-term memory         General Comments: "I  just can't remember things like I used to...can you help me with that?"   General Comments  Husband present throughout session    Exercises     Shoulder Instructions      Home Living Family/patient expects to be discharged to:: Private residence Living Arrangements: Spouse/significant other Available Help at Discharge: Family;Available 24  hours/day Type of Home: House Home Access: Level entry     Home Layout: Two level;Laundry or work area in Building surveyor of Steps: 10 Alternate Level Stairs-Rails: Right Bathroom Shower/Tub: Teacher, early years/pre: Handicapped height     Home Equipment: Cane - single point;Walker - 4 wheels;Shower seat(has grab bars, but not installed)      Lives With: Spouse    Prior Functioning/Environment Level of Independence: Independent with assistive device(s)        Comments: SPC in community; used walls/furniture in house        OT Problem List: Decreased activity tolerance;Impaired balance (sitting and/or standing);Decreased cognition;Decreased safety awareness      OT Treatment/Interventions:      OT Goals(Current goals can be found in the care plan section) Acute Rehab OT Goals Patient Stated Goal: return home OT Goal Formulation: With patient/family Time For Goal Achievement: 10/12/17 Potential to Achieve Goals: Good  OT Frequency:     Barriers to D/C:            Co-evaluation              AM-PAC PT "6 Clicks" Daily Activity     Outcome Measure Help from another person eating meals?: None Help from another person taking care of personal grooming?: A Little Help from another person toileting, which includes using toliet, bedpan, or urinal?: A Little Help from another person bathing (including washing, rinsing, drying)?: A Little Help from another person to put on and taking off regular upper body clothing?: A Little Help from another person to put on and taking off regular lower body clothing?: A Little 6 Click Score: 19   End of Session Equipment Utilized During Treatment: Gait belt Nurse Communication: Mobility status  Activity Tolerance: Patient tolerated treatment well Patient left: in bed;with call bell/phone within reach;with bed alarm set;with family/visitor present  OT Visit Diagnosis: Unsteadiness on feet  (R26.81);Other abnormalities of gait and mobility (R26.89);Muscle weakness (generalized) (M62.81);Other symptoms and signs involving cognitive function                Time: 4132-4401 OT Time Calculation (min): 29 min Charges:  OT General Charges $OT Visit: 1 Visit OT Evaluation $OT Eval Moderate Complexity: 1 Mod  Hulda Humphrey OTR/L [place] 09/28/2017, 5:26 PM

## 2017-09-28 NOTE — Procedures (Signed)
History: 84 yo F with transient speech difficulty  Sedation: None  Technique: This is a 21 channel routine scalp EEG performed at the bedside with bipolar and monopolar montages arranged in accordance to the international 10/20 system of electrode placement. One channel was dedicated to EKG recording.    Background: The background consists of intermixed alpha and beta activities. There is a well defined posterior dominant rhythm of 8 Hz that attenuates with eye opening. Sleep is not recorded, but there is anterior shifting of the PDR with drowsiness.   Photic stimulation: Physiologic driving is not performed  EEG Abnormalities: None  Clinical Interpretation: This normal EEG is recorded in the waking and drowsy state. There was no seizure or seizure predisposition recorded on this study. Please note that a normal EEG does not preclude the possibility of epilepsy.   Roland Rack, MD [facility] [phone]  If 7pm- 7am, please page neurology on call as listed in [place].

## 2017-09-28 NOTE — Progress Notes (Addendum)
NEUROHOSPITALISTS STROKE TEAM - DAILY PROGRESS NOTE    HISTORY 84 y.o. female with a past medical history of hyperlipidemia, trigeminal neuralgia, diverticulitis, cerebral amyloid angiopathy, subarachnoid hemorrhage secondary with symptoms of left-sided numbness and weakness which has since resolved who presents to the ED today with complaints of acute right arm weakness, numbness and tingling while shopping with family around 1 PM today.  After being seen by triage, patient became dysarthric with aphasia and minimal ability to follow commands.  About 10 minutes after CT head was performed, patient symptoms gradually improved, she was more lucid, able to give HPI and review of systems, follow commands equally with NIH score dropping from 17 to 1.  Daughter states patient mentation is back to her baseline but word finding difficulty and expressive aphasia is worse than her norm.  LSN: 09/27/17 1pm tPA Given:No - NO LVO and symptoms almost resolved about 10 minutes after getting out of CT NIHSS: 17 initially   NIHSS 1 - 30 minutes later   Baseline Premorbid modified Rankin scale (mRS):1  SUBJECTIVE Patient in bed with family at bedside.  Patient denies headache, dizziness, ocular pain, nausea, vomiting.  Patient admits to improvement of symptoms since yesterday, states she is more lucid.  States today is her anniversary.  She had a lot of very appropriate questions which were answered. OBJECTIVE Most recent Vital Signs: Vitals:   09/28/17 0307 09/28/17 0323 09/28/17 0419 09/28/17 0930  BP: 123/69 132/78  98/68  Pulse: 67 65  68  Resp: (!) 21 13  14   Temp: 98.6 F (37 C) 98.6 F (37 C) 98.6 F (37 C) 98.4 F (36.9 C)  TempSrc: Oral Oral  Oral  SpO2: 98% 98%  100%  Weight:      Height:       CBG (last 3)  Recent Labs    09/27/17 1432  GLUCAP 81    Physical Exam  HEENT-  Normocephalic, no lesions, without obvious abnormality.   Normal external eye and conjunctiva.   Cardiovascular- S1-S2 audible, pulses palpable throughout   Lungs-no rhonchi or wheezing noted, no excessive working breathing.  Saturations within normal limits Abdomen- All 4 quadrants palpated and nontender Musculoskeletal-no joint tenderness, deformity or swelling Skin-warm and dry,  Neuro:  Mental Status: Alert, oriented, thought content appropriate.  Speech fluent without evidence of aphasia, naming, recall and repetition improved since yesterday.  Able to follow 3 step commands without difficulty. Cranial Nerves: II:  Visual fields grossly normal,  III,IV, VI: ptosis not present, extra-ocular motions intact bilaterally pupils equal, round, reactive to light and accommodation V,VII: smile symmetric, facial light touch sensation normal bilaterally VIII: hearing intact to voice IX,X: uvula rises symmetrically XI: bilateral shoulder shrug XII: midline tongue extension Motor: Moves all extremities equally without deficits Right : Upper extremity   5/5    Left:     Upper extremity   5/5  Lower extremity   5/5     Lower extremity   5/5 Tone and bulk:normal tone throughout; no atrophy noted Sensory: Pinprick and light touch intact throughout, bilaterally Deep Tendon Reflexes: 2+ and symmetric throughout Plantars: Right: downgoing   Left: downgoing Cerebellar: normal finger-to-nose, slightly impaired heel-to-shin test  due to patient's bilateral knee pain Gait: Not tested secondary to safety  IV Fluid Intake:   . sodium chloride 75 mL/hr at 09/28/17 0112    MEDICATIONS  .  stroke: mapping our early stages of recovery book   Does not apply Once  . acidophilus  1 capsule Oral Daily  . artificial tears   Both Eyes BID  . atorvastatin  10 mg Oral q1800  . calcium-vitamin D  1 tablet Oral Q breakfast  . cholecalciferol  5,000 Units Oral Q lunch  . gabapentin  100 mg Oral QHS  . lacosamide  100 mg Oral BID  . pantoprazole  20 mg Oral BID    PRN:  acetaminophen **OR** acetaminophen (TYLENOL) oral liquid 160 mg/5 mL **OR** acetaminophen, hyoscyamine, senna-docusate  Diet:   Diet Order            Diet Heart Room service appropriate? Yes; Fluid consistency: Thin  Diet effective now               CLINICALLY SIGNIFICANT STUDIES Basic Metabolic Panel:  Recent Labs  Lab 09/27/17 1408 09/27/17 1518  NA 140 139  K 4.3 4.8  CL 105 105  CO2 25  --   GLUCOSE 91 82  BUN 12 17  CREATININE 0.94 0.90  CALCIUM 9.5  --    Liver Function Tests:  Recent Labs  Lab 09/27/17 1408  AST 22  ALT 7  ALKPHOS 64  BILITOT 0.6  PROT 7.0  ALBUMIN 4.0   CBC:  Recent Labs  Lab 09/27/17 1408 09/27/17 1518  WBC 8.4  --   NEUTROABS 4.6  --   HGB 13.1 13.9  HCT 40.2 41.0  MCV 93.1  --   PLT 181  --    Coagulation:  Recent Labs  Lab 09/27/17 1408  LABPROT 12.3  INR 0.92   Cardiac Enzymes: No results for input(s): CKTOTAL, CKMB, CKMBINDEX, TROPONINI in the last 168 hours. Urinalysis: No results for input(s): COLORURINE, LABSPEC, PHURINE, GLUCOSEU, HGBUR, BILIRUBINUR, KETONESUR, PROTEINUR, UROBILINOGEN, NITRITE, LEUKOCYTESUR in the last 168 hours.  Invalid input(s): APPERANCEUR Lipid Panel    Component Value Date/Time   CHOL 171 09/28/2017 0414   TRIG 184 (H) 09/28/2017 0414   HDL 32 (L) 09/28/2017 0414   CHOLHDL 5.3 09/28/2017 0414   VLDL 37 09/28/2017 0414   LDLCALC 102 (H) 09/28/2017 0414   HgbA1C  Lab Results  Component Value Date   HGBA1C 5.6 09/28/2017    Urine Drug Screen:      Component Value Date/Time   LABOPIA NONE DETECTED 06/25/2016 1200   COCAINSCRNUR NONE DETECTED 06/25/2016 1200   LABBENZ NONE DETECTED 06/25/2016 1200   AMPHETMU NONE DETECTED 06/25/2016 1200   THCU NONE DETECTED 06/25/2016 1200   LABBARB NONE DETECTED 06/25/2016 1200    Alcohol Level: No results for input(s): ETH in the last 168 hours.  Ct Angio Head/Neck W Or Wo Contrast/CT Perfusion 09/27/2017 IMPRESSION:  1. No evidence  of large vessel occlusion. Visualization of the medium and distal intracranial vessels is limited by bolus timing.  2. No infarct by CT perfusion. 13 cc volume of ischemic range delayed perfusion in the left parietal region, vessel size likely below vessel resolution of this CTA.  3. Equivocal focal stenosis at the origin of the upper division left M2 branch.  4. Mild atherosclerosis in the neck without flow limiting stenosis or embolic source.   Mr Brain Wo Contrast 09/27/2017 IMPRESSION:  1. Few probable faint punctate acute/early subacute  infarctions in left precentral gyrus and parietal cortex.  2. Moderate chronic microvascular ischemic changes and volume loss of the brain.  3. 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   Dg Chest Port 1 View  09/27/2017 IMPRESSION:  1.  No active cardiopulmonary disease.  2.  Aortic atherosclerosis (ICD10-I70.0).   Ct Head Code Stroke Wo Contrast  09/27/2017 IMPRESSION:  1. Atrophy and small vessel disease. No visible acute cortical infarction or hemorrhage. Concern for emergent large vessel occlusion of the RIGHT MCA, although the clinical picture does not correlate with RIGHT hemisphere ischemia. CTA is pending.  2. ASPECTS is 10.    EKG  normal EKG, normal sinus rhythm, LBBB. For complete results please see formal report.   Outstanding Stroke Work-up Studies:     Echocardiogram:                                                    PENDING EEG:                                                                      PENDING  ASSESSMENT/PLAN Transient focal neurological episodes and patient with cerebral amyloid angiopathy with hx of previous [place] who presented with waxing and waning symptoms of acute right arm weakness, numbness tingling aphasia, expressive aphasia, confused speech with minimal ability to follow commands which resolved 30 minutes after arrival to hospital  Code Stroke: On arrival to the ED,   CTA head & neck  :No evidence of large vessel occlusion.   CTA Perfusion:  No infarct, left parietal region ischemia   CT of the brain: Atrophy and small vessel disease. No acute infarction or hemorrhage   MRI of the brain: Few probable faint punctate acute/early subacute infarctions in left precentral gyrus and parietal cortex.  Increased superficial siderosis since 2018   2D Echocardiogram  Pending **  EEG: pending **  CXR : No active cardiopulmonary disease.   LDL: 102  HgbA1c 5.6  VTE: SCD  Antiplatelets : No antiplatelets in the setting of amyloid angiopathy in order to avoid intracerebral hemorrhage  Patient currently takes Vimpat at home, will add Trileptal 150 mg p.o. twice daily  Atorvastatin changed to Crestor, patient states had previous history of bilateral lower extremity cramping associated with statin  Continue Rehab with PT consult, OT consult, Speech consult  Therapy recommendations:  Pending  Disposition:   With family  Follow-up outpatient with Dr. Lavell Anchors in 4 to 6 weeks (has a scheduled appointment mid-September)  Cerebral Amyloid Angiopathy  Continue Vimpat and will be started on Trileptal today  seizure precautions   Hypertension  Has been hypotensive to SBP 120s to 130s  Maintain goal <140  Shin currently not on any antihypertensives at home, instructed to continue monitoring BP closely   Hyperlipidemia  Home meds:   None  LDL 102 goal < 70  Patient states had previously taken Lipitor with some leg cramping but agreeable to trialing another statin at this time and start Crestor 5 mg p.o. daily   Continue statin at discharge   Other Stroke  Risk Factors  Advanced age  Hx of Cerebral Amyloid Angiopathy   Other Active Problems  GERD   Hospital day # 0  SIGNED [facility] Neuro-hospitalist Team [phone] 09/28/2017, 11:50 AM   09/28/2017 ATTENDING ASSESSMENT   ATTENDING NOTE: I reviewed above note and agree with the assessment and  plan. I have made any additions or clarifications directly to the above note. Pt was seen and examined.   84 year old female with history of hyperlipidemia, vertigo, and SAH admitted for episode of right arm tingling numbness weakness followed by confusion, agitation and altered mental status with possible expressive aphasia.  In 06/2016 patient was admitted for left facial droop and left sided numbness weakness.  CT and MRI showed increased right frontal SAH with left superficial hemosiderosis, concerning for CAA.  MRA negative.  Carotid Doppler and 2D echo unremarkable.  EEG mild diffuse slowing, no seizure.  LDL 115 and A1c 5.9.  Patient episode was considered focal transient neurological episode (FTNEs) and was started on Keppra.   However, patient not able to tolerating Keppra due to drowsiness, therefore it was changed to Vimpat 100 mg twice daily along with her long-term Trileptal 150 mg daily at bedtime.  In 01/2017, she had repeat EEG showed negative for seizure.  Trileptal discontinued, and the Vimpat down to 50 mg twice daily.  However, for the last 1 week, patient started to have on and off right arm weakness, numbness and tingling.  Her Vimpat was increased to 100 mg twice daily. Yesterday had episode of confusion, agitation, with not following commands and possible expressive aphasia lasting 15 minutes.  CT no acute abnormalities, no SAH.  CTA head neck showed right V4 and left M2 stenosis, bilateral siphon atherosclerosis MRI showed increased bilateral superficial hemosiderosis, progressed from 06/2016, consistent with CAA.  There was 1 faint punctate DWI signal at left precentral gyrus which not convinced for infarct.  EF 55 to 60%.  EEG normal.  LDL 104, and A1c 5.6.  Patient episode still considered as focal transient neurological episode (FTNEs) in the setting of progression of CAA confirmed on MRI.  Recommend to continue Vimpat 100 mg twice daily and restart Trileptal 150 mg twice daily.   Avoid antiplatelet or anticoagulation due to CAA.  Patient had history of statin intolerance, she preferred to discuss with PCP before starting any statin at this time.   Neurology will sign off. Please call with questions. Pt will follow up with Dr. Jaynee Eagles at [facility] in about 4 weeks. Thanks for the consult.   Rosalin Hawking, MD PhD Stroke Neurology 09/28/2017 6:23 PM   I spent  35 minutes in total face-to-face time with the patient, more than 50% of which was spent in counseling and coordination of care, reviewing test results, images and medication, and discussing the diagnosis of CAA and FTNEs, treatment plan and potential prognosis. This patient's care requiresreview of multiple databases, neurological assessment, discussion with family, other specialists and medical decision making of high complexity.  I have reviewed extensively her medical records for her hospitalization and outpatient clinic visits.  Discussed with Dr. Broadus John.    To contact Stroke Continuity provider, please refer to [email or web address] After hours, contact General Neurology

## 2017-09-29 DIAGNOSIS — G459 Transient cerebral ischemic attack, unspecified: Secondary | ICD-10-CM | POA: Diagnosis not present

## 2017-09-29 MED ORDER — OXCARBAZEPINE 150 MG PO TABS: 150.0000 mg | ORAL_TABLET | Freq: Two times a day (BID) | ORAL | 0 refills | Status: DC

## 2017-09-29 NOTE — Discharge Summary (Signed)
Physician Discharge Summary  YANNIS GUMBS WHQ:[id number] DOB: 1931-10-01 DOA: 09/27/2017  PCP: Pleas Koch, NP  Admit date: 09/27/2017 Discharge date: 09/29/2017  Time spent: 35 minutes  Recommendations for Outpatient Follow-up:  1. Neurology Dr.Ahern in 4 weeks 2. PCP in one week   Discharge Diagnoses:    Cerebral amyloid angiopathy   Focal transient neurological episodes   History of subarachnoid hemorrhage   Hyperlipidemia   GERD   VERTIGO   Subarachnoid hemorrhage (HCC)   Cerebral amyloid angiopathy (CODE)   Discharge Condition: stable  Diet recommendation: low sodium heart healthy  Filed Weights   09/27/17 1428  Weight: 76 kg    History of present illness:  VANESSIA BOKHARI a 84 y.o.femalewith medical history significant ofhyperlipidemia, trigeminal neuralgia, vertigo, arthritis, dyslipidemia, diverticulitis, cerebral amyloid angiopathy, subarachnoid hemorrhage 06/25/2016 who presents with symptoms of left-sided numbness and weakness which is since resolved. Patient was out shopping and developed some right arm weakness numbness and tingling. -Subsequently brought to the ED, her symptoms resolved and while in the emergency room developed aphagia  Hospital Course:    Cerebral amyloid angiopathy with Focal transient neurological episodes  -back in 06/2016 admitted with left facial droop and left-sided weakness MRI showed right frontal subarachnoid hemorrhage with hemosiderosis concerning for cerebral amyloid angiopathy, EEG showed mild slowing patient's episode was considered FTNE and was started on Keppra however did not tolerate this due to drowsiness, changed over to Vimpat 100 mg twice a day along with long-term Trileptal, in 01/2017 repeat EEG was negative Trileptal was discontinued and Vimpat was cut down to 50 mg twice a day, however in the past week she was having right arm weakness numbness and tingling hence Vimpat dose increased back to her milligrams  twice a day, day of admission had episode of confusion and agitation, not following commands, this was followed by right arm numbness and tingling which resolved and then followed by expressive aphagia for 15 minutes. -MRI noted faint punctate acute/early subacute infarctions in left precentral gyrus and parietal cortex, per neurology this is more consistent with cerebral amyloid angiopathy and not convincing for infarct -EEG was repeated and normal -neurology Dr.Xu felt that patient's episode is still considered a focal transient neurological episode (LFT and knee) in the setting of progression of cerebral amyloid angiopathy confirmed on MRI and recommended to continue Vimpat 100 mg twice a day and to restart Trileptal 150 mg twice a day. -not on any antiplatelets at baseline due to history of amyloid angiopathy and subarachnoid hemorrhage in 06/2016 -Symptoms have resolved at this time -PT OT evaluations completed home health PT OT recommended this was set up prior to discharge -She is advised to follow-up with her primary neurologist Dr.Ahern at [facility] in 4 weeks  Dyslipidemia -Has not been on statins due to myalgias and abdominal discomfort -follow-up with PCP  Cerebral amyloid angiopathy and history of subarachnoid hemorrhage in 06/2016 -Monitor off antiplatelets -as above  GERD -Continue PPI  Procedures: EEG 8/9 negative for epileptiform discharges  Consultations:  Neurology  Discharge Exam: Vitals:   09/29/17 0414 09/29/17 0824  BP: (!) 123/56 (!) 130/53  Pulse: (!) 59 66  Resp: 18   Temp: 98.2 F (36.8 C) 97.8 F (36.6 C)  SpO2: 95% 94%    General: alert awake oriented 3 Cardiovascular: as well as 2/regular rate rhythm Respiratory: clear bilaterally  Discharge Instructions   Discharge Instructions    Ambulatory referral to Neurology   Complete by:  As directed  An appointment is requested in approximately: 4 weeks   Diet - low sodium heart healthy   Complete  by:  As directed    Increase activity slowly   Complete by:  As directed      Allergies as of 09/29/2017      Reactions   Albuterol Rash   Cefdinir Diarrhea   Lactose Intolerance (gi) Nausea And Vomiting, Other (See Comments)   Patient has IBS   Levofloxacin Nausea Only   Other Nausea And Vomiting   No greasy foods!!   Adhesive [tape] Rash   Paper tape is tolerated   Epinephrine Anxiety, Palpitations   Latex Rash      Medication List    TAKE these medications   acetaminophen 325 MG tablet Commonly known as:  TYLENOL Take 325 mg by mouth every 6 (six) hours as needed (headaches or pain).   BLUE-EMU SUPER STRENGTH Crea Apply 1 application topically 3 (three) times daily as needed (for knee and back pain).   calcium citrate-vitamin D 315-200 MG-UNIT tablet Commonly known as:  CITRACAL+D Take 1 tablet by mouth daily with lunch. Reported on 08/09/2015   D3 MAXIMUM STRENGTH 5000 units capsule Generic drug:  Cholecalciferol Take 5,000 Units by mouth daily with lunch.   DIGESTIVE ADVANTAGE GUMMIES Chew Chew 1 tablet by mouth daily.   gabapentin 100 MG capsule Commonly known as:  NEURONTIN TAKE 1 CAPSULE BY MOUTH ONCE DAILY AT BEDTIME What changed:  See the new instructions.   hyoscyamine 0.125 MG tablet Commonly known as:  LEVSIN, ANASPAZ Take 0.125 mg by mouth every 4 (four) hours as needed (ibs/stomach pain).   Lacosamide 100 MG Tabs Take 1 tablet (100 mg total) by mouth 2 (two) times daily. Please call for an appt. [phone] What changed:  additional instructions   meclizine 25 MG tablet Commonly known as:  ANTIVERT Take 25 mg by mouth 3 (three) times daily as needed for dizziness (vertigo). Reported on 05/13/2015   OXcarbazepine 150 MG tablet Commonly known as:  TRILEPTAL Take 1 tablet (150 mg total) by mouth 2 (two) times daily.   pantoprazole 20 MG tablet Commonly known as:  PROTONIX TAKE 1 TABLET BY MOUTH TWICE DAILY   SYSTANE OP Place 1 drop into  both eyes 2 (two) times daily.      Allergies  Allergen Reactions  . Albuterol Rash  . Cefdinir Diarrhea  . Lactose Intolerance (Gi) Nausea And Vomiting and Other (See Comments)    Patient has IBS  . Levofloxacin Nausea Only  . Other Nausea And Vomiting    No greasy foods!!  . Adhesive [Tape] Rash    Paper tape is tolerated  . Epinephrine Anxiety and Palpitations  . Latex Rash   Follow-up Information    Melvenia Beam, MD. Schedule an appointment as soon as possible for a visit in 4 week(s).   Specialty:  Neurology Contact information: [place] [address] [phone]        Pleas Koch, NP. Schedule an appointment as soon as possible for a visit in 1 week(s).   Specialty:  Internal Medicine Contact information: [place] [address] [phone]        [facility] Follow up.   Specialty:  [place] Why:  [place], [place] Contact information: [place] [address] [phone]            The results of significant diagnostics from this  hospitalization (including imaging, microbiology, ancillary and laboratory) are listed below for reference.    Significant Diagnostic Studies: Ct Angio Head W Or Wo Contrast  Result Date: 09/27/2017 CLINICAL DATA:  Left-sided weakness EXAM: CT ANGIOGRAPHY HEAD AND NECK CT PERFUSION BRAIN TECHNIQUE: Multidetector CT imaging of the head and neck was performed using the standard protocol during bolus administration of intravenous contrast. Multiplanar CT image reconstructions and MIPs were obtained to evaluate the vascular anatomy. Carotid stenosis measurements (when applicable) are obtained utilizing NASCET criteria, using the distal internal carotid diameter as the denominator. Multiphase CT imaging of the brain was performed following IV bolus contrast injection. Subsequent parametric perfusion maps were calculated using RAPID software.  CONTRAST:  173mL ISOVUE-370 IOPAMIDOL (ISOVUE-370) INJECTION 76% COMPARISON:  Noncontrast head CT from earlier today FINDINGS: CTA NECK FINDINGS Aortic arch: Atherosclerotic plaque. Three vessel branching. No acute finding or dilatation. Right carotid system: Mild for age atheromatous plaque at the common carotid bifurcation. No stenosis, ulceration, or beading. Left carotid system: Mild for age calcified plaque at the common carotid bifurcation without stenosis or ulceration. ICA tortuosity without beading. Vertebral arteries: Proximal subclavian atherosclerosis on the left. No flow limiting stenosis. Left vertebral artery is dominant. Both vertebral arteries are smooth and widely patent to the dura. Skeleton: No acute or aggressive finding. Other neck: Negative Upper chest: Negative Review of the MIP images confirms the above findings CTA HEAD FINDINGS Anterior circulation: Symmetric small appearance of intracranial medium size vessels primarily attributed to bolus timing timing. There is atherosclerotic plaque on the carotid siphons. No major branch occlusion. Equivocal for focal stenosis at the origin of the upper division left M2 branch. There is early branching of the left MCA compared to the right, which accounts for asymmetric appearance on the MIP images. Negative for aneurysm. Posterior circulation: Dominant left vertebral artery. The distal right vertebral artery is narrow and likely atherosclerotic. Basilar is smooth and diffusely patent. Hypoplastic or aplastic right P1 segment. Symmetric PCA flow without proximal occlusion. Venous sinuses: Not visualized Anatomic variants: As above Delayed phase: Not obtained in the emergent setting Review of the MIP images confirms the above findings CT Brain Perfusion Findings: CBF (<30%) Volume: 57mL Perfusion (Tmax>6.0s) volume: 13-centered in the left parietal region. These results were called by telephone at the time of interpretation on 09/27/2017 at 3:20 pm to Dr.  Roland Rack , who verbally acknowledged these results. IMPRESSION: 1. No evidence of large vessel occlusion. Visualization of the medium and distal intracranial vessels is limited by bolus timing. 2. No infarct by CT perfusion. 13 cc volume of ischemic range delayed perfusion in the left parietal region, vessel size likely below vessel resolution of this CTA. 3. Equivocal focal stenosis at the origin of the upper division left M2 branch. 4. Mild atherosclerosis in the neck without flow limiting stenosis or embolic source. Electronically Signed   By: Monte Fantasia M.D.   On: 09/27/2017 15:26   Ct Angio Neck W Or Wo Contrast  Result Date: 09/27/2017 CLINICAL DATA:  Left-sided weakness EXAM: CT ANGIOGRAPHY HEAD AND NECK CT PERFUSION BRAIN TECHNIQUE: Multidetector CT imaging of the head and neck was performed using the standard protocol during bolus administration of intravenous contrast. Multiplanar CT image reconstructions and MIPs were obtained to evaluate the vascular anatomy. Carotid stenosis measurements (when applicable) are obtained utilizing NASCET criteria, using the distal internal carotid diameter as the denominator. Multiphase CT imaging of the brain was performed following IV bolus contrast injection. Subsequent parametric perfusion maps were calculated  using RAPID software. CONTRAST:  170mL ISOVUE-370 IOPAMIDOL (ISOVUE-370) INJECTION 76% COMPARISON:  Noncontrast head CT from earlier today FINDINGS: CTA NECK FINDINGS Aortic arch: Atherosclerotic plaque. Three vessel branching. No acute finding or dilatation. Right carotid system: Mild for age atheromatous plaque at the common carotid bifurcation. No stenosis, ulceration, or beading. Left carotid system: Mild for age calcified plaque at the common carotid bifurcation without stenosis or ulceration. ICA tortuosity without beading. Vertebral arteries: Proximal subclavian atherosclerosis on the left. No flow limiting stenosis. Left vertebral  artery is dominant. Both vertebral arteries are smooth and widely patent to the dura. Skeleton: No acute or aggressive finding. Other neck: Negative Upper chest: Negative Review of the MIP images confirms the above findings CTA HEAD FINDINGS Anterior circulation: Symmetric small appearance of intracranial medium size vessels primarily attributed to bolus timing timing. There is atherosclerotic plaque on the carotid siphons. No major branch occlusion. Equivocal for focal stenosis at the origin of the upper division left M2 branch. There is early branching of the left MCA compared to the right, which accounts for asymmetric appearance on the MIP images. Negative for aneurysm. Posterior circulation: Dominant left vertebral artery. The distal right vertebral artery is narrow and likely atherosclerotic. Basilar is smooth and diffusely patent. Hypoplastic or aplastic right P1 segment. Symmetric PCA flow without proximal occlusion. Venous sinuses: Not visualized Anatomic variants: As above Delayed phase: Not obtained in the emergent setting Review of the MIP images confirms the above findings CT Brain Perfusion Findings: CBF (<30%) Volume: 17mL Perfusion (Tmax>6.0s) volume: 13-centered in the left parietal region. These results were called by telephone at the time of interpretation on 09/27/2017 at 3:20 pm to Dr. Roland Rack , who verbally acknowledged these results. IMPRESSION: 1. No evidence of large vessel occlusion. Visualization of the medium and distal intracranial vessels is limited by bolus timing. 2. No infarct by CT perfusion. 13 cc volume of ischemic range delayed perfusion in the left parietal region, vessel size likely below vessel resolution of this CTA. 3. Equivocal focal stenosis at the origin of the upper division left M2 branch. 4. Mild atherosclerosis in the neck without flow limiting stenosis or embolic source. Electronically Signed   By: Monte Fantasia M.D.   On: 09/27/2017 15:26   Mr Brain Wo  Contrast  Result Date: 09/27/2017 CLINICAL DATA:  84 y/o  F; right arm weakness. EXAM: MRI HEAD WITHOUT CONTRAST TECHNIQUE: Multiplanar, multiecho pulse sequences of the brain and surrounding structures were obtained without intravenous contrast. COMPARISON:  06/25/2016 MRI head. 09/27/2016 CT head, CTA head, CT perfusion head. FINDINGS: Brain: Probable faint punctate foci of reduced diffusion in the left precentral gyrus and parietal cortex (series 5, image 59 and 66). Patchy nonspecific T2 FLAIR hyperintensities in subcortical and periventricular white matter are compatible with moderate chronic microvascular ischemic changes for age. Moderate volume loss of the brain. Small chronic lacunar infarct within the right caudate head. Numerous very small chronic infarctions within the bilateral cerebellar hemispheres. Small chronic cortical infarction in the right occipital lobe and the right frontal lobe. Increased superficial siderosis over the cerebral convexities bilaterally greatest in the frontoparietal junctions. No hydrocephalus, mass effect, or herniation. Vascular: Normal flow voids. Skull and upper cervical spine: Normal marrow signal. Sinuses/Orbits: Mild ethmoid and right maxillary sinus mucosal thickening. No significant abnormal signal of the mastoid air cells. Bilateral intra-ocular lens replacement. Other: None. IMPRESSION: 1. Few probable faint punctate acute/early subacute infarctions in left precentral gyrus and parietal cortex. 2. Moderate chronic microvascular ischemic changes  and volume loss of the brain. 3. 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 Electronically Signed   By: Kristine Garbe M.D.   On: 09/27/2017 22:50   Ct Cerebral Perfusion W Contrast  Result Date: 09/27/2017 CLINICAL DATA:  Left-sided weakness EXAM: CT ANGIOGRAPHY HEAD AND NECK CT PERFUSION BRAIN TECHNIQUE: Multidetector CT imaging of the head and neck was performed using the  standard protocol during bolus administration of intravenous contrast. Multiplanar CT image reconstructions and MIPs were obtained to evaluate the vascular anatomy. Carotid stenosis measurements (when applicable) are obtained utilizing NASCET criteria, using the distal internal carotid diameter as the denominator. Multiphase CT imaging of the brain was performed following IV bolus contrast injection. Subsequent parametric perfusion maps were calculated using RAPID software. CONTRAST:  163mL ISOVUE-370 IOPAMIDOL (ISOVUE-370) INJECTION 76% COMPARISON:  Noncontrast head CT from earlier today FINDINGS: CTA NECK FINDINGS Aortic arch: Atherosclerotic plaque. Three vessel branching. No acute finding or dilatation. Right carotid system: Mild for age atheromatous plaque at the common carotid bifurcation. No stenosis, ulceration, or beading. Left carotid system: Mild for age calcified plaque at the common carotid bifurcation without stenosis or ulceration. ICA tortuosity without beading. Vertebral arteries: Proximal subclavian atherosclerosis on the left. No flow limiting stenosis. Left vertebral artery is dominant. Both vertebral arteries are smooth and widely patent to the dura. Skeleton: No acute or aggressive finding. Other neck: Negative Upper chest: Negative Review of the MIP images confirms the above findings CTA HEAD FINDINGS Anterior circulation: Symmetric small appearance of intracranial medium size vessels primarily attributed to bolus timing timing. There is atherosclerotic plaque on the carotid siphons. No major branch occlusion. Equivocal for focal stenosis at the origin of the upper division left M2 branch. There is early branching of the left MCA compared to the right, which accounts for asymmetric appearance on the MIP images. Negative for aneurysm. Posterior circulation: Dominant left vertebral artery. The distal right vertebral artery is narrow and likely atherosclerotic. Basilar is smooth and diffusely  patent. Hypoplastic or aplastic right P1 segment. Symmetric PCA flow without proximal occlusion. Venous sinuses: Not visualized Anatomic variants: As above Delayed phase: Not obtained in the emergent setting Review of the MIP images confirms the above findings CT Brain Perfusion Findings: CBF (<30%) Volume: 36mL Perfusion (Tmax>6.0s) volume: 13-centered in the left parietal region. These results were called by telephone at the time of interpretation on 09/27/2017 at 3:20 pm to Dr. Roland Rack , who verbally acknowledged these results. IMPRESSION: 1. No evidence of large vessel occlusion. Visualization of the medium and distal intracranial vessels is limited by bolus timing. 2. No infarct by CT perfusion. 13 cc volume of ischemic range delayed perfusion in the left parietal region, vessel size likely below vessel resolution of this CTA. 3. Equivocal focal stenosis at the origin of the upper division left M2 branch. 4. Mild atherosclerosis in the neck without flow limiting stenosis or embolic source. Electronically Signed   By: Monte Fantasia M.D.   On: 09/27/2017 15:26   Dg Chest Port 1 View  Result Date: 09/27/2017 CLINICAL DATA:  TIA.  No chest complaints. EXAM: PORTABLE CHEST 1 VIEW COMPARISON:  CT chest dated August 28, 2011. FINDINGS: The heart size and mediastinal contours are within normal limits. Normal pulmonary vascularity. Atherosclerotic calcification of the aortic arch. No focal consolidation, pleural effusion, or pneumothorax. No acute osseous abnormality. IMPRESSION: 1.  No active cardiopulmonary disease. 2.  Aortic atherosclerosis (ICD10-I70.0). Electronically Signed   By: Titus Dubin M.D.   On:  09/27/2017 18:04   Ct Head Code Stroke Wo Contrast  Result Date: 09/27/2017 CLINICAL DATA:  Code stroke. New onset RIGHT-sided weakness and aphasia. EXAM: CT HEAD WITHOUT CONTRAST TECHNIQUE: Contiguous axial images were obtained from the base of the skull through the vertex without intravenous  contrast. COMPARISON:  CT head 01/24/2017. FINDINGS: Brain: No acute stroke, acute hemorrhage, mass lesion, hydrocephalus, or extra-axial fluid. Generalized atrophy. Hypoattenuation of white matter, likely small vessel disease. Vascular: BILATERAL carotid atherosclerotic change, heavily calcified on the RIGHT. BILATERAL vertebral artery calcification. Asymmetric attenuation of the RIGHT M1 MCA is compared to the LEFT, see image 12 series 3, also focal hyperdensity of proximal M2 segment in the sylvian fissure is seen on series 5, image 28. While this does not correlate with clinical picture, findings are concerning for RIGHT anterior circulation large vessel occlusion. Skull: Calvarium intact. Sinuses/Orbits: No significant sinus disease.  Negative orbits. Other: None. ASPECTS [facility] Stroke Program Early CT Score) - Ganglionic level infarction (caudate, lentiform nuclei, internal capsule, insula, M1-M3 cortex): 7 - Supraganglionic infarction (M4-M6 cortex): 3 Total score (0-10 with 10 being normal): 10 IMPRESSION: 1. Atrophy and small vessel disease. No visible acute cortical infarction or hemorrhage. Concern for emergent large vessel occlusion of the RIGHT MCA, although the clinical picture does not correlate with RIGHT hemisphere ischemia. CTA is pending. 2. ASPECTS is 10. These results were called by telephone at the time of interpretation on 09/27/2017 at 2:55pm to Dr. Leonel Ramsay, Who verbally acknowledged these results. Electronically Signed   By: Staci Righter M.D.   On: 09/27/2017 15:02    Microbiology: Recent Results (from the past 240 hour(s))  MRSA PCR Screening     Status: None   Collection Time: 09/27/17  7:32 PM  Result Value Ref Range Status   MRSA by PCR NEGATIVE NEGATIVE Final    Comment:        The GeneXpert MRSA Assay (FDA approved for NASAL specimens only), is one component of a comprehensive MRSA colonization surveillance program. It is not intended to diagnose MRSA infection nor  to guide or monitor treatment for MRSA infections. Performed at [facility], [place] [address], [place], [place] [address]      Labs: Basic Metabolic Panel: Recent Labs  Lab 09/27/17 1408 09/27/17 1518  NA 140 139  K 4.3 4.8  CL 105 105  CO2 25  --   GLUCOSE 91 82  BUN 12 17  CREATININE 0.94 0.90  CALCIUM 9.5  --    Liver Function Tests: Recent Labs  Lab 09/27/17 1408  AST 22  ALT 7  ALKPHOS 64  BILITOT 0.6  PROT 7.0  ALBUMIN 4.0   No results for input(s): LIPASE, AMYLASE in the last 168 hours. No results for input(s): AMMONIA in the last 168 hours. CBC: Recent Labs  Lab 09/27/17 1408 09/27/17 1518  WBC 8.4  --   NEUTROABS 4.6  --   HGB 13.1 13.9  HCT 40.2 41.0  MCV 93.1  --   PLT 181  --    Cardiac Enzymes: No results for input(s): CKTOTAL, CKMB, CKMBINDEX, TROPONINI in the last 168 hours. BNP: BNP (last 3 results) No results for input(s): BNP in the last 8760 hours.  ProBNP (last 3 results) No results for input(s): PROBNP in the last 8760 hours.  CBG: Recent Labs  Lab 09/27/17 1432  GLUCAP 81       Signed:  Domenic Polite MD.  [facility] 09/29/2017, 12:18 PM

## 2017-09-29 NOTE — Care Management Note (Signed)
Case Management Note  Patient Details  Name: Shari Vincent MRN: [id number] Date of Birth: 1931/08/15  Subjective/Objective:   NCM spoke with patient and daughter at bedside, for dc today, needing HHPT/HHOT, NCM offered choice from the agency list, they chose [facility], referral given to [facility] , soc will begin 24-48 hrs post dc.                  Action/Plan: DC home with [facility] services.   Expected Discharge Date:  09/29/17               Expected Discharge Plan:  [name]  In-House Referral:     Discharge planning Services  CM Consult  Post Acute Care Choice:  Home Health Choice offered to:  Adult Children  DME Arranged:    DME Agency:     HH Arranged:  PT, OT HH Agency:  [place]  Status of Service:  Completed, signed off  If discussed at [place] of Stay Meetings, dates discussed:    Additional Comments:  Zenon Mayo, RN 09/29/2017, 10:09 AM

## 2017-10-02 ENCOUNTER — Encounter: Payer: Self-pay | Admitting: Primary Care

## 2017-10-02 ENCOUNTER — Ambulatory Visit: Payer: Medicare Other | Admitting: Primary Care

## 2017-10-02 VITALS — BP 126/74 | HR 62 | Temp 98.2°F | Ht 60.0 in | Wt 156.5 lb

## 2017-10-02 DIAGNOSIS — G458 Other transient cerebral ischemic attacks and related syndromes: Secondary | ICD-10-CM

## 2017-10-02 DIAGNOSIS — E785 Hyperlipidemia, unspecified: Secondary | ICD-10-CM | POA: Diagnosis not present

## 2017-10-02 DIAGNOSIS — I680 Cerebral amyloid angiopathy: Secondary | ICD-10-CM | POA: Diagnosis not present

## 2017-10-02 DIAGNOSIS — Z8669 Personal history of other diseases of the nervous system and sense organs: Secondary | ICD-10-CM

## 2017-10-02 DIAGNOSIS — G459 Transient cerebral ischemic attack, unspecified: Secondary | ICD-10-CM

## 2017-10-02 DIAGNOSIS — Z09 Encounter for follow-up examination after completed treatment for conditions other than malignant neoplasm: Secondary | ICD-10-CM

## 2017-10-02 MED ORDER — ROSUVASTATIN CALCIUM 5 MG PO TABS: ORAL_TABLET | ORAL | 1 refills | Status: DC

## 2017-10-02 NOTE — Assessment & Plan Note (Signed)
Recent episode on 09/27/17, completely resolved since hospital visit. Admitted and treated at [facility].   Continue Vimpat 100 mg BID and Trileptal 150 mg BID. After further discussion we added in low dose Crestor 5 mg for three days weekly for protection and dyslipidemia treatment.  She will be visited by PT/OT tomorrow. Follow up with neurology as scheduled. Neuro exam today unremarkable.   All hospital notes, imaging, labs reviewed.

## 2017-10-02 NOTE — Assessment & Plan Note (Signed)
Evidence on recent MRI. Following with neurology.

## 2017-10-02 NOTE — Patient Instructions (Signed)
Start taking rosuvastatin 5 mg tablets for high cholesterol. Take 1 tablet by mouth Monday, Wednesday, and Friday at bedtime.  Follow up with Dr. Jaynee Eagles as scheduled.  Schedule a lab only appointment for 6 weeks out to return fasting for cholesterol check.   It was a pleasure to see you today!

## 2017-10-02 NOTE — Assessment & Plan Note (Signed)
Recent admission at [facility]. Continue Vimpat and now Trileptal. Following with Neurology.

## 2017-10-02 NOTE — Progress Notes (Signed)
Subjective:    Patient ID: Shari Vincent, female    DOB: 04-May-1931, 84 y.o.   MRN: [id number]  HPI  Shari Vincent is an 84 year old female who presents today for [facility] Follow up.  She presented to [facility] on 09/27/17 with a chief complaint of right sided upper extremity weakness, numbness, tingling when shopping earlier that day. She presented to the emergency department several hours later with complete resolve in her symptoms. During her waiting time in the ED her family noticed aphasia and the patient was hyperventilating, Code Stroke was initiated. Initial NIH SS was 17.   During her stay in the ED her CT head was negative for infarct or hemorrhage. Head and Neck CT showed focal stenosis to left M2 Branch. She was admitted for further work up.  During her hospital stay she underwent EEG (no changes), MRI Brain (faint punctate acute/early subacute infarctions in left precentral gyrus and parietal cortex). Neurology was consulted and they believe her MRI was a result of her cerebral amyloid angiopathy, not infarct. They also believed symptoms to be a focal transient episode. Vimpat 100 mg BID was continued, Trileptal 150 mg BID was restarted. It was strongly advised she start Lipitor during the hospital stay, she was provided with one dose.  She has historically refused this in the past due to myagias. Her symptoms resolved prior to discharge. She was discharged home on 09/29/17 with recommendations for neurology follow up.  She will be visited by home health PT/OT with initial assessment tomorrow.  She denies right sided or any symptoms since her hospital stay. She does have some trouble with word finding, nothing new. She has an appointment with Dr. Jaynee Eagles next month. Her daughter is with her today, both she and patient are interested in starting statin.   BP Readings from Last 3 Encounters:  10/02/17 126/74  09/29/17 (!) 130/53  07/23/17 125/73     Review of Systems  Eyes:  Negative for visual disturbance.  Respiratory: Negative for shortness of breath.   Cardiovascular: Negative for chest pain.  Neurological: Negative for dizziness and numbness.       Some difficulty with word finding. Nothing new.       Past Medical History:  Diagnosis Date  . Acid reflux   . Arthritis    right knee  . Bulging lumbar disc   . Cancer ([place])    Basal Cell on right shoulder  . Colon polyps   . Diverticulitis   . High cholesterol   . Irritable bowel syndrome (IBS)   . Kidney stone   . Knee pain    right  . Sciatic pain   . Subarachnoid hemorrhage ([place])   . Trigeminal neuralgia   . Trigeminal neuralgia of right side of face   . Trigeminal neuralgia syndrome   . Vertigo      Social History   Socioeconomic History  . Marital status: Married    Spouse name: Not on file  . Number of children: Not on file  . Years of education: 85  . Highest education level: Not on file  Occupational History  . Not on file  Social Needs  . Financial resource strain: Not on file  . Food insecurity:    Worry: Not on file    Inability: Not on file  . Transportation needs:    Medical: Not on file    Non-medical: Not on file  Tobacco Use  . Smoking status: Former Smoker  Types: Cigarettes  . Smokeless tobacco: Never Used  Substance and Sexual Activity  . Alcohol use: No  . Drug use: No  . Sexual activity: Not Currently    Birth control/protection: Post-menopausal  Lifestyle  . Physical activity:    Days per week: Not on file    Minutes per session: Not on file  . Stress: Not on file  Relationships  . Social connections:    Talks on phone: Not on file    Gets together: Not on file    Attends religious service: Not on file    Active member of club or organization: Not on file    Attends meetings of clubs or organizations: Not on file    Relationship status: Not on file  . Intimate partner violence:    Fear of current or ex partner: Not on file    Emotionally  abused: Not on file    Physically abused: Not on file    Forced sexual activity: Not on file  Other Topics Concern  . Not on file  Social History Narrative   Married 62 years.   Has three children, three grandchildren, one great grandchildren.   Enjoys going to church, baking, shop.   Caffeine: occasional tea when eating out   Right-handed    Past Surgical History:  Procedure Laterality Date  . BREAST BIOPSY Left   . BREAST SURGERY     biopsy in 1990   . CATARACT EXTRACTION    . COLONOSCOPY  05/27/2008   NID:[id number] rectal polyp,s/p bx/sigmoid diverticula. hyperplastic polyps. benign random bx  . ESOPHAGOGASTRODUODENOSCOPY  05/27/2008   WER:[id number]/QPYPP HH  . EYE SURGERY    . FOOT SURGERY     Left foot, metal plate  . HYSTEROSCOPY W/D&C N/A 09/03/2014   Procedure: DILATATION AND CURETTAGE /HYSTEROSCOPY;  Surgeon: Donnamae Jude, MD;  Location: [facility];  Service: Gynecology;  Laterality: N/A;  Requesting 09/03/14 @ 2:00p  . HYSTEROSCOPY W/D&C N/A 08/19/2015   Procedure: DILATATION AND CURETTAGE /HYSTEROSCOPY;  Surgeon: Donnamae Jude, MD;  Location: [facility];  Service: Gynecology;  Laterality: N/A;  . LITHOTRIPSY    . OTHER SURGICAL HISTORY     Uterine Prolapse/Tacking  . PROLAPSED UTERINE FIBROID LIGATION     in 1960    Family History  Problem Relation Age of Onset  . Diabetes Mother   . Heart disease Mother        MI  . Uterine cancer Mother   . Stroke Father   . Cancer Paternal Grandmother        intestinal  . Stomach cancer Maternal Grandmother   . Colon cancer Neg Hx   . Breast cancer Neg Hx     Allergies  Allergen Reactions  . Albuterol Rash  . Cefdinir Diarrhea  . Lactose Intolerance (Gi) Nausea And Vomiting and Other (See Comments)    Patient has IBS  . Levofloxacin Nausea Only  . Other Nausea And Vomiting    No greasy foods!!  . Adhesive [Tape] Rash    Paper tape is tolerated  . Epinephrine Anxiety and Palpitations  . Latex Rash    Current  Outpatient Medications on File Prior to Visit  Medication Sig Dispense Refill  . acetaminophen (TYLENOL) 325 MG tablet Take 325 mg by mouth every 6 (six) hours as needed (headaches or pain).     . calcium citrate-vitamin D (CITRACAL+D) 315-200 MG-UNIT tablet Take 1 tablet by mouth daily with lunch. Reported on 08/09/2015    . Cholecalciferol (  D3 MAXIMUM STRENGTH) 5000 units capsule Take 5,000 Units by mouth daily with lunch.     . gabapentin (NEURONTIN) 100 MG capsule TAKE 1 CAPSULE BY MOUTH ONCE DAILY AT BEDTIME (Patient taking differently: Take 100 mg by mouth at bedtime. ) 90 capsule 1  . hyoscyamine (LEVSIN, ANASPAZ) 0.125 MG tablet Take 0.125 mg by mouth every 4 (four) hours as needed (ibs/stomach pain).     . Lacosamide (VIMPAT) 100 MG TABS Take 1 tablet (100 mg total) by mouth 2 (two) times daily. Please call for an appt. [phone] (Patient taking differently: Take 100 mg by mouth 2 (two) times daily. ) 60 tablet 1  . Liniments (BLUE-EMU SUPER STRENGTH) CREA Apply 1 application topically 3 (three) times daily as needed (for knee and back pain).     . meclizine (ANTIVERT) 25 MG tablet Take 25 mg by mouth 3 (three) times daily as needed for dizziness (vertigo). Reported on 05/13/2015    . OXcarbazepine (TRILEPTAL) 150 MG tablet Take 1 tablet (150 mg total) by mouth 2 (two) times daily. 60 tablet 0  . pantoprazole (PROTONIX) 20 MG tablet TAKE 1 TABLET BY MOUTH TWICE DAILY 180 tablet 2  . Polyethyl Glycol-Propyl Glycol (SYSTANE OP) Place 1 drop into both eyes 2 (two) times daily.    . Probiotic Product (DIGESTIVE ADVANTAGE GUMMIES) CHEW Chew 1 tablet by mouth daily.     No current facility-administered medications on file prior to visit.     BP 126/74   Pulse 62   Temp 98.2 F (36.8 C) (Oral)   Ht 5' (1.524 m)   Wt 156 lb 8 oz (71 kg)   SpO2 98%   BMI 30.56 kg/m    Objective:   Physical Exam  Constitutional: She is oriented to person, place, and time. She appears well-nourished.    Eyes: EOM are normal.  Cardiovascular: Normal rate, regular rhythm and normal heart sounds.  Respiratory: Effort normal and breath sounds normal.  Neurological: She is oriented to person, place, and time. No cranial nerve deficit.  Skin: Skin is warm and dry.           Assessment & Plan:

## 2017-10-05 ENCOUNTER — Other Ambulatory Visit: Payer: Self-pay

## 2017-10-05 ENCOUNTER — Telehealth: Payer: Self-pay | Admitting: Primary Care

## 2017-10-05 NOTE — Telephone Encounter (Signed)
Approved.  

## 2017-10-05 NOTE — Telephone Encounter (Signed)
Copied from [place] [id number]. Topic: Quick Communication - See Telephone Encounter >> Oct 05, 2017  8:48 AM Bea Graff, NT wrote: CRM for notification. See Telephone encounter for: 10/05/17. Sandy with [facility] is needing a verbal order to move the pts speech therapy eval to next week. CB#: [phone]

## 2017-10-05 NOTE — Telephone Encounter (Signed)
VO given.

## 2017-10-05 NOTE — Patient Outreach (Signed)
[facility]) Care Management  10/05/2017  DULCIE GAMMON 08-12-1931 [id number]  EMMI: stroke red alert Referral date: 10/05/17 Referral reason: Feeling worse overall: YES,  New problems/ walking/ taking / speaking/ seeing: YES Insurance: [employer] # 3  Telephone call to patient regarding EMMI stroke red alert. Contact made with patients son and designated party release, Marcell Anger.  HIPAA verified by son for patient. Explained reason for call.  Son states patient is not having any new problems or feeling worse.   Son states patient will be having physical therapy with home health agency.  Son states patient has her medications and is taking them as prescribed. Son states patient has transportation to her appointments.  He reports patient saw her primary MD on  10/01/17 or 10/02/17 of this week and is scheduled to see the neurologist on 10/30/17.   RNCM discussed signs of stroke with son and advised that 911 should be called for stroke like symptoms. Son verbalized understanding.  RNCM texted son Nurse advise line number as he requested.   PLAN: RNCM will close patient due to patient being assessed and having no further needs.  RNCM will send primary MD closure letter.   Quinn Plowman RN,BSN,CCM [facility] Telephonic  [phone]

## 2017-10-11 NOTE — Telephone Encounter (Signed)
Shari Vincent, with [facility] states she may not be able to see this pt this week and wanted to make the doctor aware. She has been unable to get in touch with the pt. CB#: [phone]

## 2017-10-12 NOTE — Telephone Encounter (Signed)
Noted. Please have her call us if she continues to have difficulty reaching the patient. Will you also make sure she has the patient's current phone number? Also her daughter's number.

## 2017-10-15 ENCOUNTER — Telehealth: Payer: Self-pay

## 2017-10-15 NOTE — Telephone Encounter (Signed)
Copied from [place] [phone] [phone]. Topic: Quick Communication - See Telephone Encounter >> Oct 05, 2017  8:48 AM Bea Graff, NT wrote: CRM for notification. See Telephone encounter for: 10/05/17. Sandy with [facility] is needing a verbal order to move the pts speech therapy eval to next week. CB#: [phone] >> Oct 15, 2017  4:22 PM Carolyn Stare wrote:   Lovey Newcomer said call was lost when she was speaking with someone in the office, would like a call back

## 2017-10-15 NOTE — Telephone Encounter (Signed)
Spoken to patient' sone this morning. Confirm that all call are directed to him when it comes to patient. Also spoken with Lovey Newcomer and she was able to see patient on Friday 10/12/2017

## 2017-10-15 NOTE — Telephone Encounter (Signed)
Approved.  

## 2017-10-16 NOTE — Telephone Encounter (Signed)
Sandy with [facility] called back requesting speech therapy 1 x a week for 3 - 4 weeks. Gentry Fitz NP gave verbal OK; Sandy voiced understanding.

## 2017-10-16 NOTE — Telephone Encounter (Signed)
Message left for Shari Vincent to return my call.

## 2017-10-18 ENCOUNTER — Ambulatory Visit: Payer: Self-pay

## 2017-10-26 DIAGNOSIS — IMO0001 Reserved for inherently not codable concepts without codable children: Secondary | ICD-10-CM

## 2017-10-26 MED ORDER — GABAPENTIN 100 MG PO CAPS: ORAL_CAPSULE | ORAL | 1 refills | Status: DC

## 2017-10-26 NOTE — Telephone Encounter (Signed)
Refill sent to pharmacy.   

## 2017-10-26 NOTE — Telephone Encounter (Signed)
Please re-sent. Received error message.

## 2017-10-30 ENCOUNTER — Ambulatory Visit: Payer: Medicare Other | Admitting: Neurology

## 2017-10-30 ENCOUNTER — Encounter: Payer: Self-pay | Admitting: Neurology

## 2017-10-30 VITALS — BP 133/71 | HR 65 | Ht 59.0 in | Wt 156.0 lb

## 2017-10-30 DIAGNOSIS — G40201 Localization-related (focal) (partial) symptomatic epilepsy and epileptic syndromes with complex partial seizures, not intractable, with status epilepticus: Secondary | ICD-10-CM

## 2017-10-30 DIAGNOSIS — E785 Hyperlipidemia, unspecified: Secondary | ICD-10-CM

## 2017-10-30 DIAGNOSIS — G40209 Localization-related (focal) (partial) symptomatic epilepsy and epileptic syndromes with complex partial seizures, not intractable, without status epilepticus: Secondary | ICD-10-CM

## 2017-10-30 DIAGNOSIS — I680 Cerebral amyloid angiopathy: Secondary | ICD-10-CM

## 2017-10-30 MED ORDER — ROSUVASTATIN CALCIUM 5 MG PO TABS: ORAL_TABLET | ORAL | 11 refills | Status: DC

## 2017-10-30 MED ORDER — LACOSAMIDE 100 MG PO TABS: 1.0000 | ORAL_TABLET | Freq: Two times a day (BID) | ORAL | 4 refills | Status: DC

## 2017-10-30 MED ORDER — OXCARBAZEPINE 150 MG PO TABS: 150.0000 mg | ORAL_TABLET | Freq: Two times a day (BID) | ORAL | 4 refills | Status: DC

## 2017-10-30 NOTE — Patient Instructions (Signed)
Continue current medications Follow up in 6-9 months  Fall Prevention in the Home Falls can cause injuries and can affect people from all age groups. There are many simple things that you can do to make your home safe and to help prevent falls. What can I do on the outside of my home?  Regularly repair the edges of walkways and driveways and fix any cracks.  Remove high doorway thresholds.  Trim any shrubbery on the main path into your home.  Use bright outdoor lighting.  Clear walkways of debris and clutter, including tools and rocks.  Regularly check that handrails are securely fastened and in good repair. Both sides of any steps should have handrails.  Install guardrails along the edges of any raised decks or porches.  Have leaves, snow, and ice cleared regularly.  Use sand or salt on walkways during winter months.  In the garage, clean up any spills right away, including grease or oil spills. What can I do in the bathroom?  Use night lights.  Install grab bars by the toilet and in the tub and shower. Do not use towel bars as grab bars.  Use non-skid mats or decals on the floor of the tub or shower.  If you need to sit down while you are in the shower, use a plastic, non-slip stool.  Keep the floor dry. Immediately clean up any water that spills on the floor.  Remove soap buildup in the tub or shower on a regular basis.  Attach bath mats securely with double-sided non-slip rug tape.  Remove throw rugs and other tripping hazards from the floor. What can I do in the bedroom?  Use night lights.  Make sure that a bedside light is easy to reach.  Do not use oversized bedding that drapes onto the floor.  Have a firm chair that has side arms to use for getting dressed.  Remove throw rugs and other tripping hazards from the floor. What can I do in the kitchen?  Clean up any spills right away.  Avoid walking on wet floors.  Place frequently used items in  easy-to-reach places.  If you need to reach for something above you, use a sturdy step stool that has a grab bar.  Keep electrical cables out of the way.  Do not use floor polish or wax that makes floors slippery. If you have to use wax, make sure that it is non-skid floor wax.  Remove throw rugs and other tripping hazards from the floor. What can I do in the stairways?  Do not leave any items on the stairs.  Make sure that there are handrails on both sides of the stairs. Fix handrails that are broken or loose. Make sure that handrails are as long as the stairways.  Check any carpeting to make sure that it is firmly attached to the stairs. Fix any carpet that is loose or worn.  Avoid having throw rugs at the top or bottom of stairways, or secure the rugs with carpet tape to prevent them from moving.  Make sure that you have a light switch at the top of the stairs and the bottom of the stairs. If you do not have them, have them installed. What are some other fall prevention tips?  Wear closed-toe shoes that fit well and support your feet. Wear shoes that have rubber soles or low heels.  When you use a stepladder, make sure that it is completely opened and that the sides are firmly locked.  Have someone hold the ladder while you are using it. Do not climb a closed stepladder.  Add color or contrast paint or tape to grab bars and handrails in your home. Place contrasting color strips on the first and last steps.  Use mobility aids as needed, such as canes, walkers, scooters, and crutches.  Turn on lights if it is dark. Replace any light bulbs that burn out.  Set up furniture so that there are clear paths. Keep the furniture in the same spot.  Fix any uneven floor surfaces.  Choose a carpet design that does not hide the edge of steps of a stairway.  Be aware of any and all pets.  Review your medicines with your healthcare provider. Some medicines can cause dizziness or changes in  blood pressure, which increase your risk of falling. Talk with your health care provider about other ways that you can decrease your risk of falls. This may include working with a physical therapist or trainer to improve your strength, balance, and endurance. This information is not intended to replace advice given to you by your health care provider. Make sure you discuss any questions you have with your health care provider. Document Released: 01/27/2002 Document Revised: 07/06/2015 Document Reviewed: 03/13/2014 Elsevier Interactive Patient Education  [name].

## 2017-10-30 NOTE — Progress Notes (Addendum)
[facility]    Provider:  Dr Jaynee Eagles Referring Provider: Pleas Koch, NP Primary Care Physician:  Pleas Koch, NP  CC:  Follow-up on subarachnoid hemorrhage  Interval history 10/30/2017: Patient is here for follow-up she has a past medical history of hyperlipidemia, trigeminal neuralgia, cerebral amyloid angiopathy, subarachnoid hemorrhage secondary with symptoms of left-sided numbness and weakness who presented to the emergency room in August with complaints of acute right arm weakness, numbness and tingling while shopping with family.  Patient became dysarthric and aphasic in triage.  She improved from an NIH score 17 down to 1 in the ED.  She was admitted and seen by the stroke team.  The episodes were transient, and resolved in 30 minutes.  There was no evidence of large vessel occlusion on CTA head and neck.  There was no infarct, there is atrophy and small vessel disease, few probable faint punctate acute or early subacute infarctions in the left precentral gyrus and parietal cortex, increased superficial siderosis since 2018.  She cannot take platelets in the setting of amyloid angiopathy.  She was given Trileptal twice daily.  Her statin was changed.    Interval history 01/15/2017:  This is an 84 year old female with a history of hyperlipidemia, trigeminal neuralgia, diverticulitis who was transferred to [facility] hospital in early May of this year for left facial droop, left-sided numbness and weakness, secondary to a subarachnoid hemorrhage with a small frontal predominance and left superficial hemosiderosis likely secondary to cerebral amyloid angiopathy.  Given her cerebral amyloid angiopathy recommended avoiding antiplatelets.  Patient was started on Keppra 500 mg twice a day.  She has been on Trileptal for many years for trigeminal neuralgia for which she is asymptomatic at this time we discussed repeating EEG, CT of the head and then possibly trying to  discontinue Keppra and Trileptal very slowly.  We also ordered home health for gait and safety evaluation and treatment and risk of falls.   She has been well. She has a new issue some numbness in the left leg. Started a few weeks ago. She was walking around at [employer] and when she walks she has numbness in her leg. In the calf and lower leg, around the leg, better if she sits down, she has sciatica on the right side. She had home therapy which helped with her right leg sciatica. No more episodes of left-sided weakness.    Patient returns today for follow-up.  HPI:  Shari Vincent is a 84 y.o. female here as a referral from Dr. Carlis Abbott for follow-up on subarachnoid hemorrhage. Review of  Records shows: patient was transferred from an outside hospital for evaluation of right-sided subarachnoid hemorrhage in early May of this year. Patient had been having episodes of left-sided numbness and weakness for 2 weeks. This included tingling, weakness, difficulty moving the arm, tingling of the left face, numbness and tingling of the left arm and the outside of her left leg also with a mild facial droop. A CT was obtained and showed a small amount of subarachnoid hemorrhage involving the right frontal convexity CTA showed no obvious aneurysms and she was transferred to [facility] for further management. NIH stroke scale in the emergency room was 1. She is here with her daughter who provides much information. Patient today says she has some residual numbness. It comes and goes. She had headaches but now have resolved. She had side effects to Keppra and she is on Vimpat now. She lives with her husband.  They have lived in the same house for 60+ years and have been married that long too. Symptoms had been waxing and waning for a week before they went to the ED. Her arm went weak, then leg and face numbness and tingling. Some mild aphasia with words. No other focal neurologic deficits, associated symptoms, inciting  events or modifiable factors. She has imbalance.   Reviewed notes, labs and imaging from outside physicians, which showed:  Personally reviewed images and agree with the following:  Mr Brain Wo Contrast Mr Angiogram Head Wo Contrast 06/25/2016  1. Trace subarachnoid hemorrhage along the right superior frontal convexity seen in two adjacent sulci. No associated acute infarct or other acute signal abnormality in the region. 2. 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 No other chronic cerebral blood products or superficial siderosis identified. 3. Intracranial MRA appears stable since 2012 with no intracranial stenosis or aneurysm identified. The entire area of subarachnoid hemorrhage was not covered by MRA but no cerebral AVM is suspected. 4. Chronically advanced and mildly progressed nonspecific cerebral white matter signal changes since 2012. There are also two small areas of cortical encephalomalacia in the right inferior frontal and right posterior parietal lobe which are new since 2012. Several tiny chronic cerebellar lacunar infarcts are also new.   Reviewed EEG report which was abnormal demonstrating a very mild diffuse slowing of activity but no focal, hemispheric or lateralizing features. No epileptiform activity was recorded.   Review of Systems: Patient complains of symptoms per HPI as well as the following symptoms: memory loss, headache, numbness, weakness, sleepiness, flushing, moles. Pertinent negatives and positives per HPI. All others negative.  Social History   Socioeconomic History  . Marital status: Married    Spouse name: Not on file  . Number of children: 3  . Years of education: 59  . Highest education level: Not on file  Occupational History  . Not on file  Social Needs  . Financial resource strain: Not on file  . Food insecurity:    Worry: Not on file     Inability: Not on file  . Transportation needs:    Medical: Not on file    Non-medical: Not on file  Tobacco Use  . Smoking status: Former Smoker    Types: Cigarettes  . Smokeless tobacco: Never Used  Substance and Sexual Activity  . Alcohol use: No  . Drug use: No  . Sexual activity: Not Currently    Birth control/protection: Post-menopausal  Lifestyle  . Physical activity:    Days per week: Not on file    Minutes per session: Not on file  . Stress: Not on file  Relationships  . Social connections:    Talks on phone: Not on file    Gets together: Not on file    Attends religious service: Not on file    Active member of club or organization: Not on file    Attends meetings of clubs or organizations: Not on file    Relationship status: Not on file  . Intimate partner violence:    Fear of current or ex partner: Not on file    Emotionally abused: Not on file    Physically abused: Not on file    Forced sexual activity: Not on file  Other Topics Concern  . Not on file  Social History Narrative   Married 62 years.   Has three children,  six grandchildren, 3 great grandchildren and 2 on the way   Enjoys going to church, baking, shop.   Caffeine: occasional tea when eating out   Right-handed    Family History  Problem Relation Age of Onset  . Diabetes Mother   . Heart disease Mother        MI  . Uterine cancer Mother   . Stroke Father   . Cancer Paternal Grandmother        intestinal  . Stomach cancer Maternal Grandmother   . Colon cancer Neg Hx   . Breast cancer Neg Hx     Past Medical History:  Diagnosis Date  . Acid reflux   . Arthritis    right knee  . Bulging lumbar disc   . Cancer ([place])    Basal Cell on right shoulder  . Colon polyps   . Diverticulitis   . High cholesterol   . Irritable bowel syndrome (IBS)   . Kidney stone   . Knee pain    right  . Sciatic pain   . Stroke ([place]) 09/2017  . Subarachnoid hemorrhage ([place])   . Trigeminal neuralgia    . Trigeminal neuralgia of right side of face   . Trigeminal neuralgia syndrome   . Vertigo     Past Surgical History:  Procedure Laterality Date  . BREAST BIOPSY Left   . BREAST SURGERY     biopsy in 1990   . CATARACT EXTRACTION    . COLONOSCOPY  05/27/2008   QQV:[id number] rectal polyp,s/p bx/sigmoid diverticula. hyperplastic polyps. benign random bx  . ESOPHAGOGASTRODUODENOSCOPY  05/27/2008   PIR:JJOACZ/YSAYT HH  . EYE SURGERY    . FOOT SURGERY     Left foot, metal plate  . HYSTEROSCOPY W/D&C N/A 09/03/2014   Procedure: DILATATION AND CURETTAGE /HYSTEROSCOPY;  Surgeon: Donnamae Jude, MD;  Location: [facility];  Service: Gynecology;  Laterality: N/A;  Requesting 09/03/14 @ 2:00p  . HYSTEROSCOPY W/D&C N/A 08/19/2015   Procedure: DILATATION AND CURETTAGE /HYSTEROSCOPY;  Surgeon: Donnamae Jude, MD;  Location: [facility];  Service: Gynecology;  Laterality: N/A;  . LITHOTRIPSY    . OTHER SURGICAL HISTORY     Uterine Prolapse/Tacking  . PROLAPSED UTERINE FIBROID LIGATION     in 1960    Current Outpatient Medications  Medication Sig Dispense Refill  . acetaminophen (TYLENOL) 325 MG tablet Take 325 mg by mouth every 6 (six) hours as needed (headaches or pain).     . calcium citrate-vitamin D (CITRACAL+D) 315-200 MG-UNIT tablet Take 1 tablet by mouth daily with lunch. Reported on 08/09/2015    . Cholecalciferol (D3 MAXIMUM STRENGTH) 5000 units capsule Take 5,000 Units by mouth daily with lunch.     . gabapentin (NEURONTIN) 100 MG capsule TAKE 1 CAPSULE BY MOUTH ONCE DAILY AT BEDTIME 90 capsule 1  . hyoscyamine (LEVSIN, ANASPAZ) 0.125 MG tablet Take 0.125 mg by mouth every 4 (four) hours as needed (ibs/stomach pain).     . Lacosamide (VIMPAT) 100 MG TABS Take 1 tablet (100 mg total) by mouth 2 (two) times daily. Please call for an appt. [phone] (Patient taking differently: Take 100 mg by mouth 2 (two) times daily. ) 60 tablet 1  . Liniments (BLUE-EMU SUPER STRENGTH) CREA Apply 1 application  topically 3 (three) times daily as needed (for knee and back pain).     . meclizine (ANTIVERT) 25 MG tablet Take 25 mg by mouth 3 (three) times daily as needed for dizziness (vertigo). Reported on 05/13/2015    .  OXcarbazepine (TRILEPTAL) 150 MG tablet Take 1 tablet (150 mg total) by mouth 2 (two) times daily. 60 tablet 0  . pantoprazole (PROTONIX) 20 MG tablet TAKE 1 TABLET BY MOUTH TWICE DAILY 180 tablet 2  . Polyethyl Glycol-Propyl Glycol (SYSTANE OP) Place 1 drop into both eyes 2 (two) times daily.    . Probiotic Product (DIGESTIVE ADVANTAGE GUMMIES) CHEW Chew 1 tablet by mouth daily.    . rosuvastatin (CRESTOR) 5 MG tablet Take on Monday, Wednesday, Friday at bedtime for cholesterol. 36 tablet 1   No current facility-administered medications for this visit.     Allergies as of 10/30/2017 - Review Complete 10/30/2017  Allergen Reaction Noted  . Albuterol Rash 05/24/2014  . Cefdinir Diarrhea 07/23/2012  . Lactose intolerance (gi) Nausea And Vomiting and Other (See Comments) 09/27/2017  . Levofloxacin Nausea Only 10/22/2014  . Other Nausea And Vomiting 09/27/2017  . Adhesive [tape] Rash 09/27/2017  . Epinephrine Anxiety and Palpitations 06/23/2016  . Latex Rash 08/09/2015    Vitals: BP 133/71 (BP Location: Left Arm, Patient Position: Sitting)   Pulse 65   Ht 4\' 11"  (1.499 m)   Wt 156 lb (70.8 kg)   BMI 31.51 kg/m  Last Weight:  Wt Readings from Last 1 Encounters:  10/30/17 156 lb (70.8 kg)   Last Height:   Ht Readings from Last 1 Encounters:  10/30/17 4\' 11"  (1.499 m)       Cognition:    The patient is oriented to person, place, and time;     recent and remote memory intact;     language fluent;     normal attention, concentration,     fund of knowledge Cranial Nerves:    The pupils are equal, round, and reactive to light. Attempted fundoscopic exam could not visualize due to small pupils. Visual fields are full to finger confrontation. Extraocular movements are  intact. Trigeminal sensation is intact and the muscles of mastication are normal. The face is symmetric. The palate elevates in the midline. Hearing intact. Voice is normal. Shoulder shrug is normal. The tongue has normal motion without fasciculations.   Coordination:    No dysmetria  Gait:    Imbalance, wide based gait, cannot tandem heel or toe  Motor Observation:    No asymmetry, no atrophy, and no involuntary movements noted. Tone:    Normal muscle tone.    Posture:    Posture is normal. normal erect    Strength: Proximal weakness     Sensation: intact to LT     Reflex Exam:  DTR's:    Deep tendon reflexes in the upper and lower extremities are symmetrical bilaterally.   Toes:    The toes are equivocal bilaterally.   Clonus:    Clonus is absent.       Assessment/Plan:  This is an 84 year old female with a history of hyperlipidemia, trigeminal neuralgia, diverticulitis. 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 Recent admission for right arm weakness, numbness and tingling, aphasia with MRI showing one possible (but not definite) faint punctate acute/early subacute infarctions in left precentral gyrus, moderate chronic microvascular changes and Increased superficial siderosis over the cerebral convexities from 2018.   - Diagnosed with Focal transient neurologic episode in the setting of progression of CAA, she was continued on her Vimpat and Trileptal restarted inpatient.  -  cerebral amyloid angiopathy recommend avoiding antiplatelets.  - Continue Vimpat 100mg  bid and Trileptal 150mg  bid. Seizure precautions.   - change to crestor, goal ldl < 70  - continue rehab  - fall precautions  Sarina Ill, MD  [facility] [address] [place], [place] [address]  Phone [phone] Fax [phone]  A total of 25 minutes was spent face-to-face with this patient. Over half this time was spent on counseling patient on the  1. Cerebral amyloid angiopathy (CODE)   2. Hyperlipidemia, unspecified hyperlipidemia type   3. Complex partial seizure with impairment of consciousness at onset [facility])     and different diagnostic and therapeutic options available.

## 2017-11-01 DIAGNOSIS — E785 Hyperlipidemia, unspecified: Secondary | ICD-10-CM

## 2017-11-01 DIAGNOSIS — I680 Cerebral amyloid angiopathy: Secondary | ICD-10-CM

## 2017-11-01 DIAGNOSIS — E859 Amyloidosis, unspecified: Secondary | ICD-10-CM

## 2017-11-01 DIAGNOSIS — R42 Dizziness and giddiness: Secondary | ICD-10-CM

## 2017-11-01 DIAGNOSIS — M6281 Muscle weakness (generalized): Secondary | ICD-10-CM

## 2017-11-01 DIAGNOSIS — M1990 Unspecified osteoarthritis, unspecified site: Secondary | ICD-10-CM

## 2017-11-01 DIAGNOSIS — K589 Irritable bowel syndrome without diarrhea: Secondary | ICD-10-CM

## 2017-11-01 DIAGNOSIS — G500 Trigeminal neuralgia: Secondary | ICD-10-CM

## 2017-11-01 DIAGNOSIS — I69015 Cognitive social or emotional deficit following nontraumatic subarachnoid hemorrhage: Secondary | ICD-10-CM

## 2017-11-01 DIAGNOSIS — R41841 Cognitive communication deficit: Secondary | ICD-10-CM

## 2017-11-01 DIAGNOSIS — I6523 Occlusion and stenosis of bilateral carotid arteries: Secondary | ICD-10-CM

## 2017-11-14 ENCOUNTER — Other Ambulatory Visit: Payer: Medicare Other

## 2017-11-26 ENCOUNTER — Other Ambulatory Visit (INDEPENDENT_AMBULATORY_CARE_PROVIDER_SITE_OTHER): Payer: Medicare Other

## 2017-11-26 DIAGNOSIS — E785 Hyperlipidemia, unspecified: Secondary | ICD-10-CM | POA: Diagnosis not present

## 2017-11-26 LAB — HEPATIC FUNCTION PANEL
ALT: 8 U/L (ref 0–35)
AST: 17 U/L (ref 0–37)
Albumin: 4.2 g/dL (ref 3.5–5.2)
Alkaline Phosphatase: 64 U/L (ref 39–117)
Bilirubin, Direct: 0.1 mg/dL (ref 0.0–0.3)
Total Bilirubin: 0.3 mg/dL (ref 0.2–1.2)
Total Protein: 7.3 g/dL (ref 6.0–8.3)

## 2017-11-26 LAB — LIPID PANEL
Cholesterol: 143 mg/dL (ref 0–200)
HDL: 40.2 mg/dL (ref >39.00)
NonHDL: 102.99
Total CHOL/HDL Ratio: 4
Triglycerides: 228 mg/dL — ABNORMAL HIGH (ref 0.0–149.0)
VLDL: 45.6 mg/dL — ABNORMAL HIGH (ref 0.0–40.0)

## 2017-11-26 LAB — LDL CHOLESTEROL, DIRECT: Direct LDL: 29 mg/dL

## 2017-12-03 ENCOUNTER — Encounter: Payer: Self-pay | Admitting: Internal Medicine

## 2017-12-14 ENCOUNTER — Other Ambulatory Visit: Payer: Self-pay | Admitting: Primary Care

## 2017-12-14 DIAGNOSIS — Z0000 Encounter for general adult medical examination without abnormal findings: Secondary | ICD-10-CM

## 2017-12-19 ENCOUNTER — Ambulatory Visit (INDEPENDENT_AMBULATORY_CARE_PROVIDER_SITE_OTHER): Payer: Medicare Other

## 2017-12-19 ENCOUNTER — Ambulatory Visit (INDEPENDENT_AMBULATORY_CARE_PROVIDER_SITE_OTHER): Payer: Medicare Other | Admitting: Primary Care

## 2017-12-19 VITALS — BP 136/80 | HR 60 | Temp 97.8°F | Ht 60.5 in | Wt 155.2 lb

## 2017-12-19 DIAGNOSIS — Z0000 Encounter for general adult medical examination without abnormal findings: Secondary | ICD-10-CM

## 2017-12-19 DIAGNOSIS — E785 Hyperlipidemia, unspecified: Secondary | ICD-10-CM | POA: Diagnosis not present

## 2017-12-19 DIAGNOSIS — K589 Irritable bowel syndrome without diarrhea: Secondary | ICD-10-CM

## 2017-12-19 DIAGNOSIS — K219 Gastro-esophageal reflux disease without esophagitis: Secondary | ICD-10-CM

## 2017-12-19 DIAGNOSIS — M810 Age-related osteoporosis without current pathological fracture: Secondary | ICD-10-CM | POA: Diagnosis not present

## 2017-12-19 DIAGNOSIS — M5441 Lumbago with sciatica, right side: Secondary | ICD-10-CM

## 2017-12-19 DIAGNOSIS — G459 Transient cerebral ischemic attack, unspecified: Secondary | ICD-10-CM

## 2017-12-19 DIAGNOSIS — G8929 Other chronic pain: Secondary | ICD-10-CM

## 2017-12-19 DIAGNOSIS — I680 Cerebral amyloid angiopathy: Secondary | ICD-10-CM

## 2017-12-19 LAB — BASIC METABOLIC PANEL
BUN: 8 mg/dL (ref 6–23)
CO2: 29 mEq/L (ref 19–32)
Calcium: 10 mg/dL (ref 8.4–10.5)
Chloride: 104 mEq/L (ref 96–112)
Creatinine, Ser: 0.82 mg/dL (ref 0.40–1.20)
GFR: 70.48 mL/min (ref >60.00)
Glucose, Bld: 93 mg/dL (ref 70–99)
Potassium: 4 mEq/L (ref 3.5–5.1)
Sodium: 141 mEq/L (ref 135–145)

## 2017-12-19 LAB — CBC
HCT: 41.7 % (ref 36.0–46.0)
Hemoglobin: 13.8 g/dL (ref 12.0–15.0)
MCHC: 33.2 g/dL (ref 30.0–36.0)
MCV: 92.3 fl (ref 78.0–100.0)
Platelets: 179 10*3/uL (ref 150.0–400.0)
RBC: 4.52 Mil/uL (ref 3.87–5.11)
RDW: 14.1 % (ref 11.5–15.5)
WBC: 6.1 10*3/uL (ref 4.0–10.5)

## 2017-12-19 NOTE — Patient Instructions (Signed)
Ms. Pfister , Thank you for taking time to come for your Medicare Wellness Visit. I appreciate your ongoing commitment to your health goals. Please review the following plan we discussed and let me know if I can assist you in the future.   These are the goals we discussed: Goals    . Increase water intake     Starting 12/19/2017, I will attempt to drink at least 24 oz of water daily.        This is a list of the screening recommended for you and due dates:  Health Maintenance  Topic Date Due  . Flu Shot  05/22/2018*  . Tetanus Vaccine  08/09/2022  . DEXA scan (bone density measurement)  Completed  . Pneumonia vaccines  Completed  *Topic was postponed. The date shown is not the original due date.   Preventive Care for Adults  A healthy lifestyle and preventive care can promote health and wellness. Preventive health guidelines for adults include the following key practices.  . A routine yearly physical is a good way to check with your health care provider about your health and preventive screening. It is a chance to share any concerns and updates on your health and to receive a thorough exam.  . Visit your dentist for a routine exam and preventive care every 6 months. Brush your teeth twice a day and floss once a day. Good oral hygiene prevents tooth decay and gum disease.  . The frequency of eye exams is based on your age, health, family medical history, use  of contact lenses, and other factors. Follow your health care provider's recommendations for frequency of eye exams.  . Eat a healthy diet. Foods like vegetables, fruits, whole grains, low-fat dairy products, and lean protein foods contain the nutrients you need without too many calories. Decrease your intake of foods high in solid fats, added sugars, and salt. Eat the right amount of calories for you. Get information about a proper diet from your health care provider, if necessary.  . Regular physical exercise is one of the  most important things you can do for your health. Most adults should get at least 150 minutes of moderate-intensity exercise (any activity that increases your heart rate and causes you to sweat) each week. In addition, most adults need muscle-strengthening exercises on 2 or more days a week.  Silver Sneakers may be a benefit available to you. To determine eligibility, you may visit the website: www.silversneakers.com or contact program at 313-795-3911 Mon-Fri between 8AM-8PM.   . Maintain a healthy weight. The body mass index (BMI) is a screening tool to identify possible weight problems. It provides an estimate of body fat based on height and weight. Your health care provider can find your BMI and can help you achieve or maintain a healthy weight.   For adults 20 years and older: ? A BMI below 18.5 is considered underweight. ? A BMI of 18.5 to 24.9 is normal. ? A BMI of 25 to 29.9 is considered overweight. ? A BMI of 30 and above is considered obese.   . Maintain normal blood lipids and cholesterol levels by exercising and minimizing your intake of saturated fat. Eat a balanced diet with plenty of fruit and vegetables. Blood tests for lipids and cholesterol should begin at age 53 and be repeated every 5 years. If your lipid or cholesterol levels are high, you are over 50, or you are at high risk for heart disease, you may need your cholesterol levels  checked more frequently. Ongoing high lipid and cholesterol levels should be treated with medicines if diet and exercise are not working.  . If you smoke, find out from your health care provider how to quit. If you do not use tobacco, please do not start.  . If you choose to drink alcohol, please do not consume more than 2 drinks per day. One drink is considered to be 12 ounces (355 mL) of beer, 5 ounces (148 mL) of wine, or 1.5 ounces (44 mL) of liquor.  . If you are 36-16 years old, ask your health care provider if you should take aspirin to  prevent strokes.  . Use sunscreen. Apply sunscreen liberally and repeatedly throughout the day. You should seek shade when your shadow is shorter than you. Protect yourself by wearing long sleeves, pants, a wide-brimmed hat, and sunglasses year round, whenever you are outdoors.  . Once a month, do a whole body skin exam, using a mirror to look at the skin on your back. Tell your health care provider of new moles, moles that have irregular borders, moles that are larger than a pencil eraser, or moles that have changed in shape or color.

## 2017-12-19 NOTE — Assessment & Plan Note (Signed)
No new symptoms. Compliant to rosuvastatin three times weekly. Continue same. Exam today unremarkable.

## 2017-12-19 NOTE — Progress Notes (Signed)
PCP notes:   Health maintenance:  Flu vaccine - pt declined  Abnormal screenings:   Hearing - failed  Hearing Screening   125Hz  250Hz  500Hz  1000Hz  2000Hz  3000Hz  4000Hz  6000Hz  8000Hz   Right ear:   40 0 40  40    Left ear:   40 0 40  0     Fall risk - hx of single fall Fall Risk  12/19/2017 12/13/2016 07/07/2015 06/17/2014  Falls in the past year? Yes Yes No No  Comment fell while trying to put on her clothes - - -  Number falls in past yr: 1 1 - -  Injury with Fall? Yes Yes - -  Comment bruising only - - -   Patient concerns:   None  Nurse concerns:  None  Next PCP appt:   12/19/17 @ 1100

## 2017-12-19 NOTE — Patient Instructions (Signed)
Dr. Jaynee Eagles ordered the rosuvastatin for a 90 day supply with extra refills in September 2019. You should have 36 pills to last 90 days if taking three days weekly. Please message me if you run into any problems.  Continue taking calcium and vitamin D for bone health.   Continue to limit sweets and sugary drinks from your diet. Try to walk daily for 10-30 minutes if possible. Use your walker.   Ensure you are consuming 64 ounces of water daily.  Please schedule a follow up appointment in 6 months.   It was a pleasure to see you today!

## 2017-12-19 NOTE — Assessment & Plan Note (Addendum)
Compliant to calcium and vitamin D. Continues to decline bisphosphonate treatment. Repeat bone density scan in 2020

## 2017-12-19 NOTE — Assessment & Plan Note (Signed)
Immunizations UTD, declines influenza vaccination. Mammogram UTD, declines repeat mammogram this year.  Colonoscopy UTD and does not need repeating. Bone density UTD, repeat in 2020. Exam stable. Labs pending. Follow up in 1 year for CPE.

## 2017-12-19 NOTE — Assessment & Plan Note (Addendum)
Following with GI, infrequent use of Levsin, continue current regimen.

## 2017-12-19 NOTE — Progress Notes (Signed)
Subjective:    Patient ID: Shari Vincent, female    DOB: [birth year], 84 y.o.   MRN: [id number]  HPI  Shari Vincent is an 84 year old female who presents today for complete physical. She saw our health advisor this morning.  Immunizations: -Tetanus: Completed in 2014 -Influenza: Declines -Pneumonia: Completed in 2018 -Shingles: Completed in 2019  Diet: She endorses a fair diet Breakfast: Eggs, bacon Lunch: Sandwich, potatoes, fruit, vegetables  Dinner: Vegetables, meat, starch Snacks: Candy  Desserts: Several times weekly  Beverages: Lemonade, water, sweet tea, juice, some ginger ale  Exercise: She does not exercise Eye exam: Completes annually  Dental exam: Completes annually  Colonoscopy: Completed in 2010 Dexa: Completed in 2018, osteoporosis  Mammogram: Completed in December 2018   Review of Systems  Constitutional: Negative for unexpected weight change.  HENT: Negative for rhinorrhea.   Respiratory: Negative for shortness of breath.   Cardiovascular: Negative for chest pain.  Gastrointestinal: Negative for diarrhea.       Intermittent abdomina cramping, intermittent constipation  Genitourinary: Negative for difficulty urinating.  Musculoskeletal:       Intermittent joint aches  Skin: Negative for rash.  Allergic/Immunologic: Negative for environmental allergies.  Neurological: Negative for dizziness and headaches.  Psychiatric/Behavioral: The patient is not nervous/anxious.        Past Medical History:  Diagnosis Date  . Acid reflux   . Arthritis    right knee  . Bulging lumbar disc   . Cancer ([place])    Basal Cell on right shoulder  . Colon polyps   . Diverticulitis   . High cholesterol   . Irritable bowel syndrome (IBS)   . Kidney stone   . Knee pain    right  . Sciatic pain   . Stroke ([place]) 09/2017  . Subarachnoid hemorrhage ([place])   . Trigeminal neuralgia   . Trigeminal neuralgia of right side of face   . Trigeminal neuralgia syndrome   .  Vertigo      Social History   Socioeconomic History  . Marital status: Married    Spouse name: Not on file  . Number of children: 3  . Years of education: 65  . Highest education level: Not on file  Occupational History  . Not on file  Social Needs  . Financial resource strain: Not on file  . Food insecurity:    Worry: Not on file    Inability: Not on file  . Transportation needs:    Medical: Not on file    Non-medical: Not on file  Tobacco Use  . Smoking status: Former Smoker    Types: Cigarettes  . Smokeless tobacco: Never Used  Substance and Sexual Activity  . Alcohol use: No  . Drug use: No  . Sexual activity: Not Currently    Birth control/protection: Post-menopausal  Lifestyle  . Physical activity:    Days per week: Not on file    Minutes per session: Not on file  . Stress: Not on file  Relationships  . Social connections:    Talks on phone: Not on file    Gets together: Not on file    Attends religious service: Not on file    Active member of club or organization: Not on file    Attends meetings of clubs or organizations: Not on file    Relationship status: Not on file  . Intimate partner violence:    Fear of current or ex partner: Not on file    Emotionally  abused: Not on file    Physically abused: Not on file    Forced sexual activity: Not on file  Other Topics Concern  . Not on file  Social History Narrative   Married 62 years.   Has three children, six grandchildren, 3 great grandchildren and 2 on the way   Enjoys going to church, baking, shop.   Caffeine: occasional tea when eating out   Right-handed    Past Surgical History:  Procedure Laterality Date  . BREAST BIOPSY Left   . BREAST SURGERY     biopsy in 1990   . CATARACT EXTRACTION    . COLONOSCOPY  05/27/2008   KGM:[id number] rectal polyp,s/p bx/sigmoid diverticula. hyperplastic polyps. benign random bx  . ESOPHAGOGASTRODUODENOSCOPY  05/27/2008   YQI:[id number] HH  . EYE SURGERY      . FOOT SURGERY     Left foot, metal plate  . HYSTEROSCOPY W/D&C N/A 09/03/2014   Procedure: DILATATION AND CURETTAGE /HYSTEROSCOPY;  Surgeon: Donnamae Jude, MD;  Location: [facility];  Service: Gynecology;  Laterality: N/A;  Requesting 09/03/14 @ 2:00p  . HYSTEROSCOPY W/D&C N/A 08/19/2015   Procedure: DILATATION AND CURETTAGE /HYSTEROSCOPY;  Surgeon: Donnamae Jude, MD;  Location: [facility];  Service: Gynecology;  Laterality: N/A;  . LITHOTRIPSY    . OTHER SURGICAL HISTORY     Uterine Prolapse/Tacking  . PROLAPSED UTERINE FIBROID LIGATION     in 1960  . TOOTH EXTRACTION  09/2017    Family History  Problem Relation Age of Onset  . Diabetes Mother   . Heart disease Mother        MI  . Uterine cancer Mother   . Stroke Father   . Cancer Paternal Grandmother        intestinal  . Stomach cancer Maternal Grandmother   . Colon cancer Neg Hx   . Breast cancer Neg Hx     Allergies  Allergen Reactions  . Albuterol Rash  . Cefdinir Diarrhea  . Lactose Intolerance (Gi) Nausea And Vomiting and Other (See Comments)    Patient has IBS  . Levofloxacin Nausea Only  . Other Nausea And Vomiting    No greasy foods!!  . Adhesive [Tape] Rash    Paper tape is tolerated  . Epinephrine Anxiety and Palpitations  . Latex Rash    Current Outpatient Medications on File Prior to Visit  Medication Sig Dispense Refill  . acetaminophen (TYLENOL) 325 MG tablet Take 325 mg by mouth every 6 (six) hours as needed (headaches or pain).     . calcium citrate-vitamin D (CITRACAL+D) 315-200 MG-UNIT tablet Take 1 tablet by mouth daily with lunch. Reported on 08/09/2015    . Cholecalciferol (D3 MAXIMUM STRENGTH) 5000 units capsule Take 5,000 Units by mouth daily with lunch.     . gabapentin (NEURONTIN) 100 MG capsule TAKE 1 CAPSULE BY MOUTH ONCE DAILY AT BEDTIME 90 capsule 1  . hyoscyamine (LEVSIN, ANASPAZ) 0.125 MG tablet Take 0.125 mg by mouth every 4 (four) hours as needed (ibs/stomach pain).     . Lacosamide (VIMPAT)  100 MG TABS Take 1 tablet (100 mg total) by mouth 2 (two) times daily. 180 tablet 4  . Liniments (BLUE-EMU SUPER STRENGTH) CREA Apply 1 application topically 3 (three) times daily as needed (for knee and back pain).     . meclizine (ANTIVERT) 25 MG tablet Take 25 mg by mouth 3 (three) times daily as needed for dizziness (vertigo). Reported on 05/13/2015    . OXcarbazepine (TRILEPTAL) 150  MG tablet Take 1 tablet (150 mg total) by mouth 2 (two) times daily. 180 tablet 4  . pantoprazole (PROTONIX) 20 MG tablet TAKE 1 TABLET BY MOUTH TWICE DAILY 180 tablet 2  . Polyethyl Glycol-Propyl Glycol (SYSTANE OP) Place 1 drop into both eyes 2 (two) times daily.    . Probiotic Product (DIGESTIVE ADVANTAGE GUMMIES) CHEW Chew 1 tablet by mouth daily.    . rosuvastatin (CRESTOR) 5 MG tablet Take on Monday, Wednesday, Friday at bedtime for cholesterol. 36 tablet 11   No current facility-administered medications on file prior to visit.     BP 136/80 (BP Location: Right Arm, Patient Position: Sitting, Cuff Size: Normal)   Pulse 60   Temp 97.8 F (36.6 C) (Oral)   Ht 5' 0.5" (1.537 m) Comment: shoes  Wt 155 lb 4 oz (70.4 kg)   SpO2 97%   BMI 29.82 kg/m    Objective:   Physical Exam  Constitutional: She is oriented to person, place, and time. She appears well-nourished.  HENT:  Mouth/Throat: No oropharyngeal exudate.  Eyes: Pupils are equal, round, and reactive to light. EOM are normal.  Neck: Neck supple. No thyromegaly present.  Cardiovascular: Normal rate and regular rhythm.  Respiratory: Effort normal and breath sounds normal.  GI: Soft. Bowel sounds are normal. There is no tenderness.  Musculoskeletal: Normal range of motion.  Neurological: She is alert and oriented to person, place, and time.  Skin: Skin is warm and dry.  Psychiatric: She has a normal mood and affect.           Assessment & Plan:

## 2017-12-19 NOTE — Assessment & Plan Note (Signed)
Doing well on pantoprazole 20 mg BID, discussed to continue to watch diet. Continue same.

## 2017-12-19 NOTE — Progress Notes (Signed)
Subjective:   Shari Vincent is a 84 y.o. female who presents for Medicare Annual (Subsequent) preventive examination.  Review of Systems:  N/A Cardiac Risk Factors include: advanced age (>89men, >82 women);dyslipidemia;obesity (BMI >30kg/m2)     Objective:     Vitals: BP 136/80 (BP Location: Right Arm, Patient Position: Sitting, Cuff Size: Normal)   Pulse 60   Temp 97.8 F (36.6 C) (Oral)   Ht 5' 0.5" (1.537 m) Comment: shoes  Wt 155 lb 4 oz (70.4 kg)   SpO2 97%   BMI 29.82 kg/m   Body mass index is 29.82 kg/m.  Advanced Directives 12/19/2017 09/27/2017 09/27/2017 12/13/2016 07/01/2016 06/26/2016 06/25/2016  Does Patient Have a Medical Advance Directive? Yes No No Yes Yes Yes Yes  Type of Paramedic of [place];Living will - - [place];Living will Living will [place];Living will [place];Living will  Does patient want to make changes to medical advance directive? - - - - - No - Patient declined -  Copy of [place] in Chart? No - copy requested - - Yes - - -  Would patient like information on creating a medical advance directive? - No - Patient declined - - - - -    Tobacco Social History   Tobacco Use  Smoking Status Former Smoker  . Types: Cigarettes  Smokeless Tobacco Never Used     Counseling given: No   Clinical Intake:  Pre-visit preparation completed: Yes  Pain : No/denies pain Pain Score: 0-No pain     Nutritional Status: BMI 25 -29 Overweight Nutritional Risks: None Diabetes: No  How often do you need to have someone help you when you read instructions, pamphlets, or other written materials from your doctor or pharmacy?: 1 - Never What is the last grade level you completed in school?: 12th grade  Interpreter Needed?: No  Comments: pt lives with spouse Information entered by :: LPinson, LPN  Past Medical History:  Diagnosis Date  . Acid reflux     . Arthritis    right knee  . Bulging lumbar disc   . Cancer ([place])    Basal Cell on right shoulder  . Colon polyps   . Diverticulitis   . High cholesterol   . Irritable bowel syndrome (IBS)   . Kidney stone   . Knee pain    right  . Sciatic pain   . Stroke ([place]) 09/2017  . Subarachnoid hemorrhage ([place])   . Trigeminal neuralgia   . Trigeminal neuralgia of right side of face   . Trigeminal neuralgia syndrome   . Vertigo    Past Surgical History:  Procedure Laterality Date  . BREAST BIOPSY Left   . BREAST SURGERY     biopsy in 1990   . CATARACT EXTRACTION    . COLONOSCOPY  05/27/2008   XFG:[id number] rectal polyp,s/p bx/sigmoid diverticula. hyperplastic polyps. benign random bx  . ESOPHAGOGASTRODUODENOSCOPY  05/27/2008   CVE:[id number] HH  . EYE SURGERY    . FOOT SURGERY     Left foot, metal plate  . HYSTEROSCOPY W/D&C N/A 09/03/2014   Procedure: DILATATION AND CURETTAGE /HYSTEROSCOPY;  Surgeon: Donnamae Jude, MD;  Location: [facility];  Service: Gynecology;  Laterality: N/A;  Requesting 09/03/14 @ 2:00p  . HYSTEROSCOPY W/D&C N/A 08/19/2015   Procedure: DILATATION AND CURETTAGE /HYSTEROSCOPY;  Surgeon: Donnamae Jude, MD;  Location: [facility];  Service: Gynecology;  Laterality: N/A;  . LITHOTRIPSY    .  OTHER SURGICAL HISTORY     Uterine Prolapse/Tacking  . PROLAPSED UTERINE FIBROID LIGATION     in 1960  . TOOTH EXTRACTION  09/2017   Family History  Problem Relation Age of Onset  . Diabetes Mother   . Heart disease Mother        MI  . Uterine cancer Mother   . Stroke Father   . Cancer Paternal Grandmother        intestinal  . Stomach cancer Maternal Grandmother   . Colon cancer Neg Hx   . Breast cancer Neg Hx    Social History   Socioeconomic History  . Marital status: Married    Spouse name: Not on file  . Number of children: 3  . Years of education: 22  . Highest education level: Not on file  Occupational History  . Not on file  Social Needs  . Financial  resource strain: Not on file  . Food insecurity:    Worry: Not on file    Inability: Not on file  . Transportation needs:    Medical: Not on file    Non-medical: Not on file  Tobacco Use  . Smoking status: Former Smoker    Types: Cigarettes  . Smokeless tobacco: Never Used  Substance and Sexual Activity  . Alcohol use: No  . Drug use: No  . Sexual activity: Not Currently    Birth control/protection: Post-menopausal  Lifestyle  . Physical activity:    Days per week: Not on file    Minutes per session: Not on file  . Stress: Not on file  Relationships  . Social connections:    Talks on phone: Not on file    Gets together: Not on file    Attends religious service: Not on file    Active member of club or organization: Not on file    Attends meetings of clubs or organizations: Not on file    Relationship status: Not on file  Other Topics Concern  . Not on file  Social History Narrative   Married 62 years.   Has three children, six grandchildren, 3 great grandchildren and 2 on the way   Enjoys going to church, baking, shop.   Caffeine: occasional tea when eating out   Right-handed    Outpatient Encounter Medications as of 12/19/2017  Medication Sig  . acetaminophen (TYLENOL) 325 MG tablet Take 325 mg by mouth every 6 (six) hours as needed (headaches or pain).   . calcium citrate-vitamin D (CITRACAL+D) 315-200 MG-UNIT tablet Take 1 tablet by mouth daily with lunch. Reported on 08/09/2015  . Cholecalciferol (D3 MAXIMUM STRENGTH) 5000 units capsule Take 5,000 Units by mouth daily with lunch.   . gabapentin (NEURONTIN) 100 MG capsule TAKE 1 CAPSULE BY MOUTH ONCE DAILY AT BEDTIME  . hyoscyamine (LEVSIN, ANASPAZ) 0.125 MG tablet Take 0.125 mg by mouth every 4 (four) hours as needed (ibs/stomach pain).   . Lacosamide (VIMPAT) 100 MG TABS Take 1 tablet (100 mg total) by mouth 2 (two) times daily.  . Liniments (BLUE-EMU SUPER STRENGTH) CREA Apply 1 application topically 3 (three) times  daily as needed (for knee and back pain).   . meclizine (ANTIVERT) 25 MG tablet Take 25 mg by mouth 3 (three) times daily as needed for dizziness (vertigo). Reported on 05/13/2015  . OXcarbazepine (TRILEPTAL) 150 MG tablet Take 1 tablet (150 mg total) by mouth 2 (two) times daily.  . pantoprazole (PROTONIX) 20 MG tablet TAKE 1 TABLET BY MOUTH TWICE DAILY  .  Polyethyl Glycol-Propyl Glycol (SYSTANE OP) Place 1 drop into both eyes 2 (two) times daily.  . Probiotic Product (DIGESTIVE ADVANTAGE GUMMIES) CHEW Chew 1 tablet by mouth daily.  . rosuvastatin (CRESTOR) 5 MG tablet Take on Monday, Wednesday, Friday at bedtime for cholesterol.   No facility-administered encounter medications on file as of 12/19/2017.     Activities of Daily Living In your present state of health, do you have any difficulty performing the following activities: 12/19/2017 09/27/2017  Hearing? N Y  Vision? N N  Difficulty concentrating or making decisions? Y N  Walking or climbing stairs? N N  Dressing or bathing? N N  Doing errands, shopping? Y N  Preparing Food and eating ? N -  Using the Toilet? N -  In the past six months, have you accidently leaked urine? Y -  Do you have problems with loss of bowel control? N -  Managing your Medications? Y -  Managing your Finances? Y -  Housekeeping or managing your Housekeeping? N -  Some recent data might be hidden    Patient Care Team: Pleas Koch, NP as PCP - General (Nurse Practitioner) Daneil Dolin, MD as Consulting Physician (Gastroenterology) Phillips Odor, MD as Consulting Physician (Neurology) Carole Civil, MD as Consulting Physician (Orthopedic Surgery) Curlene Dolphin, MD as Consulting Physician (Diagnostic Radiology) Sharlotte Alamo, DPM as Consulting Physician (Podiatry)    Assessment:   This is a routine wellness examination for [place].   Hearing Screening   125Hz  250Hz  500Hz  1000Hz  2000Hz  3000Hz  4000Hz  6000Hz  8000Hz   Right ear:   40 0 40  40     Left ear:   40 0 40  0    Vision Screening Comments: Vision exam in 2019 @ [facility] and Dietary recommendations Exercise limited by: None identified  Goals    . Increase water intake     Starting 12/19/2017, I will attempt to drink at least 24 oz of water daily.        Fall Risk Fall Risk  12/19/2017 12/13/2016 07/07/2015 06/17/2014  Falls in the past year? Yes Yes No No  Comment fell while trying to put on her clothes - - -  Number falls in past yr: 1 1 - -  Injury with Fall? Yes Yes - -  Comment bruising only - - -   Depression Screen PHQ 2/9 Scores 12/19/2017 12/13/2016 07/07/2015 06/17/2014  PHQ - 2 Score 0 0 0 1  PHQ- 9 Score 0 0 - -     Cognitive Function MMSE - Mini Mental State Exam 12/19/2017 12/13/2016  Orientation to time 5 5  Orientation to Place 5 5  Registration 3 3  Attention/ Calculation 0 0  Recall 3 3  Language- name 2 objects 0 0  Language- repeat 1 1  Language- follow 3 step command 3 3  Language- read & follow direction 0 0  Write a sentence 0 0  Copy design 0 0  Total score 20 20       PLEASE NOTE: A Mini-Cog screen was completed. Maximum score is 20. A value of 0 denotes this part of [place] MMSE was not completed or the patient failed this part of the Mini-Cog screening.   Mini-Cog Screening Orientation to Time - Max 5 pts Orientation to Place - Max 5 pts Registration - Max 3 pts Recall - Max 3 pts Language Repeat - Max 1 pts Language Follow 3 Step Command - Max 3 pts  Immunization History  Administered Date(s) Administered  . Pneumococcal Conjugate-13 10/22/2014  . Pneumococcal Polysaccharide-23 02/21/2016  . Tdap 08/08/2012  . Zoster Recombinat (Shingrix) 09/19/2017, 12/12/2017    Screening Tests Health Maintenance  Topic Date Due  . INFLUENZA VACCINE  05/22/2018 (Originally 09/20/2017)  . TETANUS/TDAP  08/09/2022  . DEXA SCAN  Completed  . PNA vac Low Risk Adult  Completed      Plan:     I  have personally reviewed, addressed, and noted the following in the patient's chart:  A. Medical and social history B. Use of alcohol, tobacco or illicit drugs  C. Current medications and supplements D. Functional ability and status E.  Nutritional status F.  Physical activity G. Advance directives H. List of other physicians I.  Hospitalizations, surgeries, and ER visits in previous 12 months J.  [place] to include hearing, vision, cognitive, depression L. Referrals and appointments - none  In addition, I have reviewed and discussed with patient certain preventive protocols, quality metrics, and best practice recommendations. A written personalized care plan for preventive services as well as general preventive health recommendations were provided to patient.  See attached scanned questionnaire for additional information.   Signed,   Lindell Noe, MHA, BS, LPN Health Coach

## 2017-12-19 NOTE — Assessment & Plan Note (Signed)
Compliant to rosuvastatin 5 mg three days weekly. Continue same. LDL of 29 in October 2019.

## 2017-12-19 NOTE — Assessment & Plan Note (Signed)
Following with neurology 

## 2017-12-19 NOTE — Assessment & Plan Note (Signed)
Overall stable. Compliant to gabapentin and doing well.

## 2017-12-20 NOTE — Progress Notes (Signed)
I reviewed health advisor's note, was available for consultation, and agree with documentation and plan.  

## 2017-12-31 ENCOUNTER — Encounter: Payer: Self-pay | Admitting: Internal Medicine

## 2018-02-20 DIAGNOSIS — W19XXXA Unspecified fall, initial encounter: Secondary | ICD-10-CM

## 2018-02-20 HISTORY — DX: Unspecified fall, initial encounter: W19.XXXA

## 2018-03-07 ENCOUNTER — Emergency Department (HOSPITAL_COMMUNITY): Payer: Medicare Other

## 2018-03-07 ENCOUNTER — Encounter (HOSPITAL_COMMUNITY): Payer: Self-pay

## 2018-03-07 ENCOUNTER — Other Ambulatory Visit: Payer: Self-pay

## 2018-03-07 ENCOUNTER — Observation Stay (HOSPITAL_COMMUNITY)
Admission: EM | Admit: 2018-03-07 | Discharge: 2018-03-08 | Disposition: A | Discharge status: Home / Self Care | Payer: Medicare Other | Attending: Internal Medicine | Admitting: Internal Medicine

## 2018-03-07 DIAGNOSIS — Y92002 Bathroom of unspecified non-institutional (private) residence single-family (private) house as the place of occurrence of the external cause: Secondary | ICD-10-CM | POA: Diagnosis not present

## 2018-03-07 DIAGNOSIS — S060X9A Concussion with loss of consciousness of unspecified duration, initial encounter: Secondary | ICD-10-CM | POA: Diagnosis present

## 2018-03-07 DIAGNOSIS — Y999 Unspecified external cause status: Secondary | ICD-10-CM | POA: Insufficient documentation

## 2018-03-07 DIAGNOSIS — Z91040 Latex allergy status: Secondary | ICD-10-CM | POA: Insufficient documentation

## 2018-03-07 DIAGNOSIS — K219 Gastro-esophageal reflux disease without esophagitis: Secondary | ICD-10-CM | POA: Insufficient documentation

## 2018-03-07 DIAGNOSIS — R42 Dizziness and giddiness: Secondary | ICD-10-CM | POA: Diagnosis not present

## 2018-03-07 DIAGNOSIS — Z79899 Other long term (current) drug therapy: Secondary | ICD-10-CM | POA: Insufficient documentation

## 2018-03-07 DIAGNOSIS — D72829 Elevated white blood cell count, unspecified: Secondary | ICD-10-CM | POA: Insufficient documentation

## 2018-03-07 DIAGNOSIS — R55 Syncope and collapse: Secondary | ICD-10-CM | POA: Diagnosis present

## 2018-03-07 DIAGNOSIS — W1830XA Fall on same level, unspecified, initial encounter: Secondary | ICD-10-CM | POA: Diagnosis not present

## 2018-03-07 DIAGNOSIS — S0081XA Abrasion of other part of head, initial encounter: Principal | ICD-10-CM | POA: Insufficient documentation

## 2018-03-07 DIAGNOSIS — Z8673 Personal history of transient ischemic attack (TIA), and cerebral infarction without residual deficits: Secondary | ICD-10-CM | POA: Diagnosis not present

## 2018-03-07 DIAGNOSIS — E785 Hyperlipidemia, unspecified: Secondary | ICD-10-CM | POA: Diagnosis present

## 2018-03-07 DIAGNOSIS — G500 Trigeminal neuralgia: Secondary | ICD-10-CM | POA: Diagnosis not present

## 2018-03-07 DIAGNOSIS — S060X1A Concussion with loss of consciousness of 30 minutes or less, initial encounter: Secondary | ICD-10-CM | POA: Diagnosis not present

## 2018-03-07 DIAGNOSIS — M810 Age-related osteoporosis without current pathological fracture: Secondary | ICD-10-CM | POA: Diagnosis present

## 2018-03-07 DIAGNOSIS — Z87891 Personal history of nicotine dependence: Secondary | ICD-10-CM | POA: Diagnosis not present

## 2018-03-07 DIAGNOSIS — K589 Irritable bowel syndrome without diarrhea: Secondary | ICD-10-CM | POA: Insufficient documentation

## 2018-03-07 DIAGNOSIS — Y939 Activity, unspecified: Secondary | ICD-10-CM | POA: Diagnosis not present

## 2018-03-07 DIAGNOSIS — S0990XA Unspecified injury of head, initial encounter: Secondary | ICD-10-CM | POA: Diagnosis present

## 2018-03-07 DIAGNOSIS — Z85828 Personal history of other malignant neoplasm of skin: Secondary | ICD-10-CM | POA: Diagnosis not present

## 2018-03-07 LAB — URINALYSIS, ROUTINE W REFLEX MICROSCOPIC
Bilirubin Urine: NEGATIVE
Glucose, UA: NEGATIVE mg/dL
Ketones, ur: NEGATIVE mg/dL
Nitrite: NEGATIVE
Protein, ur: NEGATIVE mg/dL
Specific Gravity, Urine: 1.008 (ref 1.005–1.030)
pH: 7 (ref 5.0–8.0)

## 2018-03-07 LAB — I-STAT CHEM 8, ED
BUN: 21 mg/dL (ref 8–23)
Calcium, Ion: 1.13 mmol/L — ABNORMAL LOW (ref 1.15–1.40)
Chloride: 105 mmol/L (ref 98–111)
Creatinine, Ser: 0.9 mg/dL (ref 0.44–1.00)
Glucose, Bld: 115 mg/dL — ABNORMAL HIGH (ref 70–99)
HCT: 49 % — ABNORMAL HIGH (ref 36.0–46.0)
Hemoglobin: 16.7 g/dL — ABNORMAL HIGH (ref 12.0–15.0)
Potassium: 4.7 mmol/L (ref 3.5–5.1)
Sodium: 139 mmol/L (ref 135–145)
TCO2: 25 mmol/L (ref 22–32)

## 2018-03-07 LAB — COMPREHENSIVE METABOLIC PANEL
ALT: 11 U/L (ref 0–44)
AST: 19 U/L (ref 15–41)
Albumin: 4.3 g/dL (ref 3.5–5.0)
Alkaline Phosphatase: 58 U/L (ref 38–126)
Anion gap: 11 (ref 5–15)
BUN: 16 mg/dL (ref 8–23)
CO2: 24 mmol/L (ref 22–32)
Calcium: 9.1 mg/dL (ref 8.9–10.3)
Chloride: 103 mmol/L (ref 98–111)
Creatinine, Ser: 0.93 mg/dL (ref 0.44–1.00)
GFR calc Af Amer: >60 mL/min (ref >60)
GFR calc non Af Amer: 56 mL/min — ABNORMAL LOW (ref >60)
Glucose, Bld: 123 mg/dL — ABNORMAL HIGH (ref 70–99)
Potassium: 3.9 mmol/L (ref 3.5–5.1)
Sodium: 138 mmol/L (ref 135–145)
Total Bilirubin: 0.3 mg/dL (ref 0.3–1.2)
Total Protein: 7.4 g/dL (ref 6.5–8.1)

## 2018-03-07 LAB — I-STAT CG4 LACTIC ACID, ED
Lactic Acid, Venous: 2.42 mmol/L (ref 0.5–1.9)
Lactic Acid, Venous: 1.51 mmol/L (ref 0.5–1.9)

## 2018-03-07 LAB — CBC WITH DIFFERENTIAL/PLATELET
Abs Immature Granulocytes: 0.06 10*3/uL (ref 0.00–0.07)
Basophils Absolute: 0 10*3/uL (ref 0.0–0.1)
Basophils Relative: 0 %
Eosinophils Absolute: 0.1 10*3/uL (ref 0.0–0.5)
Eosinophils Relative: 0 %
HCT: 40.7 % (ref 36.0–46.0)
Hemoglobin: 13.1 g/dL (ref 12.0–15.0)
Immature Granulocytes: 0 %
Lymphocytes Relative: 13 %
Lymphs Abs: 1.8 10*3/uL (ref 0.7–4.0)
MCH: 30.2 pg (ref 26.0–34.0)
MCHC: 32.2 g/dL (ref 30.0–36.0)
MCV: 93.8 fL (ref 80.0–100.0)
Monocytes Absolute: 0.6 10*3/uL (ref 0.1–1.0)
Monocytes Relative: 4 %
Neutro Abs: 10.9 10*3/uL — ABNORMAL HIGH (ref 1.7–7.7)
Neutrophils Relative %: 83 %
Platelets: 156 10*3/uL (ref 150–400)
RBC: 4.34 MIL/uL (ref 3.87–5.11)
RDW: 13.1 % (ref 11.5–15.5)
WBC: 13.3 10*3/uL — ABNORMAL HIGH (ref 4.0–10.5)
nRBC: 0 % (ref 0.0–0.2)

## 2018-03-07 LAB — BRAIN NATRIURETIC PEPTIDE: B Natriuretic Peptide: 55 pg/mL (ref 0.0–100.0)

## 2018-03-07 LAB — I-STAT TROPONIN, ED
Troponin i, poc: 0 ng/mL (ref 0.00–0.08)
Troponin i, poc: 0 ng/mL (ref 0.00–0.08)

## 2018-03-07 LAB — MAGNESIUM: Magnesium: 2 mg/dL (ref 1.7–2.4)

## 2018-03-07 LAB — PHOSPHORUS: Phosphorus: 3.7 mg/dL (ref 2.5–4.6)

## 2018-03-07 MED ORDER — OXCARBAZEPINE 150 MG PO TABS
150.0000 mg | ORAL_TABLET | Freq: Two times a day (BID) | ORAL | Status: DC
  Administered 2018-03-08: 150 mg via ORAL
  Filled 2018-03-07 (×8): qty 1

## 2018-03-07 MED ORDER — SODIUM CHLORIDE 0.9 % IV BOLUS
500.0000 mL | Freq: Once | INTRAVENOUS | Status: AC
  Administered 2018-03-07: 500 mL via INTRAVENOUS

## 2018-03-07 MED ORDER — BLUE-EMU SUPER STRENGTH EX CREA: 1.0000 "application " | TOPICAL_CREAM | Freq: Three times a day (TID) | CUTANEOUS | Status: DC | PRN

## 2018-03-07 MED ORDER — MECLIZINE HCL 12.5 MG PO TABS: 25.0000 mg | ORAL_TABLET | Freq: Three times a day (TID) | ORAL | Status: DC | PRN

## 2018-03-07 MED ORDER — CALCIUM CARBONATE 1250 (500 CA) MG PO TABS: 1250.0000 mg | ORAL_TABLET | Freq: Every day | ORAL | Status: DC

## 2018-03-07 MED ORDER — ACETAMINOPHEN 325 MG PO TABS
650.0000 mg | ORAL_TABLET | Freq: Four times a day (QID) | ORAL | Status: DC | PRN
  Administered 2018-03-08 (×2): 650 mg via ORAL
  Filled 2018-03-07 (×2): qty 2

## 2018-03-07 MED ORDER — DIGESTIVE ADVANTAGE GUMMIES PO CHEW: 1.0000 | CHEWABLE_TABLET | Freq: Every day | ORAL | Status: DC

## 2018-03-07 MED ORDER — VITAMIN D 25 MCG (1000 UNIT) PO TABS: 5000.0000 [IU] | ORAL_TABLET | Freq: Every day | ORAL | Status: DC

## 2018-03-07 MED ORDER — ROSUVASTATIN CALCIUM 10 MG PO TABS: 5.0000 mg | ORAL_TABLET | ORAL | Status: DC

## 2018-03-07 MED ORDER — SODIUM CHLORIDE 0.9% FLUSH
3.0000 mL | Freq: Two times a day (BID) | INTRAVENOUS | Status: DC
  Administered 2018-03-08 (×2): 3 mL via INTRAVENOUS

## 2018-03-07 MED ORDER — LACOSAMIDE 50 MG PO TABS
100.0000 mg | ORAL_TABLET | Freq: Two times a day (BID) | ORAL | Status: DC
  Administered 2018-03-08: 100 mg via ORAL

## 2018-03-07 MED ORDER — HYOSCYAMINE SULFATE 0.125 MG PO TABS
0.1250 mg | ORAL_TABLET | ORAL | Status: DC | PRN
  Filled 2018-03-07: qty 1

## 2018-03-07 MED ORDER — PANTOPRAZOLE SODIUM 20 MG PO TBEC
20.0000 mg | DELAYED_RELEASE_TABLET | Freq: Two times a day (BID) | ORAL | Status: DC
  Filled 2018-03-07 (×8): qty 1

## 2018-03-07 MED ORDER — GABAPENTIN 100 MG PO CAPS
100.0000 mg | ORAL_CAPSULE | Freq: Every day | ORAL | Status: DC
  Administered 2018-03-08: 100 mg via ORAL

## 2018-03-07 MED ORDER — ACETAMINOPHEN 650 MG RE SUPP: 650.0000 mg | Freq: Four times a day (QID) | RECTAL | Status: DC | PRN

## 2018-03-07 NOTE — ED Notes (Signed)
Date and time results received: 03/07/18 9:16 PM  (use smartphrase ".now" to insert current time)  Test: I stat lactic Critical Value: 2.42  Name of Provider Notified: [email or web address]  Orders Received? Or Actions Taken?: none at this time.

## 2018-03-07 NOTE — ED Notes (Signed)
Lab in room.

## 2018-03-07 NOTE — ED Notes (Signed)
Pt too weak to complete ortho v/s at this time.

## 2018-03-07 NOTE — ED Notes (Signed)
Pt just voided in brief. reminded pt need of urine sample. Pt refused in and out cath.

## 2018-03-07 NOTE — H&P (Signed)
History and Physical    Shari Vincent WSF:[id number] DOB: [birth year] DOA: 03/07/2018  PCP: Shari Koch, NP   Patient coming from: Home.  I have personally briefly reviewed patient's old medical records in [place]  Chief Complaint: Fall.  History of present illness: Shari Vincent is a 85 y.o. female with medical history significant of GERD, osteoarthritis of the right knee, bulging lumbar disc, basal cell skin cancer of right shoulder, colon polyps, diverticulosis, history of diverticulitis, hyperlipidemia, IBS, urolithiasis, sciatica, history of CVA, history of subarachnoid hemorrhage, trigeminal neuralgia, history of vertigo who was brought to the emergency department after having a fall at home sustaining a laceration on her right frontal area and losing consciousness briefly.  Per patient, she went to the bathroom and had intense abdominal pain.  She thinks that she got up and does not remember what happened after that.  The next thing she remembers is the EMS crew tending to her.  She denies chest pain, palpitations, dizziness, diaphoresis, PND, orthopnea.  She denies fever, chills, sore throat, dyspnea, wheezing, hemoptysis.  No nausea, emesis, melena or hematochezia.  She gets diarrhea or constipation depending on her IBS symptomatology.  She denies dysuria, frequency or hematuria.  She denies polyuria, polydipsia, polyphagia or blurred vision.  ED Course: Initial vital signs temperature 96.3 F, pulse 76, respirations 14, blood pressure 140/87 mmHg and O2 sat 99% on room air.  Patient received a 500 mL NS bolus in the emergency department.  Her urinalysis was a hazy appearance, with small hemoglobinuria, trace leukocyte esterase and rare bacteria on microscopic examination.  Otherwise he was normal.  Initial lactic acid was 2.42 Follow-up was 1.51 mmol/L.  Her CBC shows a white count of 13.3 g/dL with 83% neutrophils, 13% lymphocytes and 4% monocytes.  Hemoglobin 13.1  g/dL and platelets 156.  Troponin x2 was negative.  BNP was normal at 55.0 pg/mL.  CMP shows a glucose of 123 mg/dL, all other values are within normal limits.  Imaging: CT head and CT cervical spine without contrast showed chronic involutional and degenerative changes, did not show any acute abnormality.  Review of Systems:  As per HPI above mentioned, otherwise is negative.  Past Medical History:  Diagnosis Date  . Acid reflux   . Arthritis    right knee  . Bulging lumbar disc   . Cancer ([place])    Basal Cell on right shoulder  . Colon polyps   . Diverticulitis   . High cholesterol   . Irritable bowel syndrome (IBS)   . Kidney stone   . Knee pain    right  . Sciatic pain   . Stroke ([place]) 09/2017  . Subarachnoid hemorrhage ([place])   . Trigeminal neuralgia   . Trigeminal neuralgia of right side of face   . Trigeminal neuralgia syndrome   . Vertigo     Past Surgical History:  Procedure Laterality Date  . BREAST BIOPSY Left   . BREAST SURGERY     biopsy in 1990   . CATARACT EXTRACTION    . COLONOSCOPY  05/27/2008   [id number] rectal polyp,s/p bx/sigmoid diverticula. hyperplastic polyps. benign random bx  . ESOPHAGOGASTRODUODENOSCOPY  05/27/2008   [id number] [facility]  . EYE SURGERY    . FOOT SURGERY     Left foot, metal plate  . HYSTEROSCOPY W/D&C N/A 09/03/2014   Procedure: DILATATION AND CURETTAGE /HYSTEROSCOPY;  Surgeon: Donnamae Jude, MD;  Location: [facility];  Service: Gynecology;  Laterality: N/A;  Requesting  09/03/14 @ 2:00p  . HYSTEROSCOPY W/D&C N/A 08/19/2015   Procedure: DILATATION AND CURETTAGE /HYSTEROSCOPY;  Surgeon: Donnamae Jude, MD;  Location: [facility];  Service: Gynecology;  Laterality: N/A;  . LITHOTRIPSY    . OTHER SURGICAL HISTORY     Uterine Prolapse/Tacking  . PROLAPSED UTERINE FIBROID LIGATION     in 1960  . TOOTH EXTRACTION  09/2017     reports that she has quit smoking. Her smoking use included cigarettes. She has never used smokeless tobacco. She  reports that she does not drink alcohol or use drugs.  Allergies  Allergen Reactions  . Albuterol Rash  . Cefdinir Diarrhea  . Lactose Intolerance (Gi) Nausea And Vomiting and Other (See Comments)    Patient has IBS  . Levofloxacin Nausea Only  . Other Nausea And Vomiting    No greasy foods!!  . Adhesive [Tape] Rash    Paper tape is tolerated  . Epinephrine Anxiety and Palpitations  . Latex Rash    Family History  Problem Relation Age of Onset  . Diabetes Mother   . Heart disease Mother        MI  . Uterine cancer Mother   . Stroke Father   . Cancer Paternal Grandmother        intestinal  . Stomach cancer Maternal Grandmother   . Colon cancer Neg Hx   . Breast cancer Neg Hx    Prior to Admission medications   Medication Sig Start Date End Date Taking? Authorizing Provider  calcium carbonate (OS-CAL) 1250 (500 Ca) MG chewable tablet Chew 1 tablet by mouth daily with supper.   Yes [provider]  Cholecalciferol (D3 MAXIMUM STRENGTH) 5000 units capsule Take 5,000 Units by mouth daily with lunch.    Yes [provider]  gabapentin (NEURONTIN) 100 MG capsule TAKE 1 CAPSULE BY MOUTH ONCE DAILY AT BEDTIME Patient taking differently: Take 100 mg by mouth at bedtime.  10/26/17  Yes Shari Koch, NP  hyoscyamine (LEVSIN, ANASPAZ) 0.125 MG tablet Take 0.125 mg by mouth every 4 (four) hours as needed (ibs/stomach pain).    Yes [provider]  Lacosamide (VIMPAT) 100 MG TABS Take 1 tablet (100 mg total) by mouth 2 (two) times daily. 10/30/17  Yes Melvenia Beam, MD  Liniments (BLUE-EMU SUPER STRENGTH) CREA Apply 1 application topically 3 (three) times daily as needed (for knee and back pain).    Yes [provider]  meclizine (ANTIVERT) 25 MG tablet Take 25 mg by mouth 3 (three) times daily as needed for dizziness (vertigo). Reported on 05/13/2015   Yes [provider]  OXcarbazepine (TRILEPTAL) 150 MG tablet Take 1 tablet (150 mg total)  by mouth 2 (two) times daily. 10/30/17  Yes Melvenia Beam, MD  pantoprazole (PROTONIX) 20 MG tablet TAKE 1 TABLET BY MOUTH TWICE DAILY Patient taking differently: Take 20 mg by mouth 2 (two) times daily.  08/22/17  Yes Annitta Needs, NP  Probiotic Product (DIGESTIVE ADVANTAGE GUMMIES) CHEW Chew 1 tablet by mouth daily.   Yes [provider]  rosuvastatin (CRESTOR) 5 MG tablet Take on Monday, Wednesday, Friday at bedtime for cholesterol. Patient taking differently: Take 5 mg by mouth every Monday, Wednesday, and Friday. Take on Monday, Wednesday, Friday at bedtime for cholesterol. 10/30/17  Yes Melvenia Beam, MD    Physical Exam: Vitals:   03/07/18 2130 03/07/18 2145 03/07/18 2200 03/07/18 2249  BP: 129/60  (!) 114/46   Pulse:  83  Resp:  16    Temp:    97.7 F (36.5 C)  TempSrc:    Rectal  SpO2:  96%      Constitutional: NAD, calm, comfortable Eyes: PERRL, lids and conjunctivae normal ENMT: Mucous membranes are moist. Posterior pharynx clear of any exudate or lesions. Neck: normal, supple, no masses, no thyromegaly Respiratory: Clear to auscultation bilaterally, no wheezing, no crackles. Normal respiratory effort. No accessory muscle use.  Cardiovascular: Regular rate and rhythm, no murmurs / rubs / gallops. No extremity edema. 2+ pedal pulses. No carotid bruits.  Abdomen: Soft, no tenderness, no masses palpated. No hepatosplenomegaly. Bowel sounds positive.  Musculoskeletal: no clubbing / cyanosis. Good ROM, no contractures. Normal muscle tone.  Skin: Positive contusion with  surrounding ecchymosis, erythema, TTP with a 7 to 8 cm long superficial laceration right-sided frontal area.  See picture below. Neurologic: CN 2-12 grossly intact. Sensation intact, DTR normal. Strength 5/5 in all 4.  Psychiatric: Normal judgment and insight. Alert and oriented x 3. Normal mood.      Labs on Admission: I have personally reviewed following labs and imaging studies  CBC: Recent  Labs  Lab 03/07/18 2054 03/07/18 2140  WBC 13.3*  --   NEUTROABS 10.9*  --   HGB 13.1 16.7*  HCT 40.7 49.0*  MCV 93.8  --   PLT 156  --    Basic Metabolic Panel: Recent Labs  Lab 03/07/18 2054 03/07/18 2140  NA 138 139  K 3.9 4.7  CL 103 105  CO2 24  --   GLUCOSE 123* 115*  BUN 16 21  CREATININE 0.93 0.90  CALCIUM 9.1  --   MG 2.0  --   PHOS 3.7  --    GFR: CrCl cannot be calculated (Unknown ideal weight.). Liver Function Tests: Recent Labs  Lab 03/07/18 2054  AST 19  ALT 11  ALKPHOS 58  BILITOT 0.3  PROT 7.4  ALBUMIN 4.3   No results for input(s): LIPASE, AMYLASE in the last 168 hours. No results for input(s): AMMONIA in the last 168 hours. Coagulation Profile: No results for input(s): INR, PROTIME in the last 168 hours. Cardiac Enzymes: No results for input(s): CKTOTAL, CKMB, CKMBINDEX, TROPONINI in the last 168 hours. BNP (last 3 results) No results for input(s): PROBNP in the last 8760 hours. HbA1C: No results for input(s): HGBA1C in the last 72 hours. CBG: No results for input(s): GLUCAP in the last 168 hours. Lipid Profile: No results for input(s): CHOL, HDL, LDLCALC, TRIG, CHOLHDL, LDLDIRECT in the last 72 hours. Thyroid Function Tests: No results for input(s): TSH, T4TOTAL, FREET4, T3FREE, THYROIDAB in the last 72 hours. Anemia Panel: No results for input(s): VITAMINB12, FOLATE, FERRITIN, TIBC, IRON, RETICCTPCT in the last 72 hours. Urine analysis:    Component Value Date/Time   COLORURINE STRAW (A) 06/25/2016 1200   APPEARANCEUR CLEAR 06/25/2016 1200   LABSPEC 1.009 06/25/2016 1200   PHURINE 7.0 06/25/2016 1200   GLUCOSEU NEGATIVE 06/25/2016 1200   HGBUR NEGATIVE 06/25/2016 1200   BILIRUBINUR negative 10/04/2016 1427   KETONESUR NEGATIVE 06/25/2016 1200   PROTEINUR negative 10/04/2016 1427   PROTEINUR NEGATIVE 06/25/2016 1200   UROBILINOGEN 0.2 10/04/2016 1427   NITRITE negative 10/04/2016 1427   NITRITE NEGATIVE 06/25/2016 1200    LEUKOCYTESUR Negative 10/04/2016 1427    Radiological Exams on Admission: Ct Head Wo Contrast  Result Date: 03/07/2018 CLINICAL DATA:  Syncopal episode resulting in a fall at home in the bathroom with a laceration to the middle  of the forehead. EXAM: CT HEAD WITHOUT CONTRAST CT CERVICAL SPINE WITHOUT CONTRAST TECHNIQUE: Multidetector CT imaging of the head and cervical spine was performed following the standard protocol without intravenous contrast. Multiplanar CT image reconstructions of the cervical spine were also generated. COMPARISON:  Brain MR dated 09/27/2017. Head and neck CTA dated 09/27/2017. Head CT dated 09/27/2017. FINDINGS: CT HEAD FINDINGS Brain: Moderately enlarged ventricles and subarachnoid spaces. Moderate patchy white matter low density in both cerebral hemispheres. No intracranial hemorrhage, mass lesion or CT evidence of acute infarction. Vascular: No hyperdense vessel or unexpected calcification. Skull: Normal. Negative for fracture or focal lesion. Sinuses/Orbits: Status post bilateral cataract extraction. Mild right maxillary sinus mucosal thickening. Other: Bilateral intravenous air at the skull base and posterior, superior left orbit, compatible with recent intravenous access. Small right frontal scalp hematoma and laceration with soft tissue air. CT CERVICAL SPINE FINDINGS Alignment: Normal. Skull base and vertebrae: No acute fracture. No primary bone lesion or focal pathologic process. Soft tissues and spinal canal: No prevertebral fluid or swelling. No visible canal hematoma. Disc levels:  Multilevel degenerative changes. Upper chest: Clear lung apices. Other: Bilateral intravenous air compatible with recent intravenous access. Bilateral carotid artery calcifications. IMPRESSION: 1. No skull fracture or intracranial hemorrhage. 2. No cervical spine fracture or subluxation. 3. Moderate diffuse cerebral and cerebellar atrophy. 4. Moderate chronic small vessel white matter ischemic  changes in both cerebral hemispheres. 5. Multilevel cervical spine degenerative changes. 6. Bilateral carotid artery atheromatous calcifications. Electronically Signed   By: Claudie Revering M.D.   On: 03/07/2018 22:01   Ct Cervical Spine Wo Contrast  Result Date: 03/07/2018 CLINICAL DATA:  Syncopal episode resulting in a fall at home in the bathroom with a laceration to the middle of the forehead. EXAM: CT HEAD WITHOUT CONTRAST CT CERVICAL SPINE WITHOUT CONTRAST TECHNIQUE: Multidetector CT imaging of the head and cervical spine was performed following the standard protocol without intravenous contrast. Multiplanar CT image reconstructions of the cervical spine were also generated. COMPARISON:  Brain MR dated 09/27/2017. Head and neck CTA dated 09/27/2017. Head CT dated 09/27/2017. FINDINGS: CT HEAD FINDINGS Brain: Moderately enlarged ventricles and subarachnoid spaces. Moderate patchy white matter low density in both cerebral hemispheres. No intracranial hemorrhage, mass lesion or CT evidence of acute infarction. Vascular: No hyperdense vessel or unexpected calcification. Skull: Normal. Negative for fracture or focal lesion. Sinuses/Orbits: Status post bilateral cataract extraction. Mild right maxillary sinus mucosal thickening. Other: Bilateral intravenous air at the skull base and posterior, superior left orbit, compatible with recent intravenous access. Small right frontal scalp hematoma and laceration with soft tissue air. CT CERVICAL SPINE FINDINGS Alignment: Normal. Skull base and vertebrae: No acute fracture. No primary bone lesion or focal pathologic process. Soft tissues and spinal canal: No prevertebral fluid or swelling. No visible canal hematoma. Disc levels:  Multilevel degenerative changes. Upper chest: Clear lung apices. Other: Bilateral intravenous air compatible with recent intravenous access. Bilateral carotid artery calcifications. IMPRESSION: 1. No skull fracture or intracranial hemorrhage. 2.  No cervical spine fracture or subluxation. 3. Moderate diffuse cerebral and cerebellar atrophy. 4. Moderate chronic small vessel white matter ischemic changes in both cerebral hemispheres. 5. Multilevel cervical spine degenerative changes. 6. Bilateral carotid artery atheromatous calcifications. Electronically Signed   By: Claudie Revering M.D.   On: 03/07/2018 22:01   Dg Chest Portable 1 View  Result Date: 03/07/2018 CLINICAL DATA:  Syncopal episode. EXAM: PORTABLE CHEST 1 VIEW COMPARISON:  09/27/2017 FINDINGS: 2030 hours. Low lung volumes. The cardio pericardial  silhouette is enlarged. There is pulmonary vascular congestion without overt pulmonary edema. Interstitial pulmonary edema suspected. No substantial pleural effusion. The visualized bony structures of the thorax are intact. Telemetry leads overlie the chest. IMPRESSION: Low volume film with cardiomegaly and vascular congestion. Likely associated interstitial edema. Electronically Signed   By: Misty Stanley M.D.   On: 03/07/2018 20:46   Echo 10/29/2017  ------------------------------------------------------------------- LV EF: 55% -   60%  ------------------------------------------------------------------- Indications:      CVA 436.  ------------------------------------------------------------------- History:   Risk factors:  Hypertension. Diabetes mellitus.  ------------------------------------------------------------------- Study Conclusions  - Left ventricle: The cavity size was normal. Systolic function was   normal. The estimated ejection fraction was in the range of 55%   to 60%. Wall motion was normal; there were no regional wall   motion abnormalities. The study is indeterminate for the   evaluation of LV diastolic function, though tissue doppler   imaging suggests impaired relaxation. - Ventricular septum: Septal motion showed mild dyssynergy. These   changes are consistent with intraventricular conduction delay. - Aortic  valve: Valve mobility was mildly restricted. There was   mild stenosis. There was no significant regurgitation. Peak   velocity (S): 212 cm/s. Mean gradient (S): 10 mm Hg. Peak   gradient (S): 18 mm Hg. Valve area (VTI): 1.1 cm^2. Valve area   (Vmax): 1.14 cm^2. Valve area (Vmean): 1.23 cm^2. - Mitral valve: Calcified annulus. There was trivial regurgitation. - Tricuspid valve: There was trivial regurgitation. - Pulmonic valve: There was mild regurgitation. - Pulmonary arteries: PA peak pressure: 30 mm Hg (S).  Impressions:  - No cardiac source of emboli was indentified.  EKG: Independently reviewed.  Vent. rate 74 BPM PR interval * ms QRS duration 145 ms QT/QTc 452/502 ms P-R-T axes 72 3 60 Sinus rhythm Prolonged PR interval Probable left atrial enlargement Left bundle branch block Baseline wander in lead(s) II III aVF  Assessment/Plan Principal Problem:   Syncope Observation/telemetry. Supplemental oxygen as needed. Fall precaution. Trend troponin levels. Check echocardiogram.  Active Problems:   Concussion with brief loss of consciousness Analgesics as needed (due to history of SAH patient and family prefer only Tylenol). Frequent neuro checks.    Leukocytosis Likely due to acute stress. Monitor clinically. Follow-up white count.    Hyperlipidemia Continue Crestor 5 mg 3 times a week.    GERD Continue pantoprazole 20 mg p.o. twice daily.    VERTIGO Continue meclizine 25 mg p.o. 3 times daily PRN.    IBS (irritable bowel syndrome) Continue Anaspaz as needed.    Trigeminal neuralgia Continue Trileptal and Vimpat.    Osteoporosis Continue calcium and vitamin D supplements.     DVT prophylaxis: SCDs. Code Status: DNR. Family Communication: Her daughter [name] was present in her room. Disposition Plan: Observation for syncope work-up. Consults called: Admission status: Observation/telemetry.   Reubin Milan MD [facility]  If  7PM-7AM, please contact night-coverage  03/07/2018, 11:07 PM   This document was prepared using Dragon voice recognition software and may contain some unintentional transcription errors.

## 2018-03-07 NOTE — ED Triage Notes (Signed)
Pt from home, in by [facility] after a syncopal episode and subsequent fall at home while in the bathroom.  Pt has a laceration to the middle of her forehead.  Pt arrives in c-collar.

## 2018-03-07 NOTE — ED Provider Notes (Signed)
[facility] EMERGENCY DEPARTMENT Provider Note   CSN: [id number] Arrival date & time: 03/07/18  2001     History   Chief Complaint Chief Complaint  Patient presents with  . Loss of Consciousness  . Fall    HPI Shari Vincent is a 85 y.o. female.  Patient passed out in the bathroom hit her head does not remember what happened.  Her son found her but she was awake in the floor when she was found  The history is provided by the patient and a relative. No language interpreter was used.  Loss of Consciousness  Episode history:  Single Most recent episode:  Today Duration:  10 seconds Timing:  Rare Progression:  Resolved Chronicity:  New Context: not blood draw   Witnessed: no   Relieved by:  None tried Worsened by:  Nothing Ineffective treatments:  None tried Associated symptoms: no anxiety, no chest pain, no headaches and no seizures   Fall  Pertinent negatives include no chest pain, no abdominal pain and no headaches.    Past Medical History:  Diagnosis Date  . Acid reflux   . Arthritis    right knee  . Bulging lumbar disc   . Cancer ([place])    Basal Cell on right shoulder  . Colon polyps   . Diverticulitis   . High cholesterol   . Irritable bowel syndrome (IBS)   . Kidney stone   . Knee pain    right  . Sciatic pain   . Stroke ([place]) 09/2017  . Subarachnoid hemorrhage ([place])   . Trigeminal neuralgia   . Trigeminal neuralgia of right side of face   . Trigeminal neuralgia syndrome   . Vertigo     Patient Active Problem List   Diagnosis Date Noted  . Syncope 03/07/2018  . TIA (transient ischemic attack) 09/27/2017  . Preventative health care 12/13/2016  . Urinary incontinence 10/04/2016  . Acute cerebrovascul insuff, transient focal neurologic signs/symptoms 06/27/2016  . Family hx-stroke 06/27/2016  . Cerebral amyloid angiopathy (CODE) 06/26/2016  . 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 Subarachnoid hemorrhage ([place]) 06/25/2016  . Upper extremity  weakness 06/23/2016  . Osteoporosis 10/22/2014  . Pelvic mass in female 08/25/2014  . PMB (postmenopausal bleeding) 08/06/2014  . Medicare annual wellness visit, subsequent 06/17/2014  . IBS (irritable bowel syndrome) 05/26/2014  . Right-sided low back pain with right-sided sciatica 05/26/2014  . Trigeminal neuralgia 05/26/2014  . OA (osteoarthritis) of knee 07/23/2012  . Venous stasis 07/23/2012  . COLONIC POLYPS, HYPERPLASTIC 08/14/2008  . Hyperlipidemia 08/14/2008  . GERD 08/14/2008  . VERTIGO 08/14/2008    Past Surgical History:  Procedure Laterality Date  . BREAST BIOPSY Left   . BREAST SURGERY     biopsy in 1990   . CATARACT EXTRACTION    . COLONOSCOPY  05/27/2008   NTI:[id number] rectal polyp,s/p bx/sigmoid diverticula. hyperplastic polyps. benign random bx  . ESOPHAGOGASTRODUODENOSCOPY  05/27/2008   QPY:PPJKDT/OIZTI [facility]  . EYE SURGERY    . FOOT SURGERY     Left foot, metal plate  . HYSTEROSCOPY W/D&C N/A 09/03/2014   Procedure: DILATATION AND CURETTAGE /HYSTEROSCOPY;  Surgeon: Donnamae Jude, MD;  Location: [facility];  Service: Gynecology;  Laterality: N/A;  Requesting 09/03/14 @ 2:00p  . HYSTEROSCOPY W/D&C N/A 08/19/2015   Procedure: DILATATION AND CURETTAGE /HYSTEROSCOPY;  Surgeon: Donnamae Jude, MD;  Location: [facility];  Service: Gynecology;  Laterality: N/A;  . LITHOTRIPSY    . OTHER SURGICAL HISTORY     Uterine  Prolapse/Tacking  . PROLAPSED UTERINE FIBROID LIGATION     in 1960  . TOOTH EXTRACTION  09/2017     OB History    Gravida  5   Para  3   Term      Preterm  3   AB  2   Living  3     SAB  2   TAB      Ectopic      Multiple      Live Births               Home Medications    Prior to Admission medications   Medication Sig Start Date End Date Taking? Authorizing Provider  calcium carbonate (OS-CAL) 1250 (500 Ca) MG chewable tablet Chew 1 tablet by mouth daily with supper.   Yes [provider]  Cholecalciferol (D3 MAXIMUM  STRENGTH) 5000 units capsule Take 5,000 Units by mouth daily with lunch.    Yes [provider]  gabapentin (NEURONTIN) 100 MG capsule TAKE 1 CAPSULE BY MOUTH ONCE DAILY AT BEDTIME Patient taking differently: Take 100 mg by mouth at bedtime.  10/26/17  Yes Pleas Koch, NP  hyoscyamine (LEVSIN, ANASPAZ) 0.125 MG tablet Take 0.125 mg by mouth every 4 (four) hours as needed (ibs/stomach pain).    Yes [provider]  Lacosamide (VIMPAT) 100 MG TABS Take 1 tablet (100 mg total) by mouth 2 (two) times daily. 10/30/17  Yes Melvenia Beam, MD  Liniments (BLUE-EMU SUPER STRENGTH) CREA Apply 1 application topically 3 (three) times daily as needed (for knee and back pain).    Yes [provider]  meclizine (ANTIVERT) 25 MG tablet Take 25 mg by mouth 3 (three) times daily as needed for dizziness (vertigo). Reported on 05/13/2015   Yes [provider]  OXcarbazepine (TRILEPTAL) 150 MG tablet Take 1 tablet (150 mg total) by mouth 2 (two) times daily. 10/30/17  Yes Melvenia Beam, MD  pantoprazole (PROTONIX) 20 MG tablet TAKE 1 TABLET BY MOUTH TWICE DAILY Patient taking differently: Take 20 mg by mouth 2 (two) times daily.  08/22/17  Yes Annitta Needs, NP  Probiotic Product (DIGESTIVE ADVANTAGE GUMMIES) CHEW Chew 1 tablet by mouth daily.   Yes [provider]  rosuvastatin (CRESTOR) 5 MG tablet Take on Monday, Wednesday, Friday at bedtime for cholesterol. Patient taking differently: Take 5 mg by mouth every Monday, Wednesday, and Friday. Take on Monday, Wednesday, Friday at bedtime for cholesterol. 10/30/17  Yes Melvenia Beam, MD    Family History Family History  Problem Relation Age of Onset  . Diabetes Mother   . Heart disease Mother        MI  . Uterine cancer Mother   . Stroke Father   . Cancer Paternal Grandmother        intestinal  . Stomach cancer Maternal Grandmother   . Colon cancer Neg Hx   . Breast cancer Neg Hx     Social History Social  History   Tobacco Use  . Smoking status: Former Smoker    Types: Cigarettes  . Smokeless tobacco: Never Used  Substance Use Topics  . Alcohol use: No  . Drug use: No     Allergies   Albuterol; Cefdinir; Lactose intolerance (gi); Levofloxacin; Other; Adhesive [tape]; Epinephrine; and Latex   Review of Systems Review of Systems  Constitutional: Negative for appetite change and fatigue.  HENT: Negative for congestion, ear discharge and sinus pressure.   Eyes: Negative for  discharge.  Respiratory: Negative for cough.   Cardiovascular: Positive for syncope. Negative for chest pain.  Gastrointestinal: Negative for abdominal pain and diarrhea.  Genitourinary: Negative for frequency and hematuria.  Musculoskeletal: Negative for back pain.  Skin: Negative for rash.  Neurological: Positive for syncope. Negative for seizures and headaches.  Psychiatric/Behavioral: Negative for hallucinations.     Physical Exam Updated Vital Signs BP (!) 114/46   Pulse 83   Temp (!) 96.3 F (35.7 C) (Rectal)   Resp 16   SpO2 96%   Physical Exam Vitals signs and nursing note reviewed.  Constitutional:      Appearance: She is well-developed.  HENT:     Head: Normocephalic.     Comments: Abrasion to forehead    Nose: Nose normal.  Eyes:     General: No scleral icterus.    Conjunctiva/sclera: Conjunctivae normal.  Neck:     Musculoskeletal: Neck supple.     Thyroid: No thyromegaly.  Cardiovascular:     Rate and Rhythm: Normal rate and regular rhythm.     Heart sounds: No murmur. No friction rub. No gallop.   Pulmonary:     Breath sounds: No stridor. No wheezing or rales.  Chest:     Chest wall: No tenderness.  Abdominal:     General: There is no distension.     Tenderness: There is no abdominal tenderness. There is no rebound.  Musculoskeletal: Normal range of motion.  Lymphadenopathy:     Cervical: No cervical adenopathy.  Skin:    Findings: No erythema or rash.  Neurological:       Mental Status: She is alert and oriented to person, place, and time.     Motor: No abnormal muscle tone.     Coordination: Coordination normal.  Psychiatric:        Behavior: Behavior normal.      ED Treatments / Results  Labs (all labs ordered are listed, but only abnormal results are displayed) Labs Reviewed  CBC WITH DIFFERENTIAL/PLATELET - Abnormal; Notable for the following components:      Result Value   WBC 13.3 (*)    Neutro Abs 10.9 (*)    All other components within normal limits  COMPREHENSIVE METABOLIC PANEL - Abnormal; Notable for the following components:   Glucose, Bld 123 (*)    GFR calc non Af Amer 56 (*)    All other components within normal limits  I-STAT CHEM 8, ED - Abnormal; Notable for the following components:   Glucose, Bld 115 (*)    Calcium, Ion 1.13 (*)    Hemoglobin 16.7 (*)    HCT 49.0 (*)    All other components within normal limits  I-STAT CG4 LACTIC ACID, ED - Abnormal; Notable for the following components:   Lactic Acid, Venous 2.42 (*)    All other components within normal limits  URINALYSIS, ROUTINE W REFLEX MICROSCOPIC  MAGNESIUM  PHOSPHORUS  I-STAT TROPONIN, ED  I-STAT CG4 LACTIC ACID, ED    EKG None  Radiology Ct Head Wo Contrast  Result Date: 03/07/2018 CLINICAL DATA:  Syncopal episode resulting in a fall at home in the bathroom with a laceration to the middle of the forehead. EXAM: CT HEAD WITHOUT CONTRAST CT CERVICAL SPINE WITHOUT CONTRAST TECHNIQUE: Multidetector CT imaging of the head and cervical spine was performed following the standard protocol without intravenous contrast. Multiplanar CT image reconstructions of the cervical spine were also generated. COMPARISON:  Brain MR dated 09/27/2017. Head and neck CTA dated 09/27/2017.  Head CT dated 09/27/2017. FINDINGS: CT HEAD FINDINGS Brain: Moderately enlarged ventricles and subarachnoid spaces. Moderate patchy white matter low density in both cerebral hemispheres. No  intracranial hemorrhage, mass lesion or CT evidence of acute infarction. Vascular: No hyperdense vessel or unexpected calcification. Skull: Normal. Negative for fracture or focal lesion. Sinuses/Orbits: Status post bilateral cataract extraction. Mild right maxillary sinus mucosal thickening. Other: Bilateral intravenous air at the skull base and posterior, superior left orbit, compatible with recent intravenous access. Small right frontal scalp hematoma and laceration with soft tissue air. CT CERVICAL SPINE FINDINGS Alignment: Normal. Skull base and vertebrae: No acute fracture. No primary bone lesion or focal pathologic process. Soft tissues and spinal canal: No prevertebral fluid or swelling. No visible canal hematoma. Disc levels:  Multilevel degenerative changes. Upper chest: Clear lung apices. Other: Bilateral intravenous air compatible with recent intravenous access. Bilateral carotid artery calcifications. IMPRESSION: 1. No skull fracture or intracranial hemorrhage. 2. No cervical spine fracture or subluxation. 3. Moderate diffuse cerebral and cerebellar atrophy. 4. Moderate chronic small vessel white matter ischemic changes in both cerebral hemispheres. 5. Multilevel cervical spine degenerative changes. 6. Bilateral carotid artery atheromatous calcifications. Electronically Signed   By: Claudie Revering M.D.   On: 03/07/2018 22:01   Ct Cervical Spine Wo Contrast  Result Date: 03/07/2018 CLINICAL DATA:  Syncopal episode resulting in a fall at home in the bathroom with a laceration to the middle of the forehead. EXAM: CT HEAD WITHOUT CONTRAST CT CERVICAL SPINE WITHOUT CONTRAST TECHNIQUE: Multidetector CT imaging of the head and cervical spine was performed following the standard protocol without intravenous contrast. Multiplanar CT image reconstructions of the cervical spine were also generated. COMPARISON:  Brain MR dated 09/27/2017. Head and neck CTA dated 09/27/2017. Head CT dated 09/27/2017. FINDINGS: CT  HEAD FINDINGS Brain: Moderately enlarged ventricles and subarachnoid spaces. Moderate patchy white matter low density in both cerebral hemispheres. No intracranial hemorrhage, mass lesion or CT evidence of acute infarction. Vascular: No hyperdense vessel or unexpected calcification. Skull: Normal. Negative for fracture or focal lesion. Sinuses/Orbits: Status post bilateral cataract extraction. Mild right maxillary sinus mucosal thickening. Other: Bilateral intravenous air at the skull base and posterior, superior left orbit, compatible with recent intravenous access. Small right frontal scalp hematoma and laceration with soft tissue air. CT CERVICAL SPINE FINDINGS Alignment: Normal. Skull base and vertebrae: No acute fracture. No primary bone lesion or focal pathologic process. Soft tissues and spinal canal: No prevertebral fluid or swelling. No visible canal hematoma. Disc levels:  Multilevel degenerative changes. Upper chest: Clear lung apices. Other: Bilateral intravenous air compatible with recent intravenous access. Bilateral carotid artery calcifications. IMPRESSION: 1. No skull fracture or intracranial hemorrhage. 2. No cervical spine fracture or subluxation. 3. Moderate diffuse cerebral and cerebellar atrophy. 4. Moderate chronic small vessel white matter ischemic changes in both cerebral hemispheres. 5. Multilevel cervical spine degenerative changes. 6. Bilateral carotid artery atheromatous calcifications. Electronically Signed   By: Claudie Revering M.D.   On: 03/07/2018 22:01   Dg Chest Portable 1 View  Result Date: 03/07/2018 CLINICAL DATA:  Syncopal episode. EXAM: PORTABLE CHEST 1 VIEW COMPARISON:  09/27/2017 FINDINGS: 2030 hours. Low lung volumes. The cardio pericardial silhouette is enlarged. There is pulmonary vascular congestion without overt pulmonary edema. Interstitial pulmonary edema suspected. No substantial pleural effusion. The visualized bony structures of the thorax are intact. Telemetry  leads overlie the chest. IMPRESSION: Low volume film with cardiomegaly and vascular congestion. Likely associated interstitial edema. Electronically Signed   By: Randall Hiss  Tery Sanfilippo M.D.   On: 03/07/2018 20:46    Procedures Procedures (including critical care time)  Medications Ordered in ED Medications  sodium chloride 0.9 % bolus 500 mL (500 mLs Intravenous New Bag/Given 03/07/18 2118)     Initial Impression / Assessment and Plan / ED Course  I have reviewed the triage vital signs and the nursing notes.  Pertinent labs & imaging results that were available during my care of the patient were reviewed by me and considered in my medical decision making (see chart for details).     Patient with syncope unknown cause she will be admitted to medicine for further evaluation  Final Clinical Impressions(s) / ED Diagnoses   Final diagnoses:  Syncope and collapse    ED Discharge Orders    None       Milton Ferguson, MD 03/07/18 2237

## 2018-03-07 NOTE — ED Notes (Signed)
BMx2

## 2018-03-08 ENCOUNTER — Observation Stay (HOSPITAL_COMMUNITY): Payer: Medicare Other

## 2018-03-08 ENCOUNTER — Observation Stay (HOSPITAL_BASED_OUTPATIENT_CLINIC_OR_DEPARTMENT_OTHER): Payer: Medicare Other

## 2018-03-08 DIAGNOSIS — S060X9A Concussion with loss of consciousness of unspecified duration, initial encounter: Secondary | ICD-10-CM | POA: Diagnosis present

## 2018-03-08 DIAGNOSIS — S060X1A Concussion with loss of consciousness of 30 minutes or less, initial encounter: Secondary | ICD-10-CM | POA: Diagnosis present

## 2018-03-08 DIAGNOSIS — K219 Gastro-esophageal reflux disease without esophagitis: Secondary | ICD-10-CM | POA: Diagnosis not present

## 2018-03-08 DIAGNOSIS — E785 Hyperlipidemia, unspecified: Secondary | ICD-10-CM | POA: Diagnosis not present

## 2018-03-08 DIAGNOSIS — R55 Syncope and collapse: Secondary | ICD-10-CM | POA: Diagnosis not present

## 2018-03-08 DIAGNOSIS — S0081XA Abrasion of other part of head, initial encounter: Secondary | ICD-10-CM | POA: Diagnosis not present

## 2018-03-08 HISTORY — DX: Concussion with loss of consciousness of unspecified duration, initial encounter: S06.0X9A

## 2018-03-08 HISTORY — DX: Concussion with loss of consciousness of 30 minutes or less, initial encounter: S06.0X1A

## 2018-03-08 LAB — CBC WITH DIFFERENTIAL/PLATELET
Abs Immature Granulocytes: 0.03 10*3/uL (ref 0.00–0.07)
Basophils Absolute: 0 10*3/uL (ref 0.0–0.1)
Basophils Relative: 0 %
Eosinophils Absolute: 0 10*3/uL (ref 0.0–0.5)
Eosinophils Relative: 0 %
HCT: 37 % (ref 36.0–46.0)
Hemoglobin: 11.9 g/dL — ABNORMAL LOW (ref 12.0–15.0)
Immature Granulocytes: 0 %
Lymphocytes Relative: 20 %
Lymphs Abs: 2.1 10*3/uL (ref 0.7–4.0)
MCH: 30.2 pg (ref 26.0–34.0)
MCHC: 32.2 g/dL (ref 30.0–36.0)
MCV: 93.9 fL (ref 80.0–100.0)
Monocytes Absolute: 0.9 10*3/uL (ref 0.1–1.0)
Monocytes Relative: 9 %
Neutro Abs: 7.7 10*3/uL (ref 1.7–7.7)
Neutrophils Relative %: 71 %
Platelets: 144 10*3/uL — ABNORMAL LOW (ref 150–400)
RBC: 3.94 MIL/uL (ref 3.87–5.11)
RDW: 13.1 % (ref 11.5–15.5)
WBC: 10.9 10*3/uL — ABNORMAL HIGH (ref 4.0–10.5)
nRBC: 0 % (ref 0.0–0.2)

## 2018-03-08 LAB — MRSA PCR SCREENING: MRSA by PCR: NEGATIVE

## 2018-03-08 LAB — TROPONIN I
Troponin I: <0.03 ng/mL (ref <0.03)
Troponin I: <0.03 ng/mL (ref <0.03)

## 2018-03-08 LAB — ECHOCARDIOGRAM COMPLETE
Height: 59 in
Weight: 2557.34 oz

## 2018-03-08 LAB — GLUCOSE, CAPILLARY: Glucose-Capillary: 114 mg/dL — ABNORMAL HIGH (ref 70–99)

## 2018-03-08 MED ORDER — PANTOPRAZOLE SODIUM 40 MG PO TBEC
40.0000 mg | DELAYED_RELEASE_TABLET | Freq: Every day | ORAL | Status: DC
  Administered 2018-03-08: 40 mg via ORAL

## 2018-03-08 NOTE — Plan of Care (Signed)
  Problem: Acute Rehab PT Goals(only PT should resolve) Goal: Pt Will Go Sit To Supine/Side Outcome: Progressing Flowsheets (Taken 03/08/2018 1058) Pt will go Sit to Supine/Side: with modified independence Goal: Pt Will Transfer Bed To Chair/Chair To Bed Outcome: Progressing Flowsheets (Taken 03/08/2018 1058) Pt will Transfer Bed to Chair/Chair to Bed: with modified independence Goal: Pt Will Ambulate Outcome: Progressing Flowsheets (Taken 03/08/2018 1058) Pt will Ambulate: 100 feet; with supervision; with rolling walker Goal: Pt Will Transfer Bed To Chair/Chair To Bed Outcome: Progressing Flowsheets (Taken 03/08/2018 1058) Pt will Transfer Bed to Chair/Chair to Bed: with modified independence   10:59 AM, 03/08/18 Talbot Grumbling, PT, DPT Physical Therapist with [facility] [phone] mobile phone

## 2018-03-08 NOTE — Plan of Care (Signed)

## 2018-03-08 NOTE — Discharge Summary (Signed)
Physician Discharge Summary  Shari Vincent ZOX:[id number] DOB: 1931/12/28 DOA: 03/07/2018  PCP: Pleas Koch, NP  Admit date: 03/07/2018  Discharge date: 03/08/2018  Admitted From:Home  Disposition:  Home  Recommendations for Outpatient Follow-up:  1. Follow up with PCP in 1-2 weeks 2. Continue home medications as prior  Home Health: Yes with home health PT  Equipment/Devices: None, patient has rolling walker at home  Discharge Condition: Stable  CODE STATUS: DNR  Diet recommendation: Heart Healthy  Brief/Interim Summary: Per HPI: Shari Vincent is a 85 y.o. female with medical history significant of GERD, osteoarthritis of the right knee, bulging lumbar disc, basal cell skin cancer of right shoulder, colon polyps, diverticulosis, history of diverticulitis, hyperlipidemia, IBS, urolithiasis, sciatica, history of CVA, history of subarachnoid hemorrhage, trigeminal neuralgia, history of vertigo who was brought to the emergency department after having a fall at home sustaining a laceration on her right frontal area and losing consciousness briefly.  Per patient, she went to the bathroom and had intense abdominal pain.  She thinks that she got up and does not remember what happened after that.  The next thing she remembers is the EMS crew tending to her.  She denies chest pain, palpitations, dizziness, diaphoresis, PND, orthopnea.  She denies fever, chills, sore throat, dyspnea, wheezing, hemoptysis.  No nausea, emesis, melena or hematochezia.  She gets diarrhea or constipation depending on her IBS symptomatology.  She denies dysuria, frequency or hematuria.  She denies polyuria, polydipsia, polyphagia or blurred vision.  Patient was admitted with a scalp hematoma secondary to head trauma from syncopal episode.  She underwent carotid ultrasound as well as 2D echocardiogram with no significant findings.  Please see reports as noted below.  She has had no other significant events  during her hospitalization and has remained stable from a neurological standpoint and had her usual baseline per family members to include her husband and son at bedside.  She has ambulated with physical therapy with recommendations for home health physical therapy to help with balance and gait issues as well as some weakness.  She will resume her usual home medications as previously prescribed.  Discharge Diagnoses:  Principal Problem:   Syncope Active Problems:   Hyperlipidemia   GERD   VERTIGO   IBS (irritable bowel syndrome)   Trigeminal neuralgia   Osteoporosis   Leukocytosis   Concussion with brief loss of consciousness  Principal discharge diagnosis: Vasovagal syncope.  Discharge Instructions  Discharge Instructions    Diet - low sodium heart healthy   Complete by:  As directed    Increase activity slowly   Complete by:  As directed      Allergies as of 03/08/2018      Reactions   Albuterol Rash   Cefdinir Diarrhea   Lactose Intolerance (gi) Nausea And Vomiting, Other (See Comments)   Patient has IBS   Levofloxacin Nausea Only   Other Nausea And Vomiting   No greasy foods!!   Adhesive [tape] Rash   Paper tape is tolerated   Epinephrine Anxiety, Palpitations   Latex Rash      Medication List    TAKE these medications   BLUE-EMU SUPER STRENGTH Crea Apply 1 application topically 3 (three) times daily as needed (for knee and back pain).   calcium carbonate 1250 (500 Ca) MG chewable tablet Commonly known as:  OS-CAL Chew 1 tablet by mouth daily with supper.   D3 MAXIMUM STRENGTH 125 MCG (5000 UT) capsule Generic drug:  Cholecalciferol Take  5,000 Units by mouth daily with lunch.   DIGESTIVE ADVANTAGE GUMMIES Chew Chew 1 tablet by mouth daily.   gabapentin 100 MG capsule Commonly known as:  NEURONTIN TAKE 1 CAPSULE BY MOUTH ONCE DAILY AT BEDTIME What changed:    how much to take  how to take this  when to take this  additional instructions    hyoscyamine 0.125 MG tablet Commonly known as:  LEVSIN, ANASPAZ Take 0.125 mg by mouth every 4 (four) hours as needed (ibs/stomach pain).   Lacosamide 100 MG Tabs Commonly known as:  VIMPAT Take 1 tablet (100 mg total) by mouth 2 (two) times daily.   meclizine 25 MG tablet Commonly known as:  ANTIVERT Take 25 mg by mouth 3 (three) times daily as needed for dizziness (vertigo). Reported on 05/13/2015   OXcarbazepine 150 MG tablet Commonly known as:  TRILEPTAL Take 1 tablet (150 mg total) by mouth 2 (two) times daily.   pantoprazole 20 MG tablet Commonly known as:  PROTONIX TAKE 1 TABLET BY MOUTH TWICE DAILY   rosuvastatin 5 MG tablet Commonly known as:  CRESTOR Take on Monday, Wednesday, Friday at bedtime for cholesterol. What changed:    how much to take  how to take this  when to take this      Follow-up Information    Pleas Koch, NP. Schedule an appointment as soon as possible for a visit in 1 week(s).   Specialty:  Internal Medicine Contact information: [place] [address] [phone]          Allergies  Allergen Reactions  . Albuterol Rash  . Cefdinir Diarrhea  . Lactose Intolerance (Gi) Nausea And Vomiting and Other (See Comments)    Patient has IBS  . Levofloxacin Nausea Only  . Other Nausea And Vomiting    No greasy foods!!  . Adhesive [Tape] Rash    Paper tape is tolerated  . Epinephrine Anxiety and Palpitations  . Latex Rash    Consultations:  None   Procedures/Studies: Ct Head Wo Contrast  Result Date: 03/07/2018 CLINICAL DATA:  Syncopal episode resulting in a fall at home in the bathroom with a laceration to the middle of the forehead. EXAM: CT HEAD WITHOUT CONTRAST CT CERVICAL SPINE WITHOUT CONTRAST TECHNIQUE: Multidetector CT imaging of the head and cervical spine was performed following the standard protocol without intravenous contrast. Multiplanar CT image reconstructions of the cervical spine were also  generated. COMPARISON:  Brain MR dated 09/27/2017. Head and neck CTA dated 09/27/2017. Head CT dated 09/27/2017. FINDINGS: CT HEAD FINDINGS Brain: Moderately enlarged ventricles and subarachnoid spaces. Moderate patchy white matter low density in both cerebral hemispheres. No intracranial hemorrhage, mass lesion or CT evidence of acute infarction. Vascular: No hyperdense vessel or unexpected calcification. Skull: Normal. Negative for fracture or focal lesion. Sinuses/Orbits: Status post bilateral cataract extraction. Mild right maxillary sinus mucosal thickening. Other: Bilateral intravenous air at the skull base and posterior, superior left orbit, compatible with recent intravenous access. Small right frontal scalp hematoma and laceration with soft tissue air. CT CERVICAL SPINE FINDINGS Alignment: Normal. Skull base and vertebrae: No acute fracture. No primary bone lesion or focal pathologic process. Soft tissues and spinal canal: No prevertebral fluid or swelling. No visible canal hematoma. Disc levels:  Multilevel degenerative changes. Upper chest: Clear lung apices. Other: Bilateral intravenous air compatible with recent intravenous access. Bilateral carotid artery calcifications. IMPRESSION: 1. No skull fracture or intracranial hemorrhage. 2. No cervical spine fracture or subluxation. 3.  Moderate diffuse cerebral and cerebellar atrophy. 4. Moderate chronic small vessel white matter ischemic changes in both cerebral hemispheres. 5. Multilevel cervical spine degenerative changes. 6. Bilateral carotid artery atheromatous calcifications. Electronically Signed   By: Claudie Revering M.D.   On: 03/07/2018 22:01   Ct Cervical Spine Wo Contrast  Result Date: 03/07/2018 CLINICAL DATA:  Syncopal episode resulting in a fall at home in the bathroom with a laceration to the middle of the forehead. EXAM: CT HEAD WITHOUT CONTRAST CT CERVICAL SPINE WITHOUT CONTRAST TECHNIQUE: Multidetector CT imaging of the head and cervical  spine was performed following the standard protocol without intravenous contrast. Multiplanar CT image reconstructions of the cervical spine were also generated. COMPARISON:  Brain MR dated 09/27/2017. Head and neck CTA dated 09/27/2017. Head CT dated 09/27/2017. FINDINGS: CT HEAD FINDINGS Brain: Moderately enlarged ventricles and subarachnoid spaces. Moderate patchy white matter low density in both cerebral hemispheres. No intracranial hemorrhage, mass lesion or CT evidence of acute infarction. Vascular: No hyperdense vessel or unexpected calcification. Skull: Normal. Negative for fracture or focal lesion. Sinuses/Orbits: Status post bilateral cataract extraction. Mild right maxillary sinus mucosal thickening. Other: Bilateral intravenous air at the skull base and posterior, superior left orbit, compatible with recent intravenous access. Small right frontal scalp hematoma and laceration with soft tissue air. CT CERVICAL SPINE FINDINGS Alignment: Normal. Skull base and vertebrae: No acute fracture. No primary bone lesion or focal pathologic process. Soft tissues and spinal canal: No prevertebral fluid or swelling. No visible canal hematoma. Disc levels:  Multilevel degenerative changes. Upper chest: Clear lung apices. Other: Bilateral intravenous air compatible with recent intravenous access. Bilateral carotid artery calcifications. IMPRESSION: 1. No skull fracture or intracranial hemorrhage. 2. No cervical spine fracture or subluxation. 3. Moderate diffuse cerebral and cerebellar atrophy. 4. Moderate chronic small vessel white matter ischemic changes in both cerebral hemispheres. 5. Multilevel cervical spine degenerative changes. 6. Bilateral carotid artery atheromatous calcifications. Electronically Signed   By: Claudie Revering M.D.   On: 03/07/2018 22:01   US Carotid Bilateral  Result Date: 03/08/2018 CLINICAL DATA:  85 year old female with syncope and collapse EXAM: BILATERAL CAROTID DUPLEX ULTRASOUND  TECHNIQUE: Pearline Cables scale imaging, color Doppler and duplex ultrasound were performed of bilateral carotid and vertebral arteries in the neck. COMPARISON:  None. FINDINGS: Criteria: Quantification of carotid stenosis is based on velocity parameters that correlate the residual internal carotid diameter with NASCET-based stenosis levels, using the diameter of the distal internal carotid lumen as the denominator for stenosis measurement. The following velocity measurements were obtained: RIGHT ICA: 106/26 cm/sec CCA: 50/09 cm/sec SYSTOLIC ICA/CCA RATIO:  1.2 ECA:  111 cm/sec LEFT ICA: 72/18 cm/sec CCA: 38/18 cm/sec SYSTOLIC ICA/CCA RATIO:  0.8 ECA:  83 cm/sec RIGHT CAROTID ARTERY: Mild heterogeneous partially calcified atherosclerotic plaque in the proximal internal carotid artery. By peak systolic velocity criteria, the estimated stenosis remains less than 50%. RIGHT VERTEBRAL ARTERY:  Patent with normal antegrade flow. LEFT CAROTID ARTERY: Mild echogenic and likely calcified atherosclerotic plaque in the proximal internal carotid artery. By peak systolic velocity criteria, the estimated stenosis remains less than 50%. LEFT VERTEBRAL ARTERY:  Patent with normal antegrade flow. IMPRESSION: 1. Mild (1-49%) stenosis proximal right internal carotid artery secondary to mild focal echogenic/calcified atherosclerotic plaque. 2. Mild (1-49%) stenosis proximal left internal carotid artery secondary to mild focal echogenic/calcified atherosclerotic plaque. 3. Vertebral arteries are patent with normal antegrade flow. Signed, Criselda Peaches, MD, [facility] Electronically Signed   By: Myrle Sheng  Laurence Ferrari M.D.   On: 03/08/2018 11:40   Dg Chest Portable 1 View  Result Date: 03/07/2018 CLINICAL DATA:  Syncopal episode. EXAM: PORTABLE CHEST 1 VIEW COMPARISON:  09/27/2017 FINDINGS: 2030 hours. Low lung volumes. The cardio pericardial silhouette is enlarged. There is  pulmonary vascular congestion without overt pulmonary edema. Interstitial pulmonary edema suspected. No substantial pleural effusion. The visualized bony structures of the thorax are intact. Telemetry leads overlie the chest. IMPRESSION: Low volume film with cardiomegaly and vascular congestion. Likely associated interstitial edema. Electronically Signed   By: Misty Stanley M.D.   On: 03/07/2018 20:46     Discharge Exam: Vitals:   03/08/18 0808 03/08/18 1133  BP: 119/60 (!) 130/54  Pulse: 70 62  Resp: 20 20  Temp: 97.8 F (36.6 C) 98.1 F (36.7 C)  SpO2: 98% 97%   Vitals:   03/08/18 0341 03/08/18 0500 03/08/18 0808 03/08/18 1133  BP: 111/62  119/60 (!) 130/54  Pulse: 79  70 62  Resp: 16  20 20   Temp: 97.9 F (36.6 C)  97.8 F (36.6 C) 98.1 F (36.7 C)  TempSrc:   Oral   SpO2: 97%  98% 97%  Weight:  72.5 kg    Height:        General: Pt is alert, awake, not in acute distress Cardiovascular: RRR, S1/S2 +, no rubs, no gallops Respiratory: CTA bilaterally, no wheezing, no rhonchi Abdominal: Soft, NT, ND, bowel sounds + Extremities: no edema, no cyanosis Scalp with mild bruising and laceration-stable.    The results of significant diagnostics from this hospitalization (including imaging, microbiology, ancillary and laboratory) are listed below for reference.     Microbiology: Recent Results (from the past 240 hour(s))  MRSA PCR Screening     Status: None   Collection Time: 03/07/18 11:58 PM  Result Value Ref Range Status   MRSA by PCR NEGATIVE NEGATIVE Final    Comment:        The GeneXpert MRSA Assay (FDA approved for NASAL specimens only), is one component of a comprehensive MRSA colonization surveillance program. It is not intended to diagnose MRSA infection nor to guide or monitor treatment for MRSA infections. Performed at [facility], [address] [place], [place] [address]      Labs: BNP (last 3 results) Recent Labs    03/07/18 2054  BNP 19.4    Basic Metabolic Panel: Recent Labs  Lab 03/07/18 2054 03/07/18 2140  NA 138 139  K 3.9 4.7  CL 103 105  CO2 24  --   GLUCOSE 123* 115*  BUN 16 21  CREATININE 0.93 0.90  CALCIUM 9.1  --   MG 2.0  --   PHOS 3.7  --    Liver Function Tests: Recent Labs  Lab 03/07/18 2054  AST 19  ALT 11  ALKPHOS 58  BILITOT 0.3  PROT 7.4  ALBUMIN 4.3   No results for input(s): LIPASE, AMYLASE in the last 168 hours. No results for input(s): AMMONIA in the last 168 hours. CBC: Recent Labs  Lab 03/07/18 2054 03/07/18 2140 03/08/18 0925  WBC 13.3*  --  10.9*  NEUTROABS 10.9*  --  7.7  HGB 13.1 16.7* 11.9*  HCT 40.7 49.0* 37.0  MCV 93.8  --  93.9  PLT 156  --  144*   Cardiac Enzymes: Recent Labs  Lab 03/08/18 0309 03/08/18 0925  TROPONINI <0.03 <0.03   BNP: Invalid input(s): POCBNP CBG: Recent Labs  Lab 03/08/18 0522  GLUCAP 114*  D-Dimer No results for input(s): DDIMER in the last 72 hours. Hgb A1c No results for input(s): HGBA1C in the last 72 hours. Lipid Profile No results for input(s): CHOL, HDL, LDLCALC, TRIG, CHOLHDL, LDLDIRECT in the last 72 hours. Thyroid function studies No results for input(s): TSH, T4TOTAL, T3FREE, THYROIDAB in the last 72 hours.  Invalid input(s): FREET3 Anemia work up No results for input(s): VITAMINB12, FOLATE, FERRITIN, TIBC, IRON, RETICCTPCT in the last 72 hours. Urinalysis    Component Value Date/Time   COLORURINE YELLOW 03/07/2018 2032   APPEARANCEUR HAZY (A) 03/07/2018 2032   LABSPEC 1.008 03/07/2018 2032   PHURINE 7.0 03/07/2018 2032   GLUCOSEU NEGATIVE 03/07/2018 2032   HGBUR SMALL (A) 03/07/2018 2032   BILIRUBINUR NEGATIVE 03/07/2018 2032   BILIRUBINUR negative 10/04/2016 1427   KETONESUR NEGATIVE 03/07/2018 2032   PROTEINUR NEGATIVE 03/07/2018 2032   UROBILINOGEN 0.2 10/04/2016 1427   NITRITE NEGATIVE 03/07/2018 2032   LEUKOCYTESUR TRACE (A) 03/07/2018 2032   Sepsis Labs Invalid input(s): PROCALCITONIN,  WBC,   LACTICIDVEN Microbiology Recent Results (from the past 240 hour(s))  MRSA PCR Screening     Status: None   Collection Time: 03/07/18 11:58 PM  Result Value Ref Range Status   MRSA by PCR NEGATIVE NEGATIVE Final    Comment:        The GeneXpert MRSA Assay (FDA approved for NASAL specimens only), is one component of a comprehensive MRSA colonization surveillance program. It is not intended to diagnose MRSA infection nor to guide or monitor treatment for MRSA infections. Performed at [facility], [address], [place], [place] [address]      Time coordinating discharge: 35 minutes  SIGNED:   Rodena Goldmann, DO [facility] 03/08/2018, 2:29 PM Pager [phone]  If 7PM-7AM, please contact night-coverage [email or web address] Password [email or web address]

## 2018-03-08 NOTE — Progress Notes (Signed)
Patient having ECHO done. EKG postponed.

## 2018-03-08 NOTE — Care Management Note (Signed)
Case Management Note  Patient Details  Name: Shari Vincent MRN: [id number] Date of Birth: [birth year]  Subjective/Objective:  Syncope/fall. From home with husband. Has RW , cane and rollator walker. Has PCP. Seen by PT , recommended for [name] PT. She elects [name] as she has had them in the past.                    Action/Plan: DC home with home health and supervision from family. [name] of [name] notified and will obtain orders from chart when available.   Expected Discharge Date:     03/08/18             Expected Discharge Plan:  [place]  In-House Referral:     Discharge planning Services  CM Consult  Post Acute Care Choice:  Home Health Choice offered to:  Patient, Spouse, Adult Children  DME Arranged:    DME Agency:     HH Arranged:  PT [name]:  [name]  Status of Service:  Completed, signed off  If discussed at [place] of Stay Meetings, dates discussed:    Additional Comments:  [name], [name], RN 03/08/2018, 12:26 PM

## 2018-03-08 NOTE — Progress Notes (Signed)
Dr.Ortiz in room with patient and family member. AC Kathy pulled the policy on given medication and spoke with patient and patient family with Dr.Ortiz present. Explained to family member that medication will have to go to pharmacy to be labled and dispense. Dr. Olevia Bowens agreed to let family members dispense home medication to patient as ordered. Will continue to monitor throughout shift.

## 2018-03-08 NOTE — Progress Notes (Signed)
Patients family member is requesting that staff to not give patient medication that is scheduled. Per family they would like to give patient her home medication. Explained to family member that per policy that patients do not take their home medication while in the hospital. Paged MD to make aware. MD called and stated to tell the [facility] to speak with the family member. AC notified and will speak to patient and patients family member. Will continue to monitor throughout shift.

## 2018-03-08 NOTE — Plan of Care (Signed)
  Problem: Education: Goal: Knowledge of General Education information will improve Description Including pain rating scale, medication(s)/side effects and non-pharmacologic comfort measures 03/08/2018 1444 by Rance Muir, RN Outcome: Adequate for Discharge 03/08/2018 0832 by Rance Muir, RN Outcome: Progressing   Problem: Health Behavior/Discharge Planning: Goal: Ability to manage health-related needs will improve 03/08/2018 1444 by Rance Muir, RN Outcome: Adequate for Discharge 03/08/2018 0832 by Rance Muir, RN Outcome: Progressing   Problem: Clinical Measurements: Goal: Ability to maintain clinical measurements within normal limits will improve 03/08/2018 1444 by Rance Muir, RN Outcome: Adequate for Discharge 03/08/2018 0832 by Rance Muir, RN Outcome: Progressing Goal: Will remain free from infection 03/08/2018 1444 by Rance Muir, RN Outcome: Adequate for Discharge 03/08/2018 [phone] by Rance Muir, RN Outcome: Progressing Goal: Diagnostic test results will improve 03/08/2018 1444 by Rance Muir, RN Outcome: Adequate for Discharge 03/08/2018 0832 by Rance Muir, RN Outcome: Progressing Goal: Respiratory complications will improve 03/08/2018 1444 by Rance Muir, RN Outcome: Adequate for Discharge 03/08/2018 [phone] by Rance Muir, RN Outcome: Progressing Goal: Cardiovascular complication will be avoided 03/08/2018 1444 by Rance Muir, RN Outcome: Adequate for Discharge 03/08/2018 0832 by Rance Muir, RN Outcome: Progressing   Problem: Activity: Goal: Risk for activity intolerance will decrease 03/08/2018 1444 by Rance Muir, RN Outcome: Adequate for Discharge 03/08/2018 [phone] by Rance Muir, RN Outcome: Progressing   Problem: Nutrition: Goal: Adequate nutrition will be maintained 03/08/2018 1444 by Rance Muir, RN Outcome: Adequate for Discharge 03/08/2018 0832 by Rance Muir, RN Outcome: Progressing   Problem: Coping: Goal: Level of anxiety will decrease 03/08/2018 1444 by Rance Muir, RN Outcome: Adequate for  Discharge 03/08/2018 [phone] by Rance Muir, RN Outcome: Progressing   Problem: Elimination: Goal: Will not experience complications related to bowel motility 03/08/2018 1444 by Rance Muir, RN Outcome: Adequate for Discharge 03/08/2018 [phone] by Rance Muir, RN Outcome: Progressing Goal: Will not experience complications related to urinary retention 03/08/2018 1444 by Rance Muir, RN Outcome: Adequate for Discharge 03/08/2018 0832 by Rance Muir, RN Outcome: Progressing   Problem: Pain Managment: Goal: General experience of comfort will improve 03/08/2018 1444 by Rance Muir, RN Outcome: Adequate for Discharge 03/08/2018 0832 by Rance Muir, RN Outcome: Progressing   Problem: Safety: Goal: Ability to remain free from injury will improve 03/08/2018 1444 by Rance Muir, RN Outcome: Adequate for Discharge 03/08/2018 0832 by Rance Muir, RN Outcome: Progressing   Problem: Skin Integrity: Goal: Risk for impaired skin integrity will decrease 03/08/2018 1444 by Rance Muir, RN Outcome: Adequate for Discharge 03/08/2018 0832 by Rance Muir, RN Outcome: Progressing

## 2018-03-08 NOTE — Evaluation (Signed)
Physical Therapy Evaluation Patient Details Name: Shari Vincent MRN: [id number] DOB: 06/22/1931 Today's Date: 03/08/2018   History of Present Illness  Shari Vincent is a 85 y.o. female with medical history significant of GERD, osteoarthritis of the right knee, bulging lumbar disc, basal cell skin cancer of right shoulder, colon polyps, diverticulosis, history of diverticulitis, hyperlipidemia, IBS, urolithiasis, sciatica, history of CVA, history of subarachnoid hemorrhage, trigeminal neuralgia, history of vertigo who was brought to the emergency department after having a fall at home sustaining a laceration on her right frontal area and losing consciousness briefly.    Clinical Impression  Patient near baseline for functional mobility and gait, slightly limited secondary to generalized weakness and fatigue during activity. Patient presents supine in bed with daughter at bedside, pleasant, joking, and agreeable to therapy. Patient requires initial steadying with transfers due to dizziness upon standing resolving with time. Patient able to ambulate with RW 50 feet and around hospital room/bathroom with min guard. Patient with labored step progression and requires RW instead of SPC, which is the patient's normal assistive device due to increased unsteadiness and weakness. Patient left in chair with daughter and spouse in room and call light in lap. Patient will benefit from continued physical therapy in hospital and recommended venue below to increase strength, balance, endurance for safe ADLs and gait.     Follow Up Recommendations Home health PT;Supervision - Intermittent    Equipment Recommendations  None recommended by PT    Recommendations for Other Services       Precautions / Restrictions Precautions Precautions: Fall Restrictions Weight Bearing Restrictions: No      Mobility  Bed Mobility Overal bed mobility: Needs Assistance Bed Mobility: Supine to Sit     Supine to sit:  Supervision     General bed mobility comments: increased time, use of bed rail  Transfers Overall transfer level: Needs assistance Equipment used: Rolling walker (2 wheeled) Transfers: Sit to/from [facility] Sit to Stand: Min guard Stand pivot transfers: Min guard       General transfer comment: increased time, dizziness upon standing resolves with time  Ambulation/Gait Ambulation/Gait assistance: Min guard Gait Distance (Feet): 50 Feet Assistive device: Rolling walker (2 wheeled) Gait Pattern/deviations: Decreased step length - right;Decreased step length - left;Decreased stride length Gait velocity: decreased   General Gait Details: slow step progression with increased double limb stance time, increased time with turns  Financial trader Rankin (Stroke Patients Only)       Balance Overall balance assessment: Needs assistance Sitting-balance support: Feet supported;No upper extremity supported Sitting balance-Leahy Scale: Good     Standing balance support: During functional activity;Bilateral upper extremity supported Standing balance-Leahy Scale: Fair Standing balance comment: with RW                             Pertinent Vitals/Pain Pain Assessment: No/denies pain    Home Living Family/patient expects to be discharged to:: Private residence Living Arrangements: Spouse/significant other Available Help at Discharge: Family;Available 24 hours/day(2 sons live within a mile and check on patient daily physically or via phone; children assist with transportation to doctor's appointments and grocery shopping) Type of Home: House Home Access: Level entry     Home Layout: Two level;Laundry or work area in basement(laundry in basement) Home Equipment: Environmental consultant - 2 wheels;Walker - 4 wheels;Cane - single point  Prior Function Level of Independence: Independent with assistive device(s)          Comments: community and household ambulator with SPC, does not drive     Hand Dominance   Dominant Hand: Right    Extremity/Trunk Assessment   Upper Extremity Assessment Upper Extremity Assessment: Overall WFL for tasks assessed    Lower Extremity Assessment Lower Extremity Assessment: Generalized weakness    Cervical / Trunk Assessment Cervical / Trunk Assessment: Normal  Communication   Communication: No difficulties  Cognition Arousal/Alertness: Awake/alert Behavior During Therapy: WFL for tasks assessed/performed Overall Cognitive Status: Within Functional Limits for tasks assessed                                        General Comments      Exercises     Assessment/Plan    PT Assessment Patient needs continued PT services  PT Problem List Decreased strength;Decreased activity tolerance;Decreased balance;Decreased mobility       PT Treatment Interventions Gait training;Functional mobility training;Therapeutic activities;Therapeutic exercise;Patient/family education    PT Goals (Current goals can be found in the Care Plan section)  Acute Rehab PT Goals Patient Stated Goal: home with spouse and family support PT Goal Formulation: With patient/family Time For Goal Achievement: 03/15/18 Potential to Achieve Goals: Good    Frequency Min 2X/week   Barriers to discharge        Co-evaluation               AM-PAC PT "6 Clicks" Mobility  Outcome Measure Help needed turning from your back to your side while in a flat bed without using bedrails?: A Little Help needed moving from lying on your back to sitting on the side of a flat bed without using bedrails?: A Little Help needed moving to and from a bed to a chair (including a wheelchair)?: A Little Help needed standing up from a chair using your arms (e.g., wheelchair or bedside chair)?: A Little Help needed to walk in hospital room?: A Little Help needed climbing 3-5 steps with a  railing? : A Lot 6 Click Score: 17    End of Session   Activity Tolerance: Patient tolerated treatment well;Patient limited by fatigue Patient left: in chair;with call bell/phone within reach;with family/visitor present Nurse Communication: Mobility status PT Visit Diagnosis: Unsteadiness on feet (R26.81);Other abnormalities of gait and mobility (R26.89);Muscle weakness (generalized) (M62.81)    Time: 5427-0623 PT Time Calculation (min) (ACUTE ONLY): 28 min   Charges:   PT Evaluation $PT Eval Moderate Complexity: 1 Mod PT Treatments $Gait Training: 8-22 mins        10:54 AM, 03/08/18 Talbot Grumbling, PT, DPT Physical Therapist with [facility] [phone] mobile phone

## 2018-03-08 NOTE — Care Management Obs Status (Signed)
[place] NOTIFICATION   Patient Details  Name: LARYSA PALL MRN: [id number] Date of Birth: 12/19/1931   Medicare Observation Status Notification Given:  Yes    Shelda Altes 03/08/2018, 12:36 PM

## 2018-03-08 NOTE — Progress Notes (Signed)
*  PRELIMINARY RESULTS* Echocardiogram 2D Echocardiogram has been performed.  Shari Vincent 03/08/2018, 9:54 AM

## 2018-03-08 NOTE — Discharge Instructions (Signed)
Syncope Syncope is when you pass out (faint) for a short time. It is caused by a sudden decrease in blood flow to the brain. Signs that you may be about to pass out include:  Feeling dizzy or light-headed.  Feeling sick to your stomach (nauseous).  Seeing all white or all black.  Having cold, clammy skin. If you pass out, get help right away. Call your local emergency services (911 in the [place] Do not drive yourself to the hospital. Follow these instructions at home: Watch for any changes in your symptoms. Take these actions to stay safe and help with your symptoms: Lifestyle  Do not drive, use machinery, or play sports until your doctor says it is okay.  Do not drink alcohol.  Do not use any products that contain nicotine or tobacco, such as cigarettes and e-cigarettes. If you need help quitting, ask your doctor.  Drink enough fluid to keep your pee (urine) pale yellow. General instructions  Take over-the-counter and prescription medicines only as told by your doctor.  If you are taking blood pressure or heart medicine, sit up and stand up slowly. Spend a few minutes getting ready to sit and then stand. This can help you feel less dizzy.  Have someone stay with you until you feel stable.  If you start to feel like you might pass out, lie down right away and raise (elevate) your feet above the level of your heart. Breathe deeply and steadily. Wait until all of the symptoms are gone.  Keep all follow-up visits as told by your doctor. This is important. Get help right away if:  You have a very bad headache.  You pass out once or more than once.  You have pain in your chest, belly, or back.  You have a very fast or uneven heartbeat (palpitations).  It hurts to breathe.  You are bleeding from your mouth or your bottom (rectum).  You have black or tarry poop (stool).  You have jerky movements that you cannot control (seizure).  You are confused.  You have trouble  walking.  You are very weak.  You have vision problems. These symptoms may be an emergency. Do not wait to see if the symptoms will go away. Get medical help right away. Call your local emergency services (911 in the [place] Do not drive yourself to the hospital. Summary  Syncope is when you pass out (faint) for a short time. It is caused by a sudden decrease in blood flow to the brain.  Signs that you may be about to faint include feeling dizzy, light-headed, or sick to your stomach, seeing all white or all black, or having cold, clammy skin.  If you start to feel like you might pass out, lie down right away and raise (elevate) your feet above the level of your heart. Breathe deeply and steadily. Wait until all of the symptoms are gone. This information is not intended to replace advice given to you by your health care provider. Make sure you discuss any questions you have with your health care provider. Document Released: 07/26/2007 Document Revised: 03/21/2017 Document Reviewed: 03/21/2017 Elsevier Interactive Patient Education  2019 Elsevier Inc.  

## 2018-03-11 ENCOUNTER — Telehealth: Payer: Self-pay | Admitting: *Deleted

## 2018-03-11 NOTE — Telephone Encounter (Signed)
Spoke to patient's son Archana Eckman (on [place] and has POA). Was advised by Dominica Severin that patient is a little confused and he can answer questions for his mom because family is staying with her around the clock. Transition Care Management Follow-up Telephone Call  Date discharged?03/08/18  How have you been since you were released from the hospital? Dominica Severin stated that his mom is doing better. Dominica Severin stated that she has stomach problems/IBS and she has the tendency to pass out when she has IBS problems.   Do you understand why you were in the hospital? YES per Dominica Severin   Do you understand the discharge instructions? YES per Dominica Severin and family is helping  Where were you discharged to? HOME per Dominica Severin  Items Reviewed:   Medications reviewed: YES  Allergies reviewed: YES  Dietary changes reviewed: YES  Referrals reviewed: YES   Functional Questionnaire:   Activities of Daily Living (ADLs):   She states they are independent in the following: Dominica Severin states that she is independent in  Grooming, dressing, ambulation, bathing and going to the bathroom  States they require assistance with the following: Dominica Severin stated that his sister helped her with a bath and washing her hair after she got home from the hospital because she had blood in her hair and had a hard time getting it out.  Any transportation issues/concerns?: No    Any patient concerns? NO   Confirmed importance and date/time of follow-up visits scheduled YES   Provider Appointment booked with [name] 03/18/18 at 3:40 Confirmed with patient if condition begins to worsen call PCP or go to the ER.  Patient was given the office number and encouraged to call back with question or concerns.  : Confirmed with Dominica Severin and telephone number given

## 2018-03-11 NOTE — Telephone Encounter (Signed)
Approved.  

## 2018-03-11 NOTE — Telephone Encounter (Signed)
Spoke to Prentiss, PT with [facility] who is requesting Christmas orders for pt to have PT 2x/wk for 2wks and 1x/wk for 2wks. pls advise

## 2018-03-11 NOTE — Telephone Encounter (Signed)
Noted  

## 2018-03-12 ENCOUNTER — Ambulatory Visit: Payer: Medicare Other | Admitting: Internal Medicine

## 2018-03-12 NOTE — Telephone Encounter (Signed)
Gave the approval for the verbal orders 

## 2018-03-12 NOTE — Telephone Encounter (Signed)
Message left for Dena to return my call.

## 2018-03-18 ENCOUNTER — Ambulatory Visit: Payer: Medicare Other | Admitting: Primary Care

## 2018-03-18 ENCOUNTER — Encounter: Payer: Self-pay | Admitting: Primary Care

## 2018-03-18 VITALS — BP 126/70 | HR 84 | Temp 98.2°F | Ht 59.0 in | Wt 153.8 lb

## 2018-03-18 DIAGNOSIS — M5430 Sciatica, unspecified side: Secondary | ICD-10-CM | POA: Diagnosis not present

## 2018-03-18 DIAGNOSIS — Z09 Encounter for follow-up examination after completed treatment for conditions other than malignant neoplasm: Secondary | ICD-10-CM

## 2018-03-18 DIAGNOSIS — R42 Dizziness and giddiness: Secondary | ICD-10-CM

## 2018-03-18 DIAGNOSIS — M5441 Lumbago with sciatica, right side: Secondary | ICD-10-CM

## 2018-03-18 DIAGNOSIS — E785 Hyperlipidemia, unspecified: Secondary | ICD-10-CM | POA: Diagnosis not present

## 2018-03-18 DIAGNOSIS — R55 Syncope and collapse: Secondary | ICD-10-CM

## 2018-03-18 DIAGNOSIS — M5431 Sciatica, right side: Secondary | ICD-10-CM

## 2018-03-18 DIAGNOSIS — G8929 Other chronic pain: Secondary | ICD-10-CM

## 2018-03-18 MED ORDER — ROSUVASTATIN CALCIUM 5 MG PO TABS: ORAL_TABLET | ORAL | 3 refills | Status: DC

## 2018-03-18 MED ORDER — MECLIZINE HCL 25 MG PO TABS: 25.0000 mg | ORAL_TABLET | Freq: Three times a day (TID) | ORAL | 0 refills | Status: DC | PRN

## 2018-03-18 MED ORDER — GABAPENTIN 100 MG PO CAPS: ORAL_CAPSULE | ORAL | 3 refills | Status: DC

## 2018-03-18 NOTE — Progress Notes (Signed)
Subjective:    Patient ID: Shari Vincent, female    DOB: Jun 10, 1931, 85 y.o.   MRN: [id number]  HPI  Ms. Shari Vincent is an 85 year old female who presents today for [facility] Follow up.  She presented to [facility] on 03/07/2018 with a chief complaint of syncope. She was in the bathroom at home due to abdominal pain, used the toilet, and was found by her son on the floor. She was unsure of what happened.   During her stay in the ED her labs showed elevated lactic acid, abnormal urine color with trace leuks. Troponin x 2 was negative, WBC of 13. She was treated with IV normal saline. CT of her head and cervical spine without acute abnormality. She sustained a scalp hematoma from her fall. She was admitted for further evaluation.  During her hospital stay she underwent carotid ultrasound and 2D echocardiogram which were both without acute findings. Her hospital stay was overall unremarkable with normal neuro exams. She did well with hospital PT. She was discharged home on 03/08/18 with recommendations for home PT and PCP follow up.  Since her hospital stay she's doing better. She had headaches a few times after discharge which resolved with Tylenol. She denies dizziness with ambulation. She is involved with home health PT and has had two visits thus far.  Her daughter is with her today who denies confusion.  She is ambulating with her cane at home.  Review of Systems  Respiratory: Negative for shortness of breath.   Cardiovascular: Negative for chest pain.  Neurological: Negative for dizziness, syncope and headaches.       Past Medical History:  Diagnosis Date  . Acid reflux   . Arthritis    right knee  . Bulging lumbar disc   . Cancer ([place])    Basal Cell on right shoulder  . Colon polyps   . Diverticulitis   . High cholesterol   . Irritable bowel syndrome (IBS)   . Kidney stone   . Knee pain    right  . Sciatic pain   . Stroke ([place]) 09/2017  . Subarachnoid hemorrhage ([place])   .  Trigeminal neuralgia   . Trigeminal neuralgia of right side of face   . Trigeminal neuralgia syndrome   . Vertigo      Social History   Socioeconomic History  . Marital status: Married    Spouse name: Not on file  . Number of children: 3  . Years of education: 56  . Highest education level: Not on file  Occupational History  . Not on file  Social Needs  . Financial resource strain: Not on file  . Food insecurity:    Worry: Not on file    Inability: Not on file  . Transportation needs:    Medical: Not on file    Non-medical: Not on file  Tobacco Use  . Smoking status: Former Smoker    Types: Cigarettes  . Smokeless tobacco: Never Used  Substance and Sexual Activity  . Alcohol use: No  . Drug use: No  . Sexual activity: Not Currently    Birth control/protection: Post-menopausal  Lifestyle  . Physical activity:    Days per week: Not on file    Minutes per session: Not on file  . Stress: Not on file  Relationships  . Social connections:    Talks on phone: Not on file    Gets together: Not on file    Attends religious service: Not on  file    Active member of club or organization: Not on file    Attends meetings of clubs or organizations: Not on file    Relationship status: Not on file  . Intimate partner violence:    Fear of current or ex partner: Not on file    Emotionally abused: Not on file    Physically abused: Not on file    Forced sexual activity: Not on file  Other Topics Concern  . Not on file  Social History Narrative   Married 62 years.   Has three children, six grandchildren, 3 great grandchildren and 2 on the way   Enjoys going to church, baking, shop.   Caffeine: occasional tea when eating out   Right-handed    Past Surgical History:  Procedure Laterality Date  . BREAST BIOPSY Left   . BREAST SURGERY     biopsy in 1990   . CATARACT EXTRACTION    . COLONOSCOPY  05/27/2008   [id number] rectal polyp,s/p bx/sigmoid diverticula. hyperplastic  polyps. benign random bx  . ESOPHAGOGASTRODUODENOSCOPY  05/27/2008   JXB:[id number]/FAOZH HH  . EYE SURGERY    . FOOT SURGERY     Left foot, metal plate  . HYSTEROSCOPY W/D&C N/A 09/03/2014   Procedure: DILATATION AND CURETTAGE /HYSTEROSCOPY;  Surgeon: Donnamae Jude, MD;  Location: [facility];  Service: Gynecology;  Laterality: N/A;  Requesting 09/03/14 @ 2:00p  . HYSTEROSCOPY W/D&C N/A 08/19/2015   Procedure: DILATATION AND CURETTAGE /HYSTEROSCOPY;  Surgeon: Donnamae Jude, MD;  Location: [facility];  Service: Gynecology;  Laterality: N/A;  . LITHOTRIPSY    . OTHER SURGICAL HISTORY     Uterine Prolapse/Tacking  . PROLAPSED UTERINE FIBROID LIGATION     in 1960  . TOOTH EXTRACTION  09/2017    Family History  Problem Relation Age of Onset  . Diabetes Mother   . Heart disease Mother        MI  . Uterine cancer Mother   . Stroke Father   . Cancer Paternal Grandmother        intestinal  . Stomach cancer Maternal Grandmother   . Colon cancer Neg Hx   . Breast cancer Neg Hx     Allergies  Allergen Reactions  . Albuterol Rash  . Cefdinir Diarrhea  . Lactose Intolerance (Gi) Nausea And Vomiting and Other (See Comments)    Patient has IBS  . Levofloxacin Nausea Only  . Other Nausea And Vomiting    No greasy foods!!  . Adhesive [Tape] Rash    Paper tape is tolerated  . Epinephrine Anxiety and Palpitations  . Latex Rash    Current Outpatient Medications on File Prior to Visit  Medication Sig Dispense Refill  . calcium carbonate (OS-CAL) 1250 (500 Ca) MG chewable tablet Chew 1 tablet by mouth daily with supper.    . Cholecalciferol (D3 MAXIMUM STRENGTH) 5000 units capsule Take 5,000 Units by mouth daily with lunch.     . hyoscyamine (LEVSIN, ANASPAZ) 0.125 MG tablet Take 0.125 mg by mouth every 4 (four) hours as needed (ibs/stomach pain).     . Lacosamide (VIMPAT) 100 MG TABS Take 1 tablet (100 mg total) by mouth 2 (two) times daily. 180 tablet 4  . Liniments (BLUE-EMU SUPER STRENGTH) CREA  Apply 1 application topically 3 (three) times daily as needed (for knee and back pain).     . OXcarbazepine (TRILEPTAL) 150 MG tablet Take 1 tablet (150 mg total) by mouth 2 (two) times daily. 180 tablet 4  .  pantoprazole (PROTONIX) 20 MG tablet TAKE 1 TABLET BY MOUTH TWICE DAILY (Patient taking differently: Take 20 mg by mouth 2 (two) times daily. ) 180 tablet 2  . Probiotic Product (DIGESTIVE ADVANTAGE GUMMIES) CHEW Chew 1 tablet by mouth daily.     No current facility-administered medications on file prior to visit.     BP 126/70   Pulse 84   Temp 98.2 F (36.8 C) (Oral)   Ht 4\' 11"  (1.499 m)   Wt 153 lb 12 oz (69.7 kg)   SpO2 96%   BMI 31.05 kg/m    Objective:   Physical Exam  Constitutional: She is oriented to person, place, and time. She appears well-nourished.  Eyes: EOM are normal.  Neck: Neck supple.  Cardiovascular: Normal rate and regular rhythm.  Respiratory: Effort normal and breath sounds normal.  Neurological: She is alert and oriented to person, place, and time. No cranial nerve deficit.  Skin: Skin is warm and dry.  Nearly healed bruising to bilateral face distal to eyes, right frontal forehead.  Mild residual hematoma.  Psychiatric: She has a normal mood and affect.           Assessment & Plan:

## 2018-03-18 NOTE — Assessment & Plan Note (Signed)
Recent admission for syncope, unsure of cause. Work-up in the hospital overall unremarkable. Patient doing well post hospital admission, participating in home physical therapy. Neuro exam today unremarkable, ambulates well with cane. Continue home health PT. Return precautions provided.  Hospital notes, labs, imaging reviewed.

## 2018-03-18 NOTE — Patient Instructions (Signed)
I sent refills of your medications to your pharmacy.  Continue working with physical therapy.   Please don't hesitate to message or call me with any questions or needs.  It was a pleasure to see you today!

## 2018-03-18 NOTE — Assessment & Plan Note (Signed)
Refill for meclizine sent to pharmacy.  Last prescription out of date.

## 2018-03-18 NOTE — Assessment & Plan Note (Signed)
Doing well with gabapentin at bedtime, continue same.  Refill sent to pharmacy.

## 2018-03-18 NOTE — Assessment & Plan Note (Signed)
Refills provided for rosuvastatin.

## 2018-03-19 ENCOUNTER — Other Ambulatory Visit: Payer: Self-pay

## 2018-03-19 ENCOUNTER — Ambulatory Visit: Payer: Medicare Other | Admitting: Internal Medicine

## 2018-03-19 ENCOUNTER — Encounter: Payer: Self-pay | Admitting: Internal Medicine

## 2018-03-19 VITALS — BP 134/73 | HR 63 | Temp 98.2°F | Ht 59.0 in | Wt 153.8 lb

## 2018-03-19 DIAGNOSIS — K580 Irritable bowel syndrome with diarrhea: Secondary | ICD-10-CM

## 2018-03-19 DIAGNOSIS — K219 Gastro-esophageal reflux disease without esophagitis: Secondary | ICD-10-CM

## 2018-03-19 MED ORDER — PANTOPRAZOLE SODIUM 20 MG PO TBEC: 20.0000 mg | DELAYED_RELEASE_TABLET | Freq: Two times a day (BID) | ORAL | 3 refills | Status: DC

## 2018-03-19 MED ORDER — HYOSCYAMINE SULFATE ER 0.375 MG PO TB12: 0.3750 mg | ORAL_TABLET | Freq: Every day | ORAL | 11 refills | Status: DC

## 2018-03-19 NOTE — Patient Instructions (Signed)
Continue Protonix 20 mg twice daily (180 - with 3 refills)  Stop Levsin  Begin Lev-BID one tablet each morning (disp 30 with 11 refills)  Watch for side effects as discussed  Office visit in 3 months

## 2018-03-19 NOTE — Progress Notes (Signed)
Primary Care Physician:  Pleas Koch, NP Primary Gastroenterologist:  Dr. Gala Romney  Pre-Procedure History & Physical: HPI:  Shari Vincent is a 85 y.o. lady here for follow-up of GERD and irritable bowel syndrome.  Reflux symptoms well controlled on Protonix 20 mg twice daily.  Has been on Levsin sublingual-takes on occasion when she develops abdominal cramps and loose stools.  Has fairly normal bowel function most of the time in fact, firm stools which she takes a stool softener.  When she gets an attack, she gets vasovagal's symptoms and actually has passed out in the bathroom on more than one occasion.  Syncope only associated with stooling episodes.  Denies palpitations or syncope or other symptoms at any other time.  Past Medical History:  Diagnosis Date  . Acid reflux   . Acute cerebrovascul insuff, transient focal neurologic signs/symptoms 06/27/2016  . Arthritis    right knee  . Bulging lumbar disc   . Cancer ([place])    Basal Cell on right shoulder  . Colon polyps   . Diverticulitis   . High cholesterol   . Irritable bowel syndrome (IBS)   . Kidney stone   . Knee pain    right  . Sciatic pain   . Stroke ([place]) 09/2017  . Subarachnoid hemorrhage ([place])   . Trigeminal neuralgia   . Trigeminal neuralgia of right side of face   . Trigeminal neuralgia syndrome   . Vertigo     Past Surgical History:  Procedure Laterality Date  . BREAST BIOPSY Left   . BREAST SURGERY     biopsy in 1990   . CATARACT EXTRACTION    . COLONOSCOPY  05/27/2008   GTX:MIWOEHOZYY rectal polyp,s/p bx/sigmoid diverticula. hyperplastic polyps. benign random bx  . ESOPHAGOGASTRODUODENOSCOPY  05/27/2008   Q[facility]:NOIBBC/WUGQB HH  . EYE SURGERY    . FOOT SURGERY     Left foot, metal plate  . HYSTEROSCOPY W/D&C N/A 09/03/2014   Procedure: DILATATION AND CURETTAGE /HYSTEROSCOPY;  Surgeon: Donnamae Jude, MD;  Location: [facility];  Service: Gynecology;  Laterality: N/A;  Requesting 09/03/14 @ 2:00p  .  HYSTEROSCOPY W/D&C N/A 08/19/2015   Procedure: DILATATION AND CURETTAGE /HYSTEROSCOPY;  Surgeon: Donnamae Jude, MD;  Location: [facility];  Service: Gynecology;  Laterality: N/A;  . LITHOTRIPSY    . OTHER SURGICAL HISTORY     Uterine Prolapse/Tacking  . PROLAPSED UTERINE FIBROID LIGATION     in 1960  . TOOTH EXTRACTION  09/2017    Prior to Admission medications   Medication Sig Start Date End Date Taking? Authorizing Provider  calcium carbonate (OS-CAL) 1250 (500 Ca) MG chewable tablet Chew 1 tablet by mouth daily with supper.   Yes [provider]  Cholecalciferol (D3 MAXIMUM STRENGTH) 5000 units capsule Take 5,000 Units by mouth daily with lunch.    Yes [provider]  gabapentin (NEURONTIN) 100 MG capsule TAKE 1 CAPSULE BY MOUTH ONCE DAILY AT BEDTIME 03/18/18  Yes Pleas Koch, NP  hyoscyamine (LEVSIN, ANASPAZ) 0.125 MG tablet Take 0.125 mg by mouth every 4 (four) hours as needed (ibs/stomach pain).    Yes [provider]  Lacosamide (VIMPAT) 100 MG TABS Take 1 tablet (100 mg total) by mouth 2 (two) times daily. 10/30/17  Yes Melvenia Beam, MD  Liniments (BLUE-EMU SUPER STRENGTH) CREA Apply 1 application topically 3 (three) times daily as needed (for knee and back pain).    Yes [provider]  meclizine (ANTIVERT) 25 MG  tablet Take 1 tablet (25 mg total) by mouth 3 (three) times daily as needed for dizziness (vertigo). Reported on 05/13/2015 03/18/18  Yes Pleas Koch, NP  OXcarbazepine (TRILEPTAL) 150 MG tablet Take 1 tablet (150 mg total) by mouth 2 (two) times daily. 10/30/17  Yes Melvenia Beam, MD  pantoprazole (PROTONIX) 20 MG tablet TAKE 1 TABLET BY MOUTH TWICE DAILY Patient taking differently: Take 20 mg by mouth 2 (two) times daily.  08/22/17  Yes Annitta Needs, NP  Probiotic Product (DIGESTIVE ADVANTAGE GUMMIES) CHEW Chew 1 tablet by mouth daily.   Yes [provider]  rosuvastatin (CRESTOR) 5 MG tablet Take on Monday, Wednesday,  Friday at bedtime for cholesterol. 03/18/18  Yes Pleas Koch, NP    Allergies as of 03/19/2018 - Review Complete 03/19/2018  Allergen Reaction Noted  . Albuterol Rash 05/24/2014  . Cefdinir Diarrhea 07/23/2012  . Lactose intolerance (gi) Nausea And Vomiting and Other (See Comments) 09/27/2017  . Levofloxacin Nausea Only 10/22/2014  . Other Nausea And Vomiting 09/27/2017  . Adhesive [tape] Rash 09/27/2017  . Epinephrine Anxiety and Palpitations 06/23/2016  . Latex Rash 08/09/2015    Family History  Problem Relation Age of Onset  . Diabetes Mother   . Heart disease Mother        MI  . Uterine cancer Mother   . Stroke Father   . Cancer Paternal Grandmother        intestinal  . Stomach cancer Maternal Grandmother   . Colon cancer Neg Hx   . Breast cancer Neg Hx     Social History   Socioeconomic History  . Marital status: Married    Spouse name: Not on file  . Number of children: 3  . Years of education: 70  . Highest education level: Not on file  Occupational History  . Not on file  Social Needs  . Financial resource strain: Not on file  . Food insecurity:    Worry: Not on file    Inability: Not on file  . Transportation needs:    Medical: Not on file    Non-medical: Not on file  Tobacco Use  . Smoking status: Former Smoker    Types: Cigarettes  . Smokeless tobacco: Never Used  Substance and Sexual Activity  . Alcohol use: No  . Drug use: No  . Sexual activity: Not Currently    Birth control/protection: Post-menopausal  Lifestyle  . Physical activity:    Days per week: Not on file    Minutes per session: Not on file  . Stress: Not on file  Relationships  . Social connections:    Talks on phone: Not on file    Gets together: Not on file    Attends religious service: Not on file    Active member of club or organization: Not on file    Attends meetings of clubs or organizations: Not on file    Relationship status: Not on file  . Intimate partner  violence:    Fear of current or ex partner: Not on file    Emotionally abused: Not on file    Physically abused: Not on file    Forced sexual activity: Not on file  Other Topics Concern  . Not on file  Social History Narrative   Married 62 years.   Has three children, six grandchildren, 3 great grandchildren and 2 on the way   Enjoys going to church, baking, shop.   Caffeine: occasional tea when eating  out   Right-handed    Review of Systems: See HPI, otherwise negative ROS  Physical Exam: BP 134/73   Pulse 63   Temp 98.2 F (36.8 C) (Oral)   Ht 4\' 11"  (1.499 m)   Wt 153 lb 12.8 oz (69.8 kg)   BMI 31.06 kg/m  General: Frail, elderly lady  accompanied by daughter. Neck:  Supple; no masses or thyromegaly. No significant cervical adenopathy. Lungs:  Clear throughout to auscultation.   No wheezes, crackles, or rhonchi. No acute distress. Heart:  Regular rate and rhythm; no murmurs, clicks, rubs,  or gallops. Abdomen: Non-distended, normal bowel sounds.  Soft and nontender without appreciable mass or hepatosplenomegaly.   Impression/Plan:   GERD-well-controlled on Protonix 20 mg orally twice daily.  Infrequent episodes of postprandial abdominal cramps and diarrhea most consistent with irritable bowel syndrome.  The challenges she has sometimes a very significant vasovagal symptoms to the point of syncope.  Once she suffers an attack, it is too late for Levsin sublingual to abort symptoms. Given prior history of minor trauma with falls related to syncope in the bathroom, I feel we ought to start her on daily prophylactic therapy in the in the way of low-dose Levbid;  I discussed potential side effects of chronic Levbid therapy with patient and her son.   Recommendations:    Continue Protonix 20 mg twice daily (180 - with 3 refills)  Stop Levsin  Begin Lev-BID one tablet each morning (disp 30 with 11 refills)  Watch for side effects as discussed  Office visit in 3  months   Notice: This dictation was prepared with Dragon dictation along with smaller phrase technology. Any transcriptional errors that result from this process are unintentional and may not be corrected upon review.

## 2018-03-20 ENCOUNTER — Telehealth: Payer: Self-pay

## 2018-03-20 NOTE — Telephone Encounter (Signed)
Noted  

## 2018-03-20 NOTE — Telephone Encounter (Signed)
Shari Vincent physical therapist with [name] called to report that patient missed her physical therapy appointment today as she was not in the home when they arrived.  They also had to reschedule her appointment from Monday to tomorrow due to multiple physician appointments on that day.  Just an FYI.

## 2018-03-25 ENCOUNTER — Telehealth: Payer: Self-pay

## 2018-03-25 NOTE — Telephone Encounter (Signed)
Shari Vincent's pharmacy needs a clarification for Hyoscyamine 0.375 mg. Pt thought that her medication is suppose to be Bentayl instead of Lev-Bid that was sent in for pt. Please advise.

## 2018-03-25 NOTE — Telephone Encounter (Signed)
Never discussed Bentyl  during her recent office visit.  Wanted long-acting Levsin preparation which is a LEV-BID - Lev-bid is what I prescribed and intended for her to take

## 2018-03-25 NOTE — Telephone Encounter (Signed)
Noted. Pharmacy was notified to leave medication the way is was written.

## 2018-03-26 ENCOUNTER — Telehealth: Payer: Self-pay

## 2018-03-26 NOTE — Telephone Encounter (Signed)
PT's son, Dominica Severin, called with concerns about the Levbid. He was still not understanding. He thought he was getting same med as before (Levsin). I called pharmacist Neita Goodnight and they had cancelled out the Benjamin Perez because son had called and said they did not want that. I told him that is what Dr. Gala Romney ordered the [place] long acting once a day. They are reactivating the prescription. I called and explained to son. He expresses understanding now. He will call if questions. He apologized for his misunderstanding.

## 2018-03-26 NOTE — Telephone Encounter (Signed)
Noted  

## 2018-03-26 NOTE — Telephone Encounter (Signed)
All appears correct at this time.

## 2018-05-07 ENCOUNTER — Other Ambulatory Visit: Payer: Self-pay | Admitting: Neurology

## 2018-06-05 ENCOUNTER — Ambulatory Visit: Payer: Medicare Other | Admitting: Neurology

## 2018-06-11 ENCOUNTER — Ambulatory Visit: Payer: Medicare Other | Admitting: Internal Medicine

## 2018-07-23 ENCOUNTER — Ambulatory Visit: Payer: Self-pay | Admitting: Internal Medicine

## 2018-07-23 ENCOUNTER — Other Ambulatory Visit: Payer: Self-pay | Admitting: *Deleted

## 2018-07-23 ENCOUNTER — Telehealth: Payer: Self-pay | Admitting: Neurology

## 2018-07-23 MED ORDER — LACOSAMIDE 100 MG PO TABS: 1.0000 | ORAL_TABLET | Freq: Two times a day (BID) | ORAL | 0 refills | Status: DC

## 2018-07-23 NOTE — Telephone Encounter (Signed)
Noted thanks °

## 2018-07-23 NOTE — Telephone Encounter (Signed)
Pt's daughter requested refill of Vimpat. Pending appt June 2020.

## 2018-07-23 NOTE — Telephone Encounter (Signed)
Pt daughter is asking for the refill of VIMPAT 100 MG TABS be sent to [facility] in [place], [place]

## 2018-07-23 NOTE — Telephone Encounter (Signed)
Refill request sent to Dr. Jaynee Eagles.

## 2018-07-23 NOTE — Telephone Encounter (Addendum)
07-23-2018 Pt daughter has called to give verbal consent to file insurance for [email or web address] vv Email confirmed as:[email or web address]  Pt understands that although there may be some limitations with this type of visit, we will take all precautions to reduce any security or privacy concerns.  Pt understands that this will be treated like an in office visit and we will file with pt's insurance, and there may be a patient responsible charge related to this service. **email to be sent Pt daughter has now asked that the e-mail be sent to :[email or web address], it has been sent and daughter confirmed she received it

## 2018-07-23 NOTE — Telephone Encounter (Signed)
Spoke with Shari Vincent. She stated pt's daughter confirmed she received the email at [email or web address]

## 2018-07-31 ENCOUNTER — Encounter: Payer: Self-pay | Admitting: Neurology

## 2018-07-31 ENCOUNTER — Ambulatory Visit: Payer: Medicare Other | Admitting: Neurology

## 2018-07-31 NOTE — Addendum Note (Signed)
Addended by: Gildardo Griffes on: 07/31/2018 11:31 AM   Modules accepted: Orders

## 2018-07-31 NOTE — Telephone Encounter (Signed)
Spoke with both Fidela Salisbury, pt's children (on DPR), confirmed pt's name & DOB, and updated pt's chart (meds, history, allergies) in preparation for doxy VV on Mon 6/15 @ 11:00 AM. They understand the office will call around 10:30 to check-in and then they should join the meeting around 10:50-10:55. [name] confirmed receipt of the link. She verbalized appreciation for the call.

## 2018-08-05 ENCOUNTER — Ambulatory Visit (INDEPENDENT_AMBULATORY_CARE_PROVIDER_SITE_OTHER): Payer: Medicare Other | Admitting: Neurology

## 2018-08-05 ENCOUNTER — Other Ambulatory Visit: Payer: Self-pay

## 2018-08-05 NOTE — Progress Notes (Signed)
[facility]    Provider:  Dr Jaynee Eagles Referring Provider: Pleas Koch, NP Primary Care Physician:  Pleas Koch, NP  CC:  Follow-up on subarachnoid hemorrhage  Virtual Visit via Telephone Note  I connected with Moreen Fowler on 08/06/18 at 11:00 AM EDT by telephone and verified that I am speaking with the correct person using two identifiers.  Location: Patient: Home Provider: office   I discussed the limitations, risks, security and privacy concerns of performing an evaluation and management service by telephone and the availability of in person appointments. I also discussed with the patient that there may be a patient responsible charge related to this service. The patient expressed understanding and agreed to proceed.   Follow Up Instructions:    I discussed the assessment and treatment plan with the patient. The patient was provided an opportunity to ask questions and all were answered. The patient agreed with the plan and demonstrated an understanding of the instructions.   The patient was advised to call back or seek an in-person evaluation if the symptoms worsen or if the condition fails to improve as anticipated.  I provided 23 minutes of non-face-to-face time during this encounter.   Melvenia Beam, MD    Interval history 08/05/2018: Spoke with daughter and patient. She fell in January 2020, she was face down in a puddle of blood. She hit her head but went to the ED, she is doing well. She is still on vimpat and oxcarb as well as Gabapentin. She uses her cane in the house mostly, sometimes she forgets. She had home PT.   Reviewed CT of the head 03/07/2018: images: 1. No skull fracture or intracranial hemorrhage. 2. No cervical spine fracture or subluxation. 3. Moderate diffuse cerebral and cerebellar atrophy. 4. Moderate chronic small vessel white matter ischemic changes in both cerebral hemispheres. 5. Multilevel cervical spine  degenerative changes. 6. Bilateral carotid artery atheromatous calcifications.  Interval history 10/30/2017: Patient is here for follow-up she has a past medical history of hyperlipidemia, trigeminal neuralgia, cerebral amyloid angiopathy, subarachnoid hemorrhage secondary with symptoms of left-sided numbness and weakness who presented to the emergency room in August with complaints of acute right arm weakness, numbness and tingling while shopping with family.  Patient became dysarthric and aphasic in triage.  She improved from an NIH score 17 down to 1 in the ED.  She was admitted and seen by the stroke team.  The episodes were transient, and resolved in 30 minutes.  There was no evidence of large vessel occlusion on CTA head and neck.  There was no infarct, there is atrophy and small vessel disease, few probable faint punctate acute or early subacute infarctions in the left precentral gyrus and parietal cortex, increased superficial siderosis since 2018.  She cannot take platelets in the setting of amyloid angiopathy.  She was given Trileptal twice daily.  Her statin was changed.    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  Given her cerebral amyloid angiopathy recommended avoiding antiplatelets.  Patient was started on Keppra 500 mg twice a day.  She has been on Trileptal for many years for trigeminal neuralgia for which she is asymptomatic at this time we  discussed repeating EEG, CT of the head and then possibly trying to discontinue Keppra and Trileptal very slowly.  We also ordered home health for gait and safety evaluation and treatment and  risk of falls.   She has been well. She has a new issue some numbness in the left leg. Started a few weeks ago. She was walking around at [employer] and when she walks she has numbness in her leg. In the calf and lower leg, around the leg, better if she sits down, she has sciatica on the right side. She had home therapy which helped with her right leg sciatica. No more episodes of left-sided weakness.    Patient returns today for follow-up.  HPI:  Shari Vincent is a 85 y.o. female here as a referral from Dr. Carlis Abbott for follow-up on subarachnoid hemorrhage. Review of  Records shows: patient was transferred from an outside hospital for evaluation of right-sided subarachnoid hemorrhage in early May of this year. Patient had been having episodes of left-sided numbness and weakness for 2 weeks. This included tingling, weakness, difficulty moving the arm, tingling of the left face, numbness and tingling of the left arm and the outside of her left leg also with a mild facial droop. A CT was obtained and showed a small amount of subarachnoid hemorrhage involving the right frontal convexity CTA showed no obvious aneurysms and she was transferred to [facility] for further management. NIH stroke scale in the emergency room was 1. She is here with her daughter who provides much information. Patient today says she has some residual numbness. It comes and goes. She had headaches but now have resolved. She had side effects to Keppra and she is on Vimpat now. She lives with her husband. They have lived in the same house for 60+ years and have been married that long too. Symptoms had been waxing and waning for a week before they went to the ED. Her arm went weak, then leg and face numbness and tingling. Some mild aphasia with words. No other focal neurologic deficits, associated symptoms, inciting events or modifiable factors. She has imbalance.   Reviewed notes, labs and imaging from outside physicians, which  showed:  Personally reviewed images and agree with the following:  Mr Brain Wo Contrast Mr Angiogram Head Wo Contrast 06/25/2016  1. Trace subarachnoid hemorrhage along the right superior frontal convexity seen in two adjacent sulci. No associated acute infarct or other acute signal abnormality in the region. 2. 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 No other chronic cerebral blood products or superficial siderosis identified. 3. Intracranial MRA appears stable since 2012 with no intracranial stenosis or aneurysm identified. The entire area of subarachnoid hemorrhage was not covered by MRA but no cerebral AVM is suspected. 4. Chronically advanced and mildly progressed nonspecific cerebral white matter signal changes since 2012. There are also two small areas of cortical encephalomalacia in the right inferior frontal and right posterior parietal lobe which are new since 2012. Several tiny chronic cerebellar lacunar infarcts are also new.   Reviewed EEG report which was abnormal demonstrating a very mild diffuse slowing of activity but no focal, hemispheric or lateralizing features. No epileptiform activity was recorded.   Review of Systems: Patient complains of symptoms per HPI as well as the following symptoms: memory loss, headache, numbness, weakness, sleepiness, flushing, moles. Pertinent negatives and positives per HPI. All others negative.  Social History   Socioeconomic History   Marital status: Married    Spouse name: Not on file   Number of children: 3   Years of education: 12   Highest education level: Not on file  Occupational History   Not on file  Social Needs   Financial resource strain: Not on file   Food insecurity    Worry: Not on file    Inability: Not on file   Transportation needs    Medical: Not on file    Non-medical: Not on file  Tobacco  Use   Smoking status: Former Smoker    Types: Cigarettes   Smokeless tobacco: Never Used  Substance and Sexual Activity   Alcohol use: No   Drug use: No   Sexual activity: Not Currently    Birth control/protection: Post-menopausal  Lifestyle   Physical activity    Days per week: Not on file    Minutes per session: Not on file   Stress: Not on file  Relationships   Social connections    Talks on phone: Not on file    Gets together: Not on file    Attends religious service: Not on file    Active member of club or organization: Not on file    Attends meetings of clubs or organizations: Not on file    Relationship status: Not on file   Intimate partner violence    Fear of current or ex partner: Not on file    Emotionally abused: Not on file    Physically abused: Not on file    Forced sexual activity: Not on file  Other Topics Concern   Not on file  Social History Narrative   Married 62 years.   Has three children, six grandchildren, 5 great grandchildren    Enjoys going to church, baking, shop.   Caffeine: occasional tea when eating out   Right-handed   Lives at home with husband. Has a caregiver during the week for 4 hours a day (not overnight).    Family History  Problem Relation Age of Onset   Diabetes Mother    Heart disease Mother        MI   Uterine cancer Mother    Stroke Father    Cancer Paternal Grandmother        intestinal   Stomach cancer Maternal Grandmother    Colon cancer Neg Hx    Breast cancer Neg Hx     Past Medical History:  Diagnosis Date   Acid reflux    Acute cerebrovascul insuff, transient focal neurologic signs/symptoms 06/27/2016   Arthritis    right knee   Bulging lumbar disc    Cancer ([place])    Basal Cell on right shoulder   Colon polyps    Diverticulitis    Fall 02/2018   High cholesterol    Irritable bowel syndrome (IBS)    Kidney stone    Knee pain    right   Sciatic pain    Stroke ([place])  09/2017   Subarachnoid hemorrhage ([facility])    Trigeminal neuralgia    Trigeminal neuralgia of right side of face    Trigeminal neuralgia syndrome    Vertigo     Past Surgical History:  Procedure Laterality Date   BREAST BIOPSY Left    BREAST SURGERY     biopsy in 1990    CATARACT EXTRACTION     COLONOSCOPY  05/27/2008   GMW:NUUVOZDGUY rectal polyp,s/p bx/sigmoid diverticula. hyperplastic polyps. benign random  bx   ESOPHAGOGASTRODUODENOSCOPY  05/27/2008   CHE:NIDPOE/UMPNT HH   EYE SURGERY     FOOT SURGERY     Left foot, metal plate   HYSTEROSCOPY W/D&C N/A 09/03/2014   Procedure: DILATATION AND CURETTAGE /HYSTEROSCOPY;  Surgeon: Donnamae Jude, MD;  Location: [facility];  Service: Gynecology;  Laterality: N/A;  Requesting 09/03/14 @ 2:00p   HYSTEROSCOPY W/D&C N/A 08/19/2015   Procedure: DILATATION AND CURETTAGE /HYSTEROSCOPY;  Surgeon: Donnamae Jude, MD;  Location: [facility];  Service: Gynecology;  Laterality: N/A;   LITHOTRIPSY     OTHER SURGICAL HISTORY     Uterine Prolapse/Tacking   PROLAPSED UTERINE FIBROID LIGATION     in 1960   TOOTH EXTRACTION  09/2017    Current Outpatient Medications  Medication Sig Dispense Refill   Calcium-Vitamin D-Vitamin K (CHEWABLE CALCIUM PO) Take by mouth daily.     Cholecalciferol (D3 MAXIMUM STRENGTH) 5000 units capsule Take 5,000 Units by mouth daily with lunch.      gabapentin (NEURONTIN) 100 MG capsule TAKE 1 CAPSULE BY MOUTH ONCE DAILY AT BEDTIME 90 capsule 3   hyoscyamine (LEVBID) 0.375 MG 12 hr tablet Take 1 tablet (0.375 mg total) by mouth daily. Take one tablet each morning 30 tablet 11   Lacosamide (VIMPAT) 100 MG TABS Take 1 tablet (100 mg total) by mouth 2 (two) times daily. 180 each 0   Liniments (BLUE-EMU SUPER STRENGTH) CREA Apply 1 application topically 3 (three) times daily as needed (for knee and back pain).      meclizine (ANTIVERT) 25 MG tablet Take 1 tablet (25 mg total) by mouth 3 (three) times daily as needed  for dizziness (vertigo). Reported on 05/13/2015 30 tablet 0   OXcarbazepine (TRILEPTAL) 150 MG tablet Take 1 tablet (150 mg total) by mouth 2 (two) times daily. 180 tablet 4   pantoprazole (PROTONIX) 20 MG tablet Take 1 tablet (20 mg total) by mouth 2 (two) times daily. 180 tablet 3   Probiotic Product (PROBIOTIC PO) Take 1 capsule by mouth daily.     rosuvastatin (CRESTOR) 5 MG tablet Take on Monday, Wednesday, Friday at bedtime for cholesterol. 36 tablet 3   No current facility-administered medications for this visit.     Allergies as of 08/05/2018 - Review Complete 07/31/2018  Allergen Reaction Noted   Albuterol Rash 05/24/2014   Cefdinir Diarrhea 07/23/2012   Lactose intolerance (gi) Nausea And Vomiting and Other (See Comments) 09/27/2017   Levofloxacin Nausea Only 10/22/2014   Other Nausea And Vomiting 09/27/2017   Adhesive [tape] Rash 09/27/2017   Epinephrine Anxiety and Palpitations 06/23/2016   Latex Rash 08/09/2015    Vitals: There were no vitals taken for this visit. Last Weight:  Wt Readings from Last 1 Encounters:  03/19/18 153 lb 12.8 oz (69.8 kg)   Last Height:   Ht Readings from Last 1 Encounters:  03/19/18 4\' 11"  (1.499 m)    PRIOR EXAM   Cognition:    The patient is oriented to person, place, and time;     recent and remote memory intact;     language fluent;     normal attention, concentration,     fund of knowledge Cranial Nerves:    The pupils are equal, round, and reactive to light. Attempted fundoscopic exam could not visualize due to small pupils. Visual fields are full to finger confrontation. Extraocular movements are intact. Trigeminal sensation is intact and the muscles of mastication are normal. The face is symmetric. The palate elevates in the  midline. Hearing intact. Voice is normal. Shoulder shrug is normal. The tongue has normal motion without fasciculations.   Coordination:    No dysmetria  Gait:    Imbalance, wide based  gait, cannot tandem heel or toe  Motor Observation:    No asymmetry, no atrophy, and no involuntary movements noted. Tone:    Normal muscle tone.    Posture:    Posture is normal. normal erect    Strength: Proximal weakness     Sensation: intact to LT     Reflex Exam:  DTR's:    Deep tendon reflexes in the upper and lower extremities are symmetrical bilaterally.   Toes:    The toes are equivocal bilaterally.   Clonus:    Clonus is absent.       Assessment/Plan:  This is an 85 year old female with a history of hyperlipidemia, trigeminal neuralgia, diverticulitis. 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 Last admission for right arm weakness, numbness and tingling, aphasia with MRI showing one possible (but not definite) faint punctate acute/early subacute infarctions in left precentral gyrus, moderate chronic microvascular changes and Increased superficial siderosis over the cerebral convexities from 2018.   - Diagnosed with Focal transient neurologic episode in the setting of progression of CAA, she was continued on her Vimpat and Trileptal restarted inpatient last admission.  - May consider decreasing trileptal, after Covid coming for EEG  - cerebral amyloid angiopathy recommend avoiding antiplatelet meds.   - Continue Vimpat 100mg  bid and Trileptal 150mg  bid. Seizure precautions.   - change to crestor, goal ldl < 70  - continue rehab as needed  - fall precautions  Meds ordered this encounter  Medications   Lacosamide (VIMPAT) 100 MG TABS    Sig: Take 1 tablet (100 mg total) by mouth 2 (two) times daily.    Dispense:  180 each    Refill:  4   OXcarbazepine (TRILEPTAL) 150 MG tablet    Sig: Take 1 tablet (150 mg total) by mouth 2  (two) times daily.    Dispense:  180 tablet    Refill:  Tinley Park, MD  [facility] [address] [place], [place] [address]  Phone [phone] Fax [phone]

## 2018-08-06 MED ORDER — VIMPAT 100 MG PO TABS: 1.0000 | ORAL_TABLET | Freq: Two times a day (BID) | ORAL | 4 refills | Status: DC

## 2018-08-06 MED ORDER — OXCARBAZEPINE 150 MG PO TABS: 150.0000 mg | ORAL_TABLET | Freq: Two times a day (BID) | ORAL | 4 refills | Status: DC

## 2018-09-09 ENCOUNTER — Ambulatory Visit (INDEPENDENT_AMBULATORY_CARE_PROVIDER_SITE_OTHER): Payer: Medicare Other | Admitting: Gastroenterology

## 2018-09-09 ENCOUNTER — Other Ambulatory Visit: Payer: Self-pay

## 2018-09-09 ENCOUNTER — Encounter: Payer: Self-pay | Admitting: Gastroenterology

## 2018-09-09 DIAGNOSIS — K580 Irritable bowel syndrome with diarrhea: Secondary | ICD-10-CM

## 2018-09-09 DIAGNOSIS — K219 Gastro-esophageal reflux disease without esophagitis: Secondary | ICD-10-CM | POA: Diagnosis not present

## 2018-09-09 NOTE — Patient Instructions (Addendum)
1. Continue hyoscyamine (Levbid) once daily each morning.  2. Continue pantoprazole 20mg  twice daily before a meal. 3. Monitor your weight weekly, if you noticed your weight has dropped for than 5 pounds let me know.  4. If you feel like your appetite worsens let me know. 5. We will plan to see you back in 3 months but if you have any problems or concerns, call us.

## 2018-09-09 NOTE — Progress Notes (Signed)
cc'ed to pcp °

## 2018-09-09 NOTE — Progress Notes (Signed)
Primary Care Physician:  Pleas Koch, NP Primary GI:  Garfield Cornea, MD    Patient Location: Home  Provider Location: [facility] office  Reason for Phone Visit: f/u gerd, IBS  Persons present on the phone encounter, with roles: Patient, daughter in law, Shari Vincent, myself (provider),Miny Quincy Simmonds CMA(updated meds and allergies)  Total time (minutes) spent on medical discussion: 15 minutes  Due to COVID-19, visit was conducted using telephonic method (no video was available).  Visit was requested by patient.  Virtual Visit via Telephone only  I connected with Shari Vincent on 09/09/18 at 10:30 AM EDT by telephone and verified that I am speaking with the correct person using two identifiers.   I discussed the limitations, risks, security and privacy concerns of performing an evaluation and management service by telephone and the availability of in person appointments. I also discussed with the patient that there may be a patient responsible charge related to this service. The patient expressed understanding and agreed to proceed.  Chief Complaint  Patient presents with  . Gastroesophageal Reflux    f/u. Seems to be not interested in food  . Irritable Bowel Syndrome    levsin has helped a lot, started taking daily and since has helped    HPI:   Patient is a pleasant 85 y/o female who presents for telephone visit regarding follow-up GERD, IBS.  Last seen in January.  At that time reflux well controlled on pantoprazole 20 mg twice daily.  Was having some abdominal cramps and loose stools, previously on Levsin sublingual.  With attack she would actually have vasovagal symptoms, passed out in the bathroom on more than one occasion.  Syncope only associated with stooling episodes.  Levsin was switched to long-acting Levbid once daily to try to prevent further episodes.  Levbid has been helping a lot.  Takes before breakfast once a day. Little accident/fecal incontinence last week, no  abdominal pain.  Feels like it was related to eating too many sweets and fruit. No melena, brbpr. No heartburn or indigestion. Daughter after her to eat more protein. Appetite with some decline but not sure if a trend or just a phase. Patient reports heartburn much better. No pain with meals, no nausea. Notes she is a picky eater. Doesn't like red meat.   Patient weighs every morning, 151-153 pounds at home.  Wt Readings from Last 3 Encounters:  03/19/18 153 lb 12.8 oz (69.8 kg)  03/18/18 153 lb 12 oz (69.7 kg)  03/08/18 159 lb 13.3 oz (72.5 kg)     Current Outpatient Medications  Medication Sig Dispense Refill  . Calcium-Vitamin D-Vitamin K (CHEWABLE CALCIUM PO) Take by mouth daily.    . Cholecalciferol (D3 MAXIMUM STRENGTH) 5000 units capsule Take 5,000 Units by mouth daily with lunch.     . gabapentin (NEURONTIN) 100 MG capsule TAKE 1 CAPSULE BY MOUTH ONCE DAILY AT BEDTIME 90 capsule 3  . hyoscyamine (LEVBID) 0.375 MG 12 hr tablet Take 1 tablet (0.375 mg total) by mouth daily. Take one tablet each morning 30 tablet 11  . Lacosamide (VIMPAT) 100 MG TABS Take 1 tablet (100 mg total) by mouth 2 (two) times daily. 180 each 4  . Liniments (BLUE-EMU SUPER STRENGTH) CREA Apply 1 application topically 3 (three) times daily as needed (for knee and back pain).     . meclizine (ANTIVERT) 25 MG tablet Take 1 tablet (25 mg total) by mouth 3 (three) times daily as needed for dizziness (  vertigo). Reported on 05/13/2015 30 tablet 0  . OXcarbazepine (TRILEPTAL) 150 MG tablet Take 1 tablet (150 mg total) by mouth 2 (two) times daily. 180 tablet 4  . pantoprazole (PROTONIX) 20 MG tablet Take 1 tablet (20 mg total) by mouth 2 (two) times daily. 180 tablet 3  . Probiotic Product (PROBIOTIC PO) Take 1 capsule by mouth daily.    . rosuvastatin (CRESTOR) 5 MG tablet Take on Monday, Wednesday, Friday at bedtime for cholesterol. 36 tablet 3   No current facility-administered medications for this visit.     Past  Medical History:  Diagnosis Date  . Acid reflux   . Acute cerebrovascul insuff, transient focal neurologic signs/symptoms 06/27/2016  . Arthritis    right knee  . Bulging lumbar disc   . Cancer ([place])    Basal Cell on right shoulder  . Colon polyps   . Diverticulitis   . Fall 02/2018  . High cholesterol   . Irritable bowel syndrome (IBS)   . Kidney stone   . Knee pain    right  . Sciatic pain   . Stroke ([place]) 09/2017  . Subarachnoid hemorrhage ([place])   . Trigeminal neuralgia   . Trigeminal neuralgia of right side of face   . Trigeminal neuralgia syndrome   . Vertigo     Past Surgical History:  Procedure Laterality Date  . BREAST BIOPSY Left   . BREAST SURGERY     biopsy in 1990   . CATARACT EXTRACTION    . COLONOSCOPY  05/27/2008   WIO:[id number] rectal polyp,s/p bx/sigmoid diverticula. hyperplastic polyps. benign random bx  . ESOPHAGOGASTRODUODENOSCOPY  05/27/2008   QAS:[id number]/IWLNL HH  . EYE SURGERY    . FOOT SURGERY     Left foot, metal plate  . HYSTEROSCOPY W/D&C N/A 09/03/2014   Procedure: DILATATION AND CURETTAGE /HYSTEROSCOPY;  Surgeon: Donnamae Jude, MD;  Location: [facility];  Service: Gynecology;  Laterality: N/A;  Requesting 09/03/14 @ 2:00p  . HYSTEROSCOPY W/D&C N/A 08/19/2015   Procedure: DILATATION AND CURETTAGE /HYSTEROSCOPY;  Surgeon: Donnamae Jude, MD;  Location: [facility];  Service: Gynecology;  Laterality: N/A;  . LITHOTRIPSY    . OTHER SURGICAL HISTORY     Uterine Prolapse/Tacking  . PROLAPSED UTERINE FIBROID LIGATION     in 1960  . TOOTH EXTRACTION  09/2017    Family History  Problem Relation Age of Onset  . Diabetes Mother   . Heart disease Mother        MI  . Uterine cancer Mother   . Stroke Father   . Cancer Paternal Grandmother        intestinal  . Stomach cancer Maternal Grandmother   . Colon cancer Neg Hx   . Breast cancer Neg Hx     Social History   Socioeconomic History  . Marital status: Married    Spouse name: Not on file  .  Number of children: 3  . Years of education: 27  . Highest education level: Not on file  Occupational History  . Not on file  Social Needs  . Financial resource strain: Not on file  . Food insecurity    Worry: Not on file    Inability: Not on file  . Transportation needs    Medical: Not on file    Non-medical: Not on file  Tobacco Use  . Smoking status: Former Smoker    Types: Cigarettes  . Smokeless tobacco: Never Used  Substance and Sexual Activity  . Alcohol use:  No  . Drug use: No  . Sexual activity: Not Currently    Birth control/protection: Post-menopausal  Lifestyle  . Physical activity    Days per week: Not on file    Minutes per session: Not on file  . Stress: Not on file  Relationships  . Social Herbalist on phone: Not on file    Gets together: Not on file    Attends religious service: Not on file    Active member of club or organization: Not on file    Attends meetings of clubs or organizations: Not on file    Relationship status: Not on file  . Intimate partner violence    Fear of current or ex partner: Not on file    Emotionally abused: Not on file    Physically abused: Not on file    Forced sexual activity: Not on file  Other Topics Concern  . Not on file  Social History Narrative   Married 62 years.   Has three children, six grandchildren, 5 great grandchildren    Enjoys going to church, baking, shop.   Caffeine: occasional tea when eating out   Right-handed   Lives at home with husband. Has a caregiver during the week for 4 hours a day (not overnight).      ROS:  General: Negative for anorexia, weight loss, fever, chills, fatigue, weakness. Eyes: Negative for vision changes.  ENT: Negative for hoarseness, difficulty swallowing , nasal congestion. CV: Negative for chest pain, angina, palpitations, dyspnea on exertion, peripheral edema.  Respiratory: Negative for dyspnea at rest, dyspnea on exertion, cough, sputum, wheezing.  GI: See  history of present illness. GU:  Negative for dysuria, hematuria, urinary incontinence, urinary frequency, nocturnal urination.  MS: Negative for joint pain, low back pain.  Derm: Negative for rash or itching.  Neuro: Negative for weakness, abnormal sensation, seizure, frequent headaches, memory loss, confusion.  Psych: Negative for anxiety, depression, suicidal ideation, hallucinations.  Endo: Negative for unusual weight change.  Heme: Negative for bruising or bleeding. Allergy: Negative for rash or hives.   Observations/Objective: Pleasant, cooperative elderly female. Answers questions appropriately. No acute distress, able to carry on conversation without SOB.   Assessment and Plan: Pleasant 85 year old female with history of chronic GERD and IBS.  Currently reflux is well controlled on pantoprazole 20 mg twice daily.  Daughter-in-law, Shari Vincent, has some concerns about patient's appetite being somewhat diminished.  More of a recent change, not clear whether it to trend or just a phase.  Fortunately patient's weight has been stable. Nan asked regarding possibility of appetite stimulant, patient need to have this or not.  Discussed holding off as long she is maintaining her weight.  If she is unable to maintain her weight she may require further work-up prior to consideration of an appetite stimulant.  Patient and daughter-in-law would like to monitor her symptoms.  She was weighed weekly and reported she drops more than 5 pounds.  Regarding IBS, she is doing remarkably better on long-acting hyoscyamine.  Return to the office in 3 months.  Follow Up Instructions:    I discussed the assessment and treatment plan with the patient. The patient was provided an opportunity to ask questions and all were answered. The patient agreed with the plan and demonstrated an understanding of the instructions. AVS mailed to patient's home address.   The patient was advised to call back or seek an in-person  evaluation if the symptoms worsen or if the condition fails  to improve as anticipated.  I provided 15 minutes of non-face-to-face time during this encounter.   Neil Crouch, PA-C

## 2018-10-07 ENCOUNTER — Other Ambulatory Visit: Payer: Self-pay | Admitting: Primary Care

## 2018-10-07 DIAGNOSIS — M5430 Sciatica, unspecified side: Secondary | ICD-10-CM

## 2018-10-08 ENCOUNTER — Other Ambulatory Visit: Payer: Self-pay | Admitting: Primary Care

## 2018-10-08 DIAGNOSIS — M5430 Sciatica, unspecified side: Secondary | ICD-10-CM

## 2018-10-23 ENCOUNTER — Telehealth: Payer: Self-pay

## 2018-10-23 ENCOUNTER — Emergency Department (HOSPITAL_COMMUNITY)
Admission: EM | Admit: 2018-10-23 | Discharge: 2018-10-23 | Disposition: A | Discharge status: Home / Self Care | Payer: Medicare Other | Attending: Emergency Medicine | Admitting: Emergency Medicine

## 2018-10-23 ENCOUNTER — Encounter (HOSPITAL_COMMUNITY): Payer: Self-pay | Admitting: Emergency Medicine

## 2018-10-23 DIAGNOSIS — Z85828 Personal history of other malignant neoplasm of skin: Secondary | ICD-10-CM | POA: Diagnosis not present

## 2018-10-23 DIAGNOSIS — Z87891 Personal history of nicotine dependence: Secondary | ICD-10-CM | POA: Diagnosis not present

## 2018-10-23 DIAGNOSIS — Z91040 Latex allergy status: Secondary | ICD-10-CM | POA: Diagnosis not present

## 2018-10-23 DIAGNOSIS — Z79899 Other long term (current) drug therapy: Secondary | ICD-10-CM | POA: Diagnosis not present

## 2018-10-23 DIAGNOSIS — Z9889 Other specified postprocedural states: Secondary | ICD-10-CM | POA: Diagnosis not present

## 2018-10-23 DIAGNOSIS — R404 Transient alteration of awareness: Secondary | ICD-10-CM | POA: Insufficient documentation

## 2018-10-23 DIAGNOSIS — R4189 Other symptoms and signs involving cognitive functions and awareness: Secondary | ICD-10-CM

## 2018-10-23 DIAGNOSIS — K08409 Partial loss of teeth, unspecified cause, unspecified class: Secondary | ICD-10-CM

## 2018-10-23 MED ORDER — ACETAMINOPHEN 325 MG PO TABS
325.0000 mg | ORAL_TABLET | Freq: Once | ORAL | Status: AC
  Administered 2018-10-23: 325 mg via ORAL
  Filled 2018-10-23: qty 1

## 2018-10-23 NOTE — Telephone Encounter (Signed)
Noted  

## 2018-10-23 NOTE — Telephone Encounter (Signed)
Mr Cavazos [facility] signed) said that pt is still at Dr Donnetta Simpers oral surgeons office and request I call Dr Maryan Puls office. I spoke with Dr Donnetta Simpers; Dr Donnetta Simpers said pt had 3 teeth removed under local anesthesia; pt was somewhat apprehensive but Dr Donnetta Simpers said pt did well thru the procedure; vital signs were stable; pt was slow to respond after procedure. Pt would not speak; BS was 108 and pt had already eaten.Pt has hx of TIA. Pt is speaking now and doing better but pt still wobbly and has H/A. pts daughter said she will take pt to [facility] ED for eval and possible imaging. pts daughter wants me to call [facility] to let them know what happened and pt is on the way by car to [facility] ED. I spoke with triage nurse Janett Billow and she voiced understanding. FYI to [name] FNP. [name] out of office until 11/04/18.

## 2018-10-23 NOTE — ED Triage Notes (Signed)
Pt arrives to ED after an oral surgery where she had 3 teeth removed and after procedure was having headache and family member states was having a panic attack. Pt is now back to baseline and states she just has a small headache in the front of her head. Pt is alert and 0x4.  Pt has no weakness, numbness no drift. No neuro deficits

## 2018-10-23 NOTE — ED Provider Notes (Signed)
[facility] EMERGENCY DEPARTMENT Provider Note   CSN: [id number] Arrival date & time: 10/23/18  1201     History   Chief Complaint Chief Complaint  Patient presents with  . Dental Problem    HPI Shari Vincent is a 85 y.o. female.     Patient had multiple dental extractions this AM. Afterwards pt was felt to 'have panic attack'. Family indicates pt was quite anxious prior to and during procedure and that it took a few minutes for patient to respond normally to them. No seizure activity noted. No loss of consciousness. No trauma or fall. Patient sent from oral surgery office to ED. In ED, patient had noted a slight frontal headache, received acetaminophen, and notes headache resolved. Denies headache being abrupt, severe, and was not a 'worst' headache. Patient denies any change in speech or vision. No numbness or weakness. No change in balance or coordination - at baseline uses cane or walker. Patient denies any associated chest pain or discomfort. No sob or unusual doe. No palpitations.  Pt has eaten/drank very little today. Pt currently reports feeling at baseline.   The history is provided by the patient and a relative.    Past Medical History:  Diagnosis Date  . Acid reflux   . Acute cerebrovascul insuff, transient focal neurologic signs/symptoms 06/27/2016  . Arthritis    right knee  . Bulging lumbar disc   . Cancer ([place])    Basal Cell on right shoulder  . Colon polyps   . Diverticulitis   . Fall 02/2018  . High cholesterol   . Irritable bowel syndrome (IBS)   . Kidney stone   . Knee pain    right  . Sciatic pain   . Stroke ([place]) 09/2017  . Subarachnoid hemorrhage ([place])   . Trigeminal neuralgia   . Trigeminal neuralgia of right side of face   . Trigeminal neuralgia syndrome   . Vertigo     Patient Active Problem List   Diagnosis Date Noted  . Concussion with brief loss of consciousness 03/08/2018  . Syncope 03/07/2018  . Leukocytosis  03/07/2018  . TIA (transient ischemic attack) 09/27/2017  . Preventative health care 12/13/2016  . Urinary incontinence 10/04/2016  . Family hx-stroke 06/27/2016  . Cerebral amyloid angiopathy (CODE) 06/26/2016  . 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 Subarachnoid hemorrhage ([place]) 06/25/2016  . Upper extremity weakness 06/23/2016  . Osteoporosis 10/22/2014  . Pelvic mass in female 08/25/2014  . PMB (postmenopausal bleeding) 08/06/2014  . Medicare annual wellness visit, subsequent 06/17/2014  . IBS (irritable bowel syndrome) 05/26/2014  . Right-sided low back pain with right-sided sciatica 05/26/2014  . Trigeminal neuralgia 05/26/2014  . OA (osteoarthritis) of knee 07/23/2012  . Venous stasis 07/23/2012  . COLONIC POLYPS, HYPERPLASTIC 08/14/2008  . Hyperlipidemia 08/14/2008  . GERD 08/14/2008  . VERTIGO 08/14/2008    Past Surgical History:  Procedure Laterality Date  . BREAST BIOPSY Left   . BREAST SURGERY     biopsy in 1990   . CATARACT EXTRACTION    . COLONOSCOPY  05/27/2008   [id number] rectal polyp,s/p bx/sigmoid diverticula. hyperplastic polyps. benign random bx  . ESOPHAGOGASTRODUODENOSCOPY  05/27/2008   [id number] HH  . EYE SURGERY    . FOOT SURGERY     Left foot, metal plate  . HYSTEROSCOPY W/D&C N/A 09/03/2014   Procedure: DILATATION AND CURETTAGE /HYSTEROSCOPY;  Surgeon: Donnamae Jude, MD;  Location: [facility];  Service: Gynecology;  Laterality: N/A;  Requesting 09/03/14 @ 2:00p  .  HYSTEROSCOPY W/D&C N/A 08/19/2015   Procedure: DILATATION AND CURETTAGE /HYSTEROSCOPY;  Surgeon: Donnamae Jude, MD;  Location: [facility];  Service: Gynecology;  Laterality: N/A;  . LITHOTRIPSY    . OTHER SURGICAL HISTORY     Uterine Prolapse/Tacking  . PROLAPSED UTERINE FIBROID LIGATION     in 1960  . TOOTH EXTRACTION  09/2017     OB History    Gravida  5   Para  3   Term      Preterm  3   AB  2   Living  3     SAB  2   TAB      Ectopic      Multiple      Live  Births               Home Medications    Prior to Admission medications   Medication Sig Start Date End Date Taking? Authorizing Provider  Calcium-Vitamin D-Vitamin K (CHEWABLE CALCIUM PO) Take by mouth daily.    [provider]  Cholecalciferol (D3 MAXIMUM STRENGTH) 5000 units capsule Take 5,000 Units by mouth daily with lunch.     [provider]  gabapentin (NEURONTIN) 100 MG capsule TAKE ONE CAPSULE BY MOUTH AT BEDTIME 10/08/18   Pleas Koch, NP  hyoscyamine (LEVBID) 0.375 MG 12 hr tablet Take 1 tablet (0.375 mg total) by mouth daily. Take one tablet each morning 03/19/18   Rourk, Cristopher Estimable, MD  Lacosamide (VIMPAT) 100 MG TABS Take 1 tablet (100 mg total) by mouth 2 (two) times daily. 08/06/18   Melvenia Beam, MD  Liniments (BLUE-EMU SUPER STRENGTH) CREA Apply 1 application topically 3 (three) times daily as needed (for knee and back pain).     [provider]  meclizine (ANTIVERT) 25 MG tablet Take 1 tablet (25 mg total) by mouth 3 (three) times daily as needed for dizziness (vertigo). Reported on 05/13/2015 03/18/18   Pleas Koch, NP  OXcarbazepine (TRILEPTAL) 150 MG tablet Take 1 tablet (150 mg total) by mouth 2 (two) times daily. 08/06/18   Melvenia Beam, MD  pantoprazole (PROTONIX) 20 MG tablet Take 1 tablet (20 mg total) by mouth 2 (two) times daily. 03/19/18   Rourk, Cristopher Estimable, MD  Probiotic Product (PROBIOTIC PO) Take 1 capsule by mouth daily.    [provider]  rosuvastatin (CRESTOR) 5 MG tablet Take on Monday, Wednesday, Friday at bedtime for cholesterol. 03/18/18   Pleas Koch, NP    Family History Family History  Problem Relation Age of Onset  . Diabetes Mother   . Heart disease Mother        MI  . Uterine cancer Mother   . Stroke Father   . Cancer Paternal Grandmother        intestinal  . Stomach cancer Maternal Grandmother   . Colon cancer Neg Hx   . Breast cancer Neg Hx     Social History Social History    Tobacco Use  . Smoking status: Former Smoker    Types: Cigarettes  . Smokeless tobacco: Never Used  Substance Use Topics  . Alcohol use: No  . Drug use: No     Allergies   Albuterol, Cefdinir, Lactose intolerance (gi), Levofloxacin, Other, Adhesive [tape], Epinephrine, and Latex   Review of Systems Review of Systems  Constitutional: Negative for chills and fever.  HENT: Negative for sore throat.   Eyes: Negative for visual disturbance.  Respiratory: Negative for shortness of breath.  Cardiovascular: Negative for chest pain and palpitations.  Gastrointestinal: Negative for abdominal pain, blood in stool and vomiting.       No recent blood loss.   Genitourinary: Negative for flank pain.  Musculoskeletal: Negative for back pain.  Skin: Negative for rash.  Neurological: Negative for speech difficulty, weakness and numbness.  Hematological: Does not bruise/bleed easily.  Psychiatric/Behavioral:       Anxious earlier, currently mental status reported as c/w baseline.      Physical Exam Updated Vital Signs BP (!) 146/80 (BP Location: Left Arm)   Pulse 66   Temp 97.6 F (36.4 C) (Oral)   Resp 18   SpO2 97%   Physical Exam Vitals signs and nursing note reviewed.  Constitutional:      Appearance: Normal appearance. She is well-developed.  HENT:     Head: Atraumatic.     Right Ear: Tympanic membrane normal.     Left Ear: Tympanic membrane normal.     Nose: Nose normal.     Mouth/Throat:     Mouth: Mucous membranes are moist.     Pharynx: Oropharynx is clear.     Comments: No bleeding.  Eyes:     General: No scleral icterus.    Conjunctiva/sclera: Conjunctivae normal.     Pupils: Pupils are equal, round, and reactive to light.  Neck:     Musculoskeletal: Normal range of motion and neck supple. No neck rigidity or muscular tenderness.     Vascular: No carotid bruit.     Trachea: No tracheal deviation.     Comments: No bruits.  Cardiovascular:     Rate and  Rhythm: Normal rate and regular rhythm.     Pulses: Normal pulses.     Heart sounds: Normal heart sounds. No murmur. No friction rub. No gallop.   Pulmonary:     Effort: Pulmonary effort is normal. No respiratory distress.     Breath sounds: Normal breath sounds.  Abdominal:     General: Bowel sounds are normal. There is no distension.     Palpations: Abdomen is soft.     Tenderness: There is no abdominal tenderness. There is no guarding.  Genitourinary:    Comments: No cva tenderness.  Musculoskeletal:        General: No swelling.     Right lower leg: No edema.     Left lower leg: No edema.  Skin:    General: Skin is warm and dry.     Findings: No rash.  Neurological:     Mental Status: She is alert.     Comments: Alert, speech normal. Mental status/gait/functional ability describes as c/w baseline. Motor intact bil, stre 5/5. No pronator drift. sens grossly intact. Steady gait.   Psychiatric:        Mood and Affect: Mood normal.      ED Treatments / Results  Labs (all labs ordered are listed, but only abnormal results are displayed) Labs Reviewed - No data to display  EKG None  Radiology No results found.  Procedures Procedures (including critical care time)  Medications Ordered in ED Medications  acetaminophen (TYLENOL) tablet 325 mg (325 mg Oral Given 10/23/18 1640)     Initial Impression / Assessment and Plan / ED Course  I have reviewed the triage vital signs and the nursing notes.  Pertinent labs & imaging results that were available during my care of the patient were reviewed by me and considered in my medical decision making (see chart for details).  Reviewed nursing notes and prior charts for additional history.   Triage rn had provided acetaminophen to patient.   Patient with prolonged wait in waiting room due to no ED bed availability/boarding of multiple admitted patients - patient brought into triage room/space to facilitate being seen.   Patient  indicates feels at baseline, denies current c/o, no pain, no fever, no faintness or dizziness, no neurologic c/os.   Pt provided po fluids.   Patient afebrile, reports feeling at baseline.  Pt currently appears stable for d/c.   Return precautions discussed and provided.     Final Clinical Impressions(s) / ED Diagnoses   Final diagnoses:  Unresponsive episode  Status post tooth extraction    ED Discharge Orders    None       Lajean Saver, MD 10/23/18 1749

## 2018-10-23 NOTE — Discharge Instructions (Addendum)
It was our pleasure to provide your ER care today - we hope that you feel better.  Rest. Drink plenty of fluids.  Return to ER right away if worse, new symptoms, fevers, new or severe pain, chest pain, trouble breathing, weak/fainting, or other concern.

## 2018-10-23 NOTE — ED Notes (Signed)
Daughter ask for some Tylenol for her post surgery pain.  Will give pt. Tylenol 325mg . po

## 2018-11-20 ENCOUNTER — Encounter: Payer: Self-pay | Admitting: Internal Medicine

## 2018-12-27 ENCOUNTER — Encounter: Payer: Self-pay | Admitting: Internal Medicine

## 2018-12-27 ENCOUNTER — Inpatient Hospital Stay: Admission: AD | Admit: 2018-12-27 | Payer: Medicare Other | Admitting: Internal Medicine

## 2018-12-30 ENCOUNTER — Ambulatory Visit: Payer: Medicare Other

## 2018-12-30 ENCOUNTER — Telehealth: Payer: Self-pay

## 2018-12-30 NOTE — Telephone Encounter (Signed)
Dominica Severin, pt's son, called.  Pt was in hospital in [place] this past Fri and Sat.  He will try to get records sent here for tomorrow's OV.

## 2018-12-30 NOTE — Telephone Encounter (Signed)
Noted  

## 2018-12-31 ENCOUNTER — Ambulatory Visit: Payer: Medicare Other

## 2018-12-31 ENCOUNTER — Other Ambulatory Visit: Payer: Self-pay

## 2018-12-31 ENCOUNTER — Ambulatory Visit (INDEPENDENT_AMBULATORY_CARE_PROVIDER_SITE_OTHER): Payer: Medicare Other | Admitting: Primary Care

## 2018-12-31 ENCOUNTER — Encounter: Payer: Self-pay | Admitting: Primary Care

## 2018-12-31 VITALS — BP 124/84 | HR 78 | Temp 97.6°F | Ht 59.0 in | Wt 156.5 lb

## 2018-12-31 DIAGNOSIS — I609 Nontraumatic subarachnoid hemorrhage, unspecified: Secondary | ICD-10-CM

## 2018-12-31 DIAGNOSIS — K580 Irritable bowel syndrome with diarrhea: Secondary | ICD-10-CM | POA: Diagnosis not present

## 2018-12-31 DIAGNOSIS — M810 Age-related osteoporosis without current pathological fracture: Secondary | ICD-10-CM

## 2018-12-31 DIAGNOSIS — K219 Gastro-esophageal reflux disease without esophagitis: Secondary | ICD-10-CM

## 2018-12-31 DIAGNOSIS — G500 Trigeminal neuralgia: Secondary | ICD-10-CM

## 2018-12-31 DIAGNOSIS — M5441 Lumbago with sciatica, right side: Secondary | ICD-10-CM

## 2018-12-31 DIAGNOSIS — R55 Syncope and collapse: Secondary | ICD-10-CM

## 2018-12-31 DIAGNOSIS — G8929 Other chronic pain: Secondary | ICD-10-CM

## 2018-12-31 DIAGNOSIS — E785 Hyperlipidemia, unspecified: Secondary | ICD-10-CM | POA: Diagnosis not present

## 2018-12-31 DIAGNOSIS — I680 Cerebral amyloid angiopathy: Secondary | ICD-10-CM

## 2018-12-31 NOTE — Assessment & Plan Note (Signed)
Following with neurology 

## 2018-12-31 NOTE — Assessment & Plan Note (Signed)
Overall doing well on current regimen except for what sounds to be vasovagal syncope.  Continue current regimen. Work up in process for syncope.

## 2018-12-31 NOTE — Patient Instructions (Signed)
Stop by the lab prior to leaving today. I will notify you of your results once received.   Be sure to hydrate well with water, drink at least 4 bottles of water daily if possible.   You will be contacted regarding your referral to cardiology.  Please let us know if you have not been contacted within two weeks.   It was a pleasure to see you today!

## 2018-12-31 NOTE — Assessment & Plan Note (Signed)
Noted on CT scan of head during recent ED visit in [place], I do not have those records. Son reports this resolved with second scan.  Neuro exam unremarkable today. Will obtain records.

## 2018-12-31 NOTE — Assessment & Plan Note (Signed)
Repeat lipid panel pending. Compliant to Crestor, continue same.

## 2018-12-31 NOTE — Assessment & Plan Note (Signed)
Recent episode of syncope with ED evaluation, this is the second occurrence of syncope within the last 11 months.  Orthostatic vitals negative. Will obtain records from recent hospital visit. Neuro exam unremarkable. Check labs including CBC, TSH, CMP.  Referral placed to cardiology for further insight into syncope.

## 2018-12-31 NOTE — Assessment & Plan Note (Signed)
Following with neurology, compliant to Trileptal and Vimpat.

## 2018-12-31 NOTE — Progress Notes (Signed)
Subjective:    Patient ID: Shari Vincent, female    DOB: [birth year], 85 y.o.   MRN: [id number]  HPI  Shari Vincent is a 85 year old female who presents today for emergency department follow up and evaluation for syncope. We do not have records, HPI is coming from her son and patient.  She presented to [facility] in [place], [place] on 12/27/18. Her family found her on the floor in the bathroom and was unresponsive initially so they called EMS.  Paramedics took her to the emergency department for further evaluation.  She underwent CT scans of her head x 2, CT chest, abdomen/pelvis, c-spine. During the first CT scan of her head there was minimal subarachnoid hemorrhage, CT head repeated hours later which showed that the bleeding resolved. There were plans for admission to [facility] but they didn't have a room available so admission was cancelled. She was discharged home.  She was on the bathroom floor as she endorses a syncopal episode just after having a bowel movement. History of IBS for years and is managed on hyoscyamine daily, following with GI.  IBS symptoms are overall much better since initiated on hyoscyamine.   History of syncopal episode in January 2020 with admission diagnosis of vasovagal syncope, she was having a bowel movement just prior to syncopal episode. Underwent 2D echo (without acute findings) and carotid ultrasound (stable). Two months ago she had several teeth pulled due to a growth, had a panic attack shortly after, evaluated at the ED at [facility] and released.   She denies near syncope, feeling dizzy on a daily basis or with positional changes. Her son reports that these episodes occur only when or just after having a bowel movement. She has bowel movements every two days overall, firm in consistency. When taking Colace she has daily bowel movements.    BP Readings from Last 3 Encounters:  12/31/18 124/84  10/23/18 (!) 146/80  03/19/18 134/73      Review  of Systems  Respiratory: Negative for shortness of breath.   Cardiovascular: Negative for chest pain.  Neurological: Negative for dizziness, weakness, light-headedness and headaches.       Past Medical History:  Diagnosis Date  . Acid reflux   . Acute cerebrovascul insuff, transient focal neurologic signs/symptoms 06/27/2016  . Arthritis    right knee  . Bulging lumbar disc   . Cancer ([place])    Basal Cell on right shoulder  . Colon polyps   . Diverticulitis   . Fall 02/2018  . High cholesterol   . Irritable bowel syndrome (IBS)   . Kidney stone   . Knee pain    right  . Sciatic pain   . Stroke ([place]) 09/2017  . Subarachnoid hemorrhage ([place])   . Trigeminal neuralgia   . Trigeminal neuralgia of right side of face   . Trigeminal neuralgia syndrome   . Vertigo      Social History   Socioeconomic History  . Marital status: Married    Spouse name: Not on file  . Number of children: 3  . Years of education: 66  . Highest education level: Not on file  Occupational History  . Not on file  Social Needs  . Financial resource strain: Not on file  . Food insecurity    Worry: Not on file    Inability: Not on file  . Transportation needs    Medical: Not on file    Non-medical: Not on file  Tobacco Use  . Smoking status: Former Smoker    Types: Cigarettes  . Smokeless tobacco: Never Used  Substance and Sexual Activity  . Alcohol use: No  . Drug use: No  . Sexual activity: Not Currently    Birth control/protection: Post-menopausal  Lifestyle  . Physical activity    Days per week: Not on file    Minutes per session: Not on file  . Stress: Not on file  Relationships  . Social Herbalist on phone: Not on file    Gets together: Not on file    Attends religious service: Not on file    Active member of club or organization: Not on file    Attends meetings of clubs or organizations: Not on file    Relationship status: Not on file  . Intimate partner violence     Fear of current or ex partner: Not on file    Emotionally abused: Not on file    Physically abused: Not on file    Forced sexual activity: Not on file  Other Topics Concern  . Not on file  Social History Narrative   Married 62 years.   Has three children, six grandchildren, 5 great grandchildren    Enjoys going to church, baking, shop.   Caffeine: occasional tea when eating out   Right-handed   Lives at home with husband. Has a caregiver during the week for 4 hours a day (not overnight).    Past Surgical History:  Procedure Laterality Date  . BREAST BIOPSY Left   . BREAST SURGERY     biopsy in 1990   . CATARACT EXTRACTION    . COLONOSCOPY  05/27/2008   [id number] rectal polyp,s/p bx/sigmoid diverticula. hyperplastic polyps. benign random bx  . ESOPHAGOGASTRODUODENOSCOPY  05/27/2008   [id number] HH  . EYE SURGERY    . FOOT SURGERY     Left foot, metal plate  . HYSTEROSCOPY W/D&C N/A 09/03/2014   Procedure: DILATATION AND CURETTAGE /HYSTEROSCOPY;  Surgeon: Donnamae Jude, MD;  Location: [facility];  Service: Gynecology;  Laterality: N/A;  Requesting 09/03/14 @ 2:00p  . HYSTEROSCOPY W/D&C N/A 08/19/2015   Procedure: DILATATION AND CURETTAGE /HYSTEROSCOPY;  Surgeon: Donnamae Jude, MD;  Location: [facility];  Service: Gynecology;  Laterality: N/A;  . LITHOTRIPSY    . OTHER SURGICAL HISTORY     Uterine Prolapse/Tacking  . PROLAPSED UTERINE FIBROID LIGATION     in 1960  . TOOTH EXTRACTION  09/2017    Family History  Problem Relation Age of Onset  . Diabetes Mother   . Heart disease Mother        MI  . Uterine cancer Mother   . Stroke Father   . Cancer Paternal Grandmother        intestinal  . Stomach cancer Maternal Grandmother   . Colon cancer Neg Hx   . Breast cancer Neg Hx     Allergies  Allergen Reactions  . Albuterol Rash  . Cefdinir Diarrhea  . Lactose Intolerance (Gi) Nausea And Vomiting and Other (See Comments)    Patient has IBS  . Levofloxacin Nausea Only   . Other Nausea And Vomiting    No greasy foods!!  . Adhesive [Tape] Rash    Paper tape is tolerated  . Epinephrine Anxiety and Palpitations  . Latex Rash    Current Outpatient Medications on File Prior to Visit  Medication Sig Dispense Refill  . Calcium-Vitamin D-Vitamin K (CHEWABLE CALCIUM PO) Take by mouth  daily.    . Cholecalciferol (D3 MAXIMUM STRENGTH) 5000 units capsule Take 5,000 Units by mouth daily with lunch.     . gabapentin (NEURONTIN) 100 MG capsule TAKE ONE CAPSULE BY MOUTH AT BEDTIME 90 capsule 0  . hyoscyamine (LEVBID) 0.375 MG 12 hr tablet Take 1 tablet (0.375 mg total) by mouth daily. Take one tablet each morning 30 tablet 11  . Lacosamide (VIMPAT) 100 MG TABS Take 1 tablet (100 mg total) by mouth 2 (two) times daily. 180 each 4  . Liniments (BLUE-EMU SUPER STRENGTH) CREA Apply 1 application topically 3 (three) times daily as needed (for knee and back pain).     . meclizine (ANTIVERT) 25 MG tablet Take 1 tablet (25 mg total) by mouth 3 (three) times daily as needed for dizziness (vertigo). Reported on 05/13/2015 30 tablet 0  . OXcarbazepine (TRILEPTAL) 150 MG tablet Take 1 tablet (150 mg total) by mouth 2 (two) times daily. 180 tablet 4  . pantoprazole (PROTONIX) 20 MG tablet Take 1 tablet (20 mg total) by mouth 2 (two) times daily. 180 tablet 3  . Probiotic Product (PROBIOTIC PO) Take 1 capsule by mouth daily.    . rosuvastatin (CRESTOR) 5 MG tablet Take on Monday, Wednesday, Friday at bedtime for cholesterol. 36 tablet 3   No current facility-administered medications on file prior to visit.     BP 124/84   Pulse 78   Temp 97.6 F (36.4 C) (Temporal)   Ht 4\' 11"  (1.499 m)   Wt 156 lb 8 oz (71 kg)   SpO2 98%   BMI 31.61 kg/m    Objective:   Physical Exam  Constitutional: She is oriented to person, place, and time. She appears well-nourished.  Eyes: EOM are normal.  Neck: Neck supple.  Cardiovascular: Normal rate and regular rhythm.  Respiratory: Effort  normal and breath sounds normal.  Neurological: She is alert and oriented to person, place, and time. No cranial nerve deficit.  Skin: Skin is warm and dry.  Psychiatric: She has a normal mood and affect.           Assessment & Plan:

## 2018-12-31 NOTE — Assessment & Plan Note (Signed)
Doing well on gabapentin HS, continue same.  

## 2018-12-31 NOTE — Assessment & Plan Note (Signed)
Doing well on current regimen of pantoprazole, continue same.

## 2018-12-31 NOTE — Assessment & Plan Note (Signed)
Compliant to calcium and vitamin D. Declines repeat bone density scan today.

## 2019-01-01 LAB — CBC
HCT: 38.1 % (ref 36.0–46.0)
Hemoglobin: 12.7 g/dL (ref 12.0–15.0)
MCHC: 33.3 g/dL (ref 30.0–36.0)
MCV: 93.9 fl (ref 78.0–100.0)
Platelets: 179 10*3/uL (ref 150.0–400.0)
RBC: 4.06 Mil/uL (ref 3.87–5.11)
RDW: 13.4 % (ref 11.5–15.5)
WBC: 7.8 10*3/uL (ref 4.0–10.5)

## 2019-01-01 LAB — TSH: TSH: 1.62 u[IU]/mL (ref 0.35–4.50)

## 2019-01-01 LAB — COMPREHENSIVE METABOLIC PANEL
ALT: 8 U/L (ref 0–35)
AST: 14 U/L (ref 0–37)
Albumin: 4.2 g/dL (ref 3.5–5.2)
Alkaline Phosphatase: 61 U/L (ref 39–117)
BUN: 10 mg/dL (ref 6–23)
CO2: 29 mEq/L (ref 19–32)
Calcium: 9.2 mg/dL (ref 8.4–10.5)
Chloride: 105 mEq/L (ref 96–112)
Creatinine, Ser: 0.82 mg/dL (ref 0.40–1.20)
GFR: 66.15 mL/min (ref >60.00)
Glucose, Bld: 87 mg/dL (ref 70–99)
Potassium: 4.3 mEq/L (ref 3.5–5.1)
Sodium: 142 mEq/L (ref 135–145)
Total Bilirubin: 0.5 mg/dL (ref 0.2–1.2)
Total Protein: 6.9 g/dL (ref 6.0–8.3)

## 2019-01-01 LAB — LIPID PANEL
Cholesterol: 138 mg/dL (ref 0–200)
HDL: 45.3 mg/dL (ref >39.00)
LDL Cholesterol: 65 mg/dL (ref 0–99)
NonHDL: 92.6
Total CHOL/HDL Ratio: 3
Triglycerides: 138 mg/dL (ref 0.0–149.0)
VLDL: 27.6 mg/dL (ref 0.0–40.0)

## 2019-01-03 ENCOUNTER — Other Ambulatory Visit: Payer: Self-pay | Admitting: Primary Care

## 2019-01-03 DIAGNOSIS — E785 Hyperlipidemia, unspecified: Secondary | ICD-10-CM

## 2019-01-03 DIAGNOSIS — M5430 Sciatica, unspecified side: Secondary | ICD-10-CM

## 2019-01-06 ENCOUNTER — Other Ambulatory Visit: Payer: Self-pay | Admitting: Primary Care

## 2019-01-06 DIAGNOSIS — M5430 Sciatica, unspecified side: Secondary | ICD-10-CM

## 2019-01-06 NOTE — Telephone Encounter (Signed)
Electronic refill request gabapentin Last refill 01/03/19 #30 Last office visit 12/31/18

## 2019-01-06 NOTE — Telephone Encounter (Signed)
Vallarie Mare, did we refill this already?

## 2019-01-07 NOTE — Telephone Encounter (Signed)
It looks like the refill on 01/03/19 was printed. Spoke to pharmacist and was advised that they have not gotten a script/refill since August.

## 2019-01-08 NOTE — Telephone Encounter (Signed)
Will refax printed Rx

## 2019-01-10 ENCOUNTER — Other Ambulatory Visit: Payer: Self-pay

## 2019-01-10 ENCOUNTER — Encounter: Payer: Self-pay | Admitting: Cardiology

## 2019-01-10 ENCOUNTER — Ambulatory Visit (INDEPENDENT_AMBULATORY_CARE_PROVIDER_SITE_OTHER): Payer: Medicare Other | Admitting: Cardiology

## 2019-01-10 VITALS — BP 120/70 | HR 76 | Ht 59.0 in | Wt 157.5 lb

## 2019-01-10 DIAGNOSIS — R55 Syncope and collapse: Secondary | ICD-10-CM

## 2019-01-10 NOTE — Patient Instructions (Signed)
Medication Instructions:  - Your physician recommends that you continue on your current medications as directed. Please refer to the Current Medication list given to you today.  *If you need a refill on your cardiac medications before your next appointment, please call your pharmacy*  Lab Work: - none ordered  If you have labs (blood work) drawn today and your tests are completely normal, you will receive your results only by: Marland Kitchen MyChart Message (if you have MyChart) OR . A paper copy in the mail If you have any lab test that is abnormal or we need to change your treatment, we will call you to review the results.  Testing/Procedures: - none ordered  Follow-Up: At [facility], you and your health needs are our priority.  As part of our continuing mission to provide you with exceptional heart care, we have created designated Provider Care Teams.  These Care Teams include your primary Cardiologist (physician) and Advanced Practice Providers (APPs -  Physician Assistants and Nurse Practitioners) who all work together to provide you with the care you need, when you need it.  Your next appointment:   As needed   The format for your next appointment:   n/a  Provider:   n/a  Other Instructions n/a

## 2019-01-10 NOTE — Progress Notes (Signed)
Cardiology Office Note:    Date:  01/10/2019   ID:  Shari Vincent, DOB 1931-10-16, MRN [id number]  PCP:  Pleas Koch, NP  Cardiologist:  Kate Sable, MD  Electrophysiologist:  None   Referring MD: Pleas Koch, NP   Chief Complaint  Patient presents with  . other    Ref by [name], NP for syncope. Meds reviewed by the pt. verbally. Pt. c/o having several spells of syncope since Jan. 2020. The most recent spell of syncope was 3 weeks ago.     History of Present Illness:    Shari Vincent is a 85 y.o. female with a hx of GERD, IBS, syncope who is being seen due to syncopal event.  Patient in the room with son.  About 3 weeks ago, patient states having a funny feeling while having a bowel movement.  She suddenly passed out does not remember what happened during the event.  Her husband heard her fall, found her on the floor.  EMS was called and patient taken to the hospital in [facility].  Patient denies any chest pain, shortness of breath, palpitations prior to this event.  Work-up in the hospital was unrevealing and patient was discharged home.  Patient states having another event in January 2020.  In January, patient was having a bowel movement and then suddenly passed out.  She presented to [facility] where echocardiogram was normal, carotid ultrasound did not reveal any significant stenosis.  She states having episodes of IBS attacks with loose stools during both syncopal events.  She walks around her home and sometimes works in the yard without any symptoms of chest pain or shortness of breath.  She denies any history of cardiac disease.  Orthostatic vital signs in the office today were negative for orthostasis.  Past Medical History:  Diagnosis Date  . Acid reflux   . Acute cerebrovascul insuff, transient focal neurologic signs/symptoms 06/27/2016  . Arthritis    right knee  . Bulging lumbar disc   . Cancer ([place])    Basal Cell on right  shoulder  . Colon polyps   . Diverticulitis   . Fall 02/2018  . High cholesterol   . Irritable bowel syndrome (IBS)   . Kidney stone   . Knee pain    right  . Sciatic pain   . Stroke ([place]) 09/2017  . Subarachnoid hemorrhage ([place])   . Trigeminal neuralgia   . Trigeminal neuralgia of right side of face   . Trigeminal neuralgia syndrome   . Vertigo     Past Surgical History:  Procedure Laterality Date  . BREAST BIOPSY Left   . BREAST SURGERY     biopsy in 1990   . CATARACT EXTRACTION    . COLONOSCOPY  05/27/2008   [id number] rectal polyp,s/p bx/sigmoid diverticula. hyperplastic polyps. benign random bx  . ESOPHAGOGASTRODUODENOSCOPY  05/27/2008   [id number] HH  . EYE SURGERY    . FOOT SURGERY     Left foot, metal plate  . HYSTEROSCOPY W/D&C N/A 09/03/2014   Procedure: DILATATION AND CURETTAGE /HYSTEROSCOPY;  Surgeon: Donnamae Jude, MD;  Location: [facility];  Service: Gynecology;  Laterality: N/A;  Requesting 09/03/14 @ 2:00p  . HYSTEROSCOPY W/D&C N/A 08/19/2015   Procedure: DILATATION AND CURETTAGE /HYSTEROSCOPY;  Surgeon: Donnamae Jude, MD;  Location: [facility];  Service: Gynecology;  Laterality: N/A;  . LITHOTRIPSY    . OTHER SURGICAL HISTORY     Uterine Prolapse/Tacking  . PROLAPSED UTERINE  FIBROID LIGATION     in 1960  . TOOTH EXTRACTION  09/2017    Current Medications: Current Meds  Medication Sig  . Calcium-Vitamin D-Vitamin K (CHEWABLE CALCIUM PO) Take by mouth daily.  . Cholecalciferol (D3 MAXIMUM STRENGTH) 5000 units capsule Take 5,000 Units by mouth daily with lunch.   . gabapentin (NEURONTIN) 100 MG capsule TAKE ONE CAPSULE BY MOUTH AT BEDTIME  . hyoscyamine (LEVBID) 0.375 MG 12 hr tablet Take 1 tablet (0.375 mg total) by mouth daily. Take one tablet each morning  . Lacosamide (VIMPAT) 100 MG TABS Take 1 tablet (100 mg total) by mouth 2 (two) times daily.  . Liniments (BLUE-EMU SUPER STRENGTH) CREA Apply 1 application topically 3 (three) times daily as  needed (for knee and back pain).   . meclizine (ANTIVERT) 25 MG tablet Take 1 tablet (25 mg total) by mouth 3 (three) times daily as needed for dizziness (vertigo). Reported on 05/13/2015  . OXcarbazepine (TRILEPTAL) 150 MG tablet Take 1 tablet (150 mg total) by mouth 2 (two) times daily.  . pantoprazole (PROTONIX) 20 MG tablet Take 1 tablet (20 mg total) by mouth 2 (two) times daily.  . Probiotic Product (PROBIOTIC PO) Take 1 capsule by mouth daily.  . rosuvastatin (CRESTOR) 5 MG tablet TAKE ONE TABLET BY MOUTH ON MONDAY, WEDNESDAY AND FRIDAY AT BEDTIME FOR CHOLESTEROL     Allergies:   Albuterol, Cefdinir, Lactose intolerance (gi), Levofloxacin, Other, Adhesive [tape], Epinephrine, and Latex   Social History   Socioeconomic History  . Marital status: Married    Spouse name: Not on file  . Number of children: 3  . Years of education: 13  . Highest education level: Not on file  Occupational History  . Not on file  Social Needs  . Financial resource strain: Not on file  . Food insecurity    Worry: Not on file    Inability: Not on file  . Transportation needs    Medical: Not on file    Non-medical: Not on file  Tobacco Use  . Smoking status: Former Smoker    Types: Cigarettes  . Smokeless tobacco: Never Used  Substance and Sexual Activity  . Alcohol use: No  . Drug use: No  . Sexual activity: Not Currently    Birth control/protection: Post-menopausal  Lifestyle  . Physical activity    Days per week: Not on file    Minutes per session: Not on file  . Stress: Not on file  Relationships  . Social Herbalist on phone: Not on file    Gets together: Not on file    Attends religious service: Not on file    Active member of club or organization: Not on file    Attends meetings of clubs or organizations: Not on file    Relationship status: Not on file  Other Topics Concern  . Not on file  Social History Narrative   Married 62 years.   Has three children, six  grandchildren, 5 great grandchildren    Enjoys going to church, baking, shop.   Caffeine: occasional tea when eating out   Right-handed   Lives at home with husband. Has a caregiver during the week for 4 hours a day (not overnight).     Family History: The patient's family history includes Cancer in her paternal grandmother; Diabetes in her mother; Heart disease in her mother; Stomach cancer in her maternal grandmother; Stroke in her father; Uterine cancer in her mother. There is no  history of Colon cancer or Breast cancer.  ROS:   Please see the history of present illness.     All other systems reviewed and are negative.  EKGs/Labs/Other Studies Reviewed:    The following studies were reviewed today:  TTE 03/08/2018 Left ventricle: The cavity size was normal. Wall thickness was   normal. Systolic function was normal. The estimated ejection   fraction was in the range of 60% to 65%. Wall motion was normal;   there were no regional wall motion abnormalities. Doppler   parameters are consistent with abnormal left ventricular   relaxation (grade 1 diastolic dysfunction). - Aortic valve: Mildly calcified annulus. Trileaflet; moderately   calcified leaflets. There was moderate stenosis. Mean gradient   (S): 10 mm Hg. Peak gradient (S): 18 mm Hg. VTI ratio of LVOT to   aortic valve: 0.48. Valve area (VTI): 1.35 cm^2. Valve area   (Vmax): 1.37 cm^2. Valve area (Vmean): 1.35 cm^2. - Mitral valve: Mildly calcified annulus. There was mild   regurgitation. - Left atrium: The atrium was at the upper limits of normal in   size. - Right atrium: Central venous pressure (est): 3 mm Hg. - Atrial septum: No defect or patent foramen ovale was identified. - Tricuspid valve: There was trivial regurgitation. - Pulmonary arteries: PA peak pressure: 30 mm Hg (S). - Pericardium, extracardiac: There was no pericardial effusion.  US carotid bilateral 03/08/2018 IMPRESSION: 1. Mild (1-49%) stenosis  proximal right internal carotid artery secondary to mild focal echogenic/calcified atherosclerotic plaque. 2. Mild (1-49%) stenosis proximal left internal carotid artery secondary to mild focal echogenic/calcified atherosclerotic plaque. 3. Vertebral arteries are patent with normal antegrade flow.   EKG:  EKG is  ordered today.  The ekg ordered today demonstrates normal sinus rhythm, possible left atrial enlargement, left bundle branch block.  Recent Labs: 03/07/2018: B Natriuretic Peptide 55.0; Magnesium 2.0 12/31/2018: ALT 8; BUN 10; Creatinine, Ser 0.82; Hemoglobin 12.7; Platelets 179.0; Potassium 4.3; Sodium 142; TSH 1.62  Recent Lipid Panel    Component Value Date/Time   CHOL 138 12/31/2018 1331   TRIG 138.0 12/31/2018 1331   HDL 45.30 12/31/2018 1331   CHOLHDL 3 12/31/2018 1331   VLDL 27.6 12/31/2018 1331   LDLCALC 65 12/31/2018 1331   LDLDIRECT 29.0 11/26/2017 1116    Physical Exam:    VS:  BP 120/70 (BP Location: Right Arm, Patient Position: Sitting, Cuff Size: Normal)   Pulse 76   Ht 4\' 11"  (1.499 m)   Wt 157 lb 8 oz (71.4 kg)   SpO2 97%   BMI 31.81 kg/m     Wt Readings from Last 3 Encounters:  01/10/19 157 lb 8 oz (71.4 kg)  12/31/18 156 lb 8 oz (71 kg)  03/19/18 153 lb 12.8 oz (69.8 kg)     GEN:  Well nourished, well developed in no acute distress HEENT: Normal NECK: No JVD; No carotid bruits LYMPHATICS: No lymphadenopathy CARDIAC: RRR, no murmurs, rubs, gallops RESPIRATORY:  Clear to auscultation without rales, wheezing or rhonchi  ABDOMEN: Soft, non-tender, non-distended MUSCULOSKELETAL:  No edema; No deformity  SKIN: Warm and dry NEUROLOGIC:  Alert and oriented x 3 PSYCHIATRIC:  Normal affect   ASSESSMENT:   Patient with signs and symptoms consistent with vasovagal syncope occurring while having bowel movements likely secondary to IBS attack..  Echocardiogram in January showed normal ejection fraction, relaxation abnormality, no gross structural  abnormalities.  Orthostatic vitals in the emergency room did not reveal any evidence for orthostasis. 1. Vasovagal syncope  PLAN:    In order of problems listed above:  1. Patient symptoms consistent with vasovagal event.  No further cardiac testing indicated at this point.  Patient counseled to alert family if and when she is having an IBS event with abdominal cramps to she can be monitored.  Total encounter time more than 45 minutes  Greater than 50% was spent in counseling and coordination of care with the patient  This note was generated in part or whole with voice recognition software. Voice recognition is usually quite accurate but there are transcription errors that can and very often do occur. I apologize for any typographical errors that were not detected and corrected.  Low up as needed.  Medication Adjustments/Labs and Tests Ordered: Current medicines are reviewed at length with the patient today.  Concerns regarding medicines are outlined above.  Orders Placed This Encounter  Procedures  . EKG 12-Lead   No orders of the defined types were placed in this encounter.   Patient Instructions  Medication Instructions:  - Your physician recommends that you continue on your current medications as directed. Please refer to the Current Medication list given to you today.  *If you need a refill on your cardiac medications before your next appointment, please call your pharmacy*  Lab Work: - none ordered  If you have labs (blood work) drawn today and your tests are completely normal, you will receive your results only by: Marland Kitchen MyChart Message (if you have MyChart) OR . A paper copy in the mail If you have any lab test that is abnormal or we need to change your treatment, we will call you to review the results.  Testing/Procedures: - none ordered  Follow-Up: At [facility], you and your health needs are our priority.  As part of our continuing mission to provide you with  exceptional heart care, we have created designated Provider Care Teams.  These Care Teams include your primary Cardiologist (physician) and Advanced Practice Providers (APPs -  Physician Assistants and Nurse Practitioners) who all work together to provide you with the care you need, when you need it.  Your next appointment:   As needed   The format for your next appointment:   n/a  Provider:   n/a  Other Instructions n/a     Signed, Kate Sable, MD  01/10/2019 11:35 AM    [place]

## 2019-02-04 ENCOUNTER — Other Ambulatory Visit: Payer: Self-pay | Admitting: Neurology

## 2019-02-04 ENCOUNTER — Other Ambulatory Visit: Payer: Self-pay | Admitting: Primary Care

## 2019-02-04 DIAGNOSIS — M5430 Sciatica, unspecified side: Secondary | ICD-10-CM

## 2019-02-06 ENCOUNTER — Telehealth: Payer: Self-pay

## 2019-02-06 NOTE — Telephone Encounter (Signed)
Faxed the Rx this morning

## 2019-02-06 NOTE — Telephone Encounter (Signed)
Shari Vincent with [facility] in [place] left v/m that she packs pts meds and she has not gotten refill on gabapentin and cannot send out pts med packs. Request refill gabapentin.

## 2019-02-07 ENCOUNTER — Other Ambulatory Visit: Payer: Self-pay | Admitting: Primary Care

## 2019-02-07 ENCOUNTER — Other Ambulatory Visit (INDEPENDENT_AMBULATORY_CARE_PROVIDER_SITE_OTHER): Payer: Medicare Other

## 2019-02-07 DIAGNOSIS — R410 Disorientation, unspecified: Secondary | ICD-10-CM

## 2019-02-07 LAB — POC URINALSYSI DIPSTICK (AUTOMATED)
Bilirubin, UA: NEGATIVE
Blood, UA: NEGATIVE
Glucose, UA: NEGATIVE
Ketones, UA: NEGATIVE
Leukocytes, UA: NEGATIVE
Nitrite, UA: NEGATIVE
Protein, UA: NEGATIVE
Spec Grav, UA: 1.01 (ref 1.010–1.025)
Urobilinogen, UA: 0.2 E.U./dL
pH, UA: 7.5 (ref 5.0–8.0)

## 2019-02-07 NOTE — Telephone Encounter (Signed)
I spoke with patient's son via phone today and from what he describes she may be having bouts of dementia, possibly sun downing. Given her history of seizure/stroke I will forward patient's message to Neurologist for input.  Dr. Jaynee Eagles, do you have any thoughts or suggestions?  Thank you! Allie Bossier, NP-C

## 2019-02-07 NOTE — Telephone Encounter (Signed)
Glendell Docker, patient's son, called this morning also about this. He wanted to schedule a virtual visit to talk to [name] this morning. There are no openings. Glendell Docker was not sure how to proceed at this point that he needed to discuss this today. Please let Glendell Docker know with a call back to his number at [phone] as soon as possible.

## 2019-02-09 LAB — URINE CULTURE
MICRO NUMBER:: 1212884
SPECIMEN QUALITY:: ADEQUATE

## 2019-02-09 NOTE — Telephone Encounter (Addendum)
Thank you.  I saw this patient 6 weeks ago, had a good work up of which was overall unremarkable. I also sent her to cardiology in November 2020 who also evaluated her for these vasovagal symptoms. Notes in Epic. ECG completed in November 2020.  Will contact patient's son to have him bring her back in for repeat work up.

## 2019-02-10 ENCOUNTER — Other Ambulatory Visit: Payer: Self-pay | Admitting: Primary Care

## 2019-02-10 DIAGNOSIS — N3000 Acute cystitis without hematuria: Secondary | ICD-10-CM

## 2019-02-10 MED ORDER — SULFAMETHOXAZOLE-TRIMETHOPRIM 800-160 MG PO TABS: 1.0000 | ORAL_TABLET | Freq: Two times a day (BID) | ORAL | 0 refills | Status: DC

## 2019-02-10 NOTE — Telephone Encounter (Signed)
I spoke with patient's son as requested and continued to recommend ED evaluation for symptoms, he verbalized understanding and doesn't want to go to the ED due to potential long wait times. He will bring her into our office tomorrow at 11:20 for a visit. I stressed importance of ED evaluation if she had another vasovagal episode or if symptoms progressed. He verbalized understanding. It is still my recommendation that she go to the ED, he did understand my recommendation but continued to refuse.

## 2019-02-11 ENCOUNTER — Other Ambulatory Visit: Payer: Self-pay

## 2019-02-11 ENCOUNTER — Encounter: Payer: Self-pay | Admitting: Primary Care

## 2019-02-11 ENCOUNTER — Ambulatory Visit (INDEPENDENT_AMBULATORY_CARE_PROVIDER_SITE_OTHER): Payer: Medicare Other | Admitting: Primary Care

## 2019-02-11 DIAGNOSIS — K580 Irritable bowel syndrome with diarrhea: Secondary | ICD-10-CM

## 2019-02-11 DIAGNOSIS — R55 Syncope and collapse: Secondary | ICD-10-CM

## 2019-02-11 DIAGNOSIS — R413 Other amnesia: Secondary | ICD-10-CM | POA: Diagnosis not present

## 2019-02-11 DIAGNOSIS — N3000 Acute cystitis without hematuria: Secondary | ICD-10-CM | POA: Diagnosis not present

## 2019-02-11 LAB — CBC WITH DIFFERENTIAL/PLATELET
Basophils Absolute: 0 10*3/uL (ref 0.0–0.1)
Basophils Relative: 0.4 % (ref 0.0–3.0)
Eosinophils Absolute: 0.1 10*3/uL (ref 0.0–0.7)
Eosinophils Relative: 1.4 % (ref 0.0–5.0)
HCT: 37.6 % (ref 36.0–46.0)
Hemoglobin: 12.3 g/dL (ref 12.0–15.0)
Lymphocytes Relative: 30.8 % (ref 12.0–46.0)
Lymphs Abs: 2.3 10*3/uL (ref 0.7–4.0)
MCHC: 32.8 g/dL (ref 30.0–36.0)
MCV: 94.8 fl (ref 78.0–100.0)
Monocytes Absolute: 0.5 10*3/uL (ref 0.1–1.0)
Monocytes Relative: 6.7 % (ref 3.0–12.0)
Neutro Abs: 4.5 10*3/uL (ref 1.4–7.7)
Neutrophils Relative %: 60.7 % (ref 43.0–77.0)
Platelets: 185 10*3/uL (ref 150.0–400.0)
RBC: 3.97 Mil/uL (ref 3.87–5.11)
RDW: 13.8 % (ref 11.5–15.5)
WBC: 7.4 10*3/uL (ref 4.0–10.5)

## 2019-02-11 LAB — BASIC METABOLIC PANEL
BUN: 10 mg/dL (ref 6–23)
CO2: 25 mEq/L (ref 19–32)
Calcium: 9.7 mg/dL (ref 8.4–10.5)
Chloride: 101 mEq/L (ref 96–112)
Creatinine, Ser: 1.16 mg/dL (ref 0.40–1.20)
GFR: 44.31 mL/min — ABNORMAL LOW (ref >60.00)
Glucose, Bld: 110 mg/dL — ABNORMAL HIGH (ref 70–99)
Potassium: 3.9 mEq/L (ref 3.5–5.1)
Sodium: 134 mEq/L — ABNORMAL LOW (ref 135–145)

## 2019-02-11 LAB — VITAMIN B12: Vitamin B-12: 226 pg/mL (ref 211–911)

## 2019-02-11 NOTE — Progress Notes (Signed)
Subjective:    Patient ID: Shari Vincent, female    DOB: [birth year], 85 y.o.   MRN: [id number]  HPI  Shari Vincent is a 85 year old female with a history of TIA, cerebral amyloid angiopathy, vasovagal syncope, IBS, subarachnoid hemorrhage, hyperlipidemia who presents today with her family for a chief complaint of syncope.  She was last evaluated in our office on 12/31/18 for hospital follow up after a vasovagal syncopal episode. She underwent numerous scans, found to have mild subarachnoid hemorrhage that did resolve within a few hours. During this visit her family endorsed a long history of vasovagal syncope over the years, determined to be correlated with IBS that improved (reduced frequency of episodes) after initiation of hyoscyamine per GI.   Given the information provided during her visit in November 2020 coupled with recent admission and long history of vasovagal syncope, we sent her to cardiology for evaluation. She was evaluated by cardiology on 01/10/19. She'd undergone cardiac testing earlier in 2020 during a hospital admission for syncope. During her visit with cardiology they determined cause to be vasovagal without further cardiac testing warranted. No cardiac cause for symptoms.   Her son sent [place] a message via My Chart last week with reports of acute confusion, weakness, recurrent vasovagal episodes (atypical). Given this information I ordered a UA with Culture.  We touched based with her neurologist who suspected more of an acute cause rather than dementia and recommended evaluation in our office. Her urine culture returned positive on 02/10/19 so antibiotic treatment was initiated on 02/10/19. Her son contacted [place] on 02/10/19 with reports of continued vasovagal episodes and acute confusion, given this we recommended ED evaluation and his family kindly refused. She is here today for evaluation.  Today the patient endorses she's feeling better. Her other son stayed with her last night,  no vasovagal episodes last night. Her family endorses she's "night and day" today compared to the last several days. They note that she's able to ambulate, complete ADL's with minor help. She is compliant to her Bactrim DS tablets as prescribed.   She does follow with GI and is taking hyoscyamine 0.375 once daily for which she's taken for years. Typically she doesn't struggle with diarrhea or GI upset except over the last 6 days. Her family denies a change in her diet. She denies nausea/vomiting. She and her family state that she doesn't drink much water typically.   Review of Systems  Constitutional: Negative for fever.  Respiratory: Negative for shortness of breath.   Cardiovascular: Negative for chest pain.  Gastrointestinal: Positive for diarrhea. Negative for nausea and vomiting.  Neurological: Positive for dizziness.  Psychiatric/Behavioral: Positive for confusion.       Past Medical History:  Diagnosis Date  . Acid reflux   . Acute cerebrovascul insuff, transient focal neurologic signs/symptoms 06/27/2016  . Arthritis    right knee  . Bulging lumbar disc   . Cancer ([place])    Basal Cell on right shoulder  . Colon polyps   . Diverticulitis   . Fall 02/2018  . High cholesterol   . Irritable bowel syndrome (IBS)   . Kidney stone   . Knee pain    right  . Sciatic pain   . Stroke ([place]) 09/2017  . Subarachnoid hemorrhage ([place])   . Trigeminal neuralgia   . Trigeminal neuralgia of right side of face   . Trigeminal neuralgia syndrome   . Vertigo      Social History   Socioeconomic  History  . Marital status: Married    Spouse name: Not on file  . Number of children: 3  . Years of education: 61  . Highest education level: Not on file  Occupational History  . Not on file  Tobacco Use  . Smoking status: Former Smoker    Types: Cigarettes  . Smokeless tobacco: Never Used  Substance and Sexual Activity  . Alcohol use: No  . Drug use: No  . Sexual activity: Not Currently      Birth control/protection: Post-menopausal  Other Topics Concern  . Not on file  Social History Narrative   Married 62 years.   Has three children, six grandchildren, 5 great grandchildren    Enjoys going to church, baking, shop.   Caffeine: occasional tea when eating out   Right-handed   Lives at home with husband. Has a caregiver during the week for 4 hours a day (not overnight).   Social Determinants of Health   Financial Resource Strain:   . Difficulty of Paying Living Expenses: Not on file  Food Insecurity:   . Worried About Charity fundraiser in the Last Year: Not on file  . Ran Out of Food in the Last Year: Not on file  Transportation Needs:   . Lack of Transportation (Medical): Not on file  . Lack of Transportation (Non-Medical): Not on file  Physical Activity:   . Days of Exercise per Week: Not on file  . Minutes of Exercise per Session: Not on file  Stress:   . Feeling of Stress : Not on file  Social Connections:   . Frequency of Communication with Friends and Family: Not on file  . Frequency of Social Gatherings with Friends and Family: Not on file  . Attends Religious Services: Not on file  . Active Member of Clubs or Organizations: Not on file  . Attends Archivist Meetings: Not on file  . Marital Status: Not on file  Intimate Partner Violence:   . Fear of Current or Ex-Partner: Not on file  . Emotionally Abused: Not on file  . Physically Abused: Not on file  . Sexually Abused: Not on file    Past Surgical History:  Procedure Laterality Date  . BREAST BIOPSY Left   . BREAST SURGERY     biopsy in 1990   . CATARACT EXTRACTION    . COLONOSCOPY  05/27/2008   [id number] rectal polyp,s/p bx/sigmoid diverticula. hyperplastic polyps. benign random bx  . ESOPHAGOGASTRODUODENOSCOPY  05/27/2008   [id number] [facility]  . EYE SURGERY    . FOOT SURGERY     Left foot, metal plate  . HYSTEROSCOPY WITH D & C N/A 09/03/2014   Procedure: DILATATION AND  CURETTAGE /HYSTEROSCOPY;  Surgeon: Donnamae Jude, MD;  Location: [facility];  Service: Gynecology;  Laterality: N/A;  Requesting 09/03/14 @ 2:00p  . HYSTEROSCOPY WITH D & C N/A 08/19/2015   Procedure: DILATATION AND CURETTAGE /HYSTEROSCOPY;  Surgeon: Donnamae Jude, MD;  Location: [facility];  Service: Gynecology;  Laterality: N/A;  . LITHOTRIPSY    . OTHER SURGICAL HISTORY     Uterine Prolapse/Tacking  . PROLAPSED UTERINE FIBROID LIGATION     in 1960  . TOOTH EXTRACTION  09/2017    Family History  Problem Relation Age of Onset  . Diabetes Mother   . Heart disease Mother        MI  . Uterine cancer Mother   . Stroke Father   . Cancer Paternal Grandmother  intestinal  . Stomach cancer Maternal Grandmother   . Colon cancer Neg Hx   . Breast cancer Neg Hx     Allergies  Allergen Reactions  . Albuterol Rash  . Cefdinir Diarrhea  . Lactose Intolerance (Gi) Nausea And Vomiting and Other (See Comments)    Patient has IBS  . Levofloxacin Nausea Only  . Other Nausea And Vomiting    No greasy foods!!  . Adhesive [Tape] Rash    Paper tape is tolerated  . Epinephrine Anxiety and Palpitations  . Latex Rash    Current Outpatient Medications on File Prior to Visit  Medication Sig Dispense Refill  . Calcium-Vitamin D-Vitamin K (CHEWABLE CALCIUM PO) Take by mouth daily.    . Cholecalciferol (D3 MAXIMUM STRENGTH) 5000 units capsule Take 5,000 Units by mouth daily with lunch.     . gabapentin (NEURONTIN) 100 MG capsule TAKE ONE CAPSULE BY MOUTH AT BEDTIME 90 capsule 1  . hyoscyamine (LEVBID) 0.375 MG 12 hr tablet Take 1 tablet (0.375 mg total) by mouth daily. Take one tablet each morning 30 tablet 11  . Liniments (BLUE-EMU SUPER STRENGTH) CREA Apply 1 application topically 3 (three) times daily as needed (for knee and back pain).     . meclizine (ANTIVERT) 25 MG tablet Take 1 tablet (25 mg total) by mouth 3 (three) times daily as needed for dizziness (vertigo). Reported on 05/13/2015 30 tablet  0  . OXcarbazepine (TRILEPTAL) 150 MG tablet Take 1 tablet (150 mg total) by mouth 2 (two) times daily. 180 tablet 4  . pantoprazole (PROTONIX) 20 MG tablet Take 1 tablet (20 mg total) by mouth 2 (two) times daily. 180 tablet 3  . Probiotic Product (PROBIOTIC PO) Take 1 capsule by mouth daily.    . rosuvastatin (CRESTOR) 5 MG tablet TAKE ONE TABLET BY MOUTH ON MONDAY, WEDNESDAY AND FRIDAY AT BEDTIME FOR CHOLESTEROL 12 tablet 1  . sulfamethoxazole-trimethoprim (BACTRIM DS) 800-160 MG tablet Take 1 tablet by mouth 2 (two) times daily. For urinary tract infection. 10 tablet 0  . VIMPAT 100 MG TABS TAKE 1 TABLET (100 MG TOTAL) BY MOUTH 2 (TWO) TIMES DAILY. 60 tablet 4   No current facility-administered medications on file prior to visit.    BP 120/72   Pulse 60   Temp (!) 96 F (35.6 C) (Temporal)   Ht 4\' 11"  (1.499 m)   Wt 152 lb 12 oz (69.3 kg)   SpO2 98%   BMI 30.85 kg/m    Objective:   Physical Exam  Constitutional: She is oriented to person, place, and time.  Cardiovascular: Normal rate and regular rhythm.  Respiratory: Effort normal and breath sounds normal.  GI: Soft. Bowel sounds are normal. There is no abdominal tenderness.  Musculoskeletal:     Cervical back: Neck supple.  Neurological: She is alert and oriented to person, place, and time.  Answers questions appropriately   Psychiatric: She has a normal mood and affect.           Assessment & Plan:

## 2019-02-11 NOTE — Assessment & Plan Note (Signed)
Chronic over the year, recent exacerbation likely secondary to acute cystitis. Today she seems alert and is answering questions appropriately.  Do suspect underlying dementia that is overall mild. Will notify neurology as FYI and for any further recommendations.

## 2019-02-11 NOTE — Assessment & Plan Note (Signed)
UA negative, culture with Proteus mirabilus, treated with Bactrim DS and improving.  Suspect acute confusion and recurrent vasovagal symptoms are moreso secondary to acute cystitis as she's improved since antibiotic treatment.  Check CBC and BMP today. Discussed the absolute need to stay hydrated with water. Continue to monitor.

## 2019-02-11 NOTE — Patient Instructions (Signed)
Stop by the lab prior to leaving today. I will notify you of your results once received.   Ensure you are consuming 64 ounces of water daily.  Continue taking the antibiotic for the urinary tract infection.  It was a pleasure to see you today!

## 2019-02-11 NOTE — Assessment & Plan Note (Signed)
Chronic with intermittent episodes of vasovagal syncope over the years, compliant to Levsin.   Will notify GI of recent recurrent episodes. She is managed on Levsin which can cause side effects of confusion, etc.

## 2019-02-11 NOTE — Assessment & Plan Note (Addendum)
More frequent over the last one week, likely exacerbated by acute cystitis. Symptoms have improved since treatment.   Cardiology evaluated and ruled out cardiac cause. Will notify GI and neurology as FYI.  Negative orthostatic vitals today.  CBC and BMP pending.

## 2019-02-19 ENCOUNTER — Encounter: Payer: Self-pay | Admitting: Family Medicine

## 2019-02-19 ENCOUNTER — Ambulatory Visit (INDEPENDENT_AMBULATORY_CARE_PROVIDER_SITE_OTHER): Payer: Medicare Other | Admitting: Family Medicine

## 2019-02-19 ENCOUNTER — Other Ambulatory Visit: Payer: Self-pay

## 2019-02-19 VITALS — BP 112/64 | HR 96 | Temp 98.1°F | Ht 59.0 in | Wt 156.8 lb

## 2019-02-19 DIAGNOSIS — R300 Dysuria: Secondary | ICD-10-CM | POA: Diagnosis not present

## 2019-02-19 LAB — POCT URINALYSIS DIPSTICK
Bilirubin, UA: NEGATIVE
Blood, UA: NEGATIVE
Glucose, UA: NEGATIVE
Ketones, UA: NEGATIVE
Leukocytes, UA: NEGATIVE
Nitrite, UA: NEGATIVE
Protein, UA: NEGATIVE
Spec Grav, UA: 1.015 (ref 1.010–1.025)
Urobilinogen, UA: 0.2 E.U./dL
pH, UA: 6 (ref 5.0–8.0)

## 2019-02-19 NOTE — Telephone Encounter (Signed)
Shari Vincent (DPR signed) said pt is having burning upon urination; from 02/07/19 lab result note on urine culture pt was to come into office to be seen if symptoms continued. Shari Vincent spoke with [name] who will be bringing pt to office. appt scheduled today at 11:45 with [name] FNP. Pt and [name] who will come back with pt has no covid symptoms, no travel and no known exposure. FYI to [name] FNP.

## 2019-02-19 NOTE — Patient Instructions (Signed)
Good to meet you today  I'll send you a message regarding your urine results

## 2019-02-19 NOTE — Progress Notes (Signed)
Subjective:    Patient ID: Shari Vincent, female    DOB: 12/27/1931, 85 y.o.   MRN: [id number]  HPI  Chief Complaint  Patient presents with  . Urinary Tract Infection    Burning with urination, U/A clear. Confusion is better. Still having the "feeling of needing to urinate" Completed Bactrim Rx on 02/14/2019    This is an 85 yo female, accompanied by her daughter, who presents today with above cc. She recently completed 5 day course of sulfamethoxazole/ trimethoprim on 02/14/19 for urine culture positive for Proteus mirabilis.  Prior to treatment, patient had increased confusion which has improved significantly with antibiotic treatment.  The patient reports that she is still having some burning with urination.  This is improved but has not completely resolved.  No increased back pain.  Some lower abdominal fullness.  No fever or chills.  Drinking 6 to 8 glasses of liquid a day.  Nocturia x1.  Past Medical History:  Diagnosis Date  . Acid reflux   . Acute cerebrovascul insuff, transient focal neurologic signs/symptoms 06/27/2016  . Arthritis    right knee  . Bulging lumbar disc   . Cancer ([place])    Basal Cell on right shoulder  . Colon polyps   . Concussion with brief loss of consciousness 03/08/2018  . Diverticulitis   . Fall 02/2018  . High cholesterol   . Irritable bowel syndrome (IBS)   . Kidney stone   . Knee pain    right  . Sciatic pain   . Stroke ([place]) 09/2017  . Subarachnoid hemorrhage ([place])   . Trigeminal neuralgia   . Trigeminal neuralgia of right side of face   . Trigeminal neuralgia syndrome   . Vertigo    Past Surgical History:  Procedure Laterality Date  . BREAST BIOPSY Left   . BREAST SURGERY     biopsy in 1990   . CATARACT EXTRACTION    . COLONOSCOPY  05/27/2008   [id number] rectal polyp,s/p bx/sigmoid diverticula. hyperplastic polyps. benign random bx  . ESOPHAGOGASTRODUODENOSCOPY  05/27/2008   [id number] [facility]  . EYE SURGERY    . FOOT  SURGERY     Left foot, metal plate  . HYSTEROSCOPY WITH D & C N/A 09/03/2014   Procedure: DILATATION AND CURETTAGE /HYSTEROSCOPY;  Surgeon: Donnamae Jude, MD;  Location: [facility];  Service: Gynecology;  Laterality: N/A;  Requesting 09/03/14 @ 2:00p  . HYSTEROSCOPY WITH D & C N/A 08/19/2015   Procedure: DILATATION AND CURETTAGE /HYSTEROSCOPY;  Surgeon: Donnamae Jude, MD;  Location: [facility];  Service: Gynecology;  Laterality: N/A;  . LITHOTRIPSY    . OTHER SURGICAL HISTORY     Uterine Prolapse/Tacking  . PROLAPSED UTERINE FIBROID LIGATION     in 1960  . TOOTH EXTRACTION  09/2017   Family History  Problem Relation Age of Onset  . Diabetes Mother   . Heart disease Mother        MI  . Uterine cancer Mother   . Stroke Father   . Cancer Paternal Grandmother        intestinal  . Stomach cancer Maternal Grandmother   . Colon cancer Neg Hx   . Breast cancer Neg Hx    Social History   Tobacco Use  . Smoking status: Former Smoker    Types: Cigarettes  . Smokeless tobacco: Never Used  Substance Use Topics  . Alcohol use: No  . Drug use: No      Review of Systems Per  HPI    Objective:   Physical Exam Vitals reviewed.  Constitutional:      General: She is not in acute distress.    Appearance: Normal appearance. She is obese. She is not ill-appearing, toxic-appearing or diaphoretic.  HENT:     Head: Normocephalic and atraumatic.  Eyes:     Conjunctiva/sclera: Conjunctivae normal.  Cardiovascular:     Rate and Rhythm: Normal rate and regular rhythm.     Heart sounds: Normal heart sounds.  Pulmonary:     Effort: Pulmonary effort is normal.     Breath sounds: Normal breath sounds.  Abdominal:     General: Abdomen is flat. There is no distension.     Tenderness: There is no abdominal tenderness. There is no right CVA tenderness, left CVA tenderness or guarding.  Musculoskeletal:     Right lower leg: Edema (trace) present.     Left lower leg: Edema (trace) present.  Skin:     General: Skin is warm and dry.  Neurological:     Mental Status: She is alert.     Comments: Alert and answers questions appropriately.  Psychiatric:        Mood and Affect: Mood normal.        Behavior: Behavior normal.        Thought Content: Thought content normal.        Judgment: Judgment normal.       BP 112/64 (BP Location: Left Arm, Patient Position: Sitting, Cuff Size: Normal)   Pulse 96   Temp 98.1 F (36.7 C) (Temporal)   Ht 4\' 11"  (1.499 m)   Wt 156 lb 12.8 oz (71.1 kg)   SpO2 97%   BMI 31.67 kg/m   Wt Readings from Last 3 Encounters:  02/19/19 156 lb 12.8 oz (71.1 kg)  02/11/19 152 lb 12 oz (69.3 kg)  01/10/19 157 lb 8 oz (71.4 kg)    Results for orders placed or performed in visit on 02/19/19  Urinalysis Dipstick  Result Value Ref Range   Color, UA light yellow    Clarity, UA clear    Glucose, UA Negative Negative   Bilirubin, UA neg    Ketones, UA neg    Spec Grav, UA 1.015 1.010 - 1.025   Blood, UA neg    pH, UA 6.0 5.0 - 8.0   Protein, UA Negative Negative   Urobilinogen, UA 0.2 0.2 or 1.0 E.U./dL   Nitrite, UA neg    Leukocytes, UA Negative Negative   Appearance     Odor         Assessment & Plan:  1. Burning with urination -Urinalysis unremarkable.  Will send for culture to check for clearing of prior infection -Continue good fluid intake - RTC/ER/UC precautions reviewed - Urinalysis Dipstick - Urine Culture  This visit occurred during the SARS-CoV-2 public health emergency.  Safety protocols were in place, including screening questions prior to the visit, additional usage of staff PPE, and extensive cleaning of exam room while observing appropriate contact time as indicated for disinfecting solutions.    Clarene Reamer, FNP-BC  [facility]  02/19/2019 1:10 PM

## 2019-02-19 NOTE — Telephone Encounter (Signed)
Noted  

## 2019-02-19 NOTE — Telephone Encounter (Signed)
Patient's daughter [name] left a voicemail stating that her mom is doing much better, but did complain about burning when she urinated last night. [name] is wondering if her mom needs a few more days of antibiotic or bring a urine sample checked? Left message for [name] to call back so more information can be gotten from her.

## 2019-02-20 ENCOUNTER — Telehealth: Payer: Self-pay | Admitting: Gastroenterology

## 2019-02-20 NOTE — Telephone Encounter (Signed)
Patient's PCP reached out with concerns about recent worsening vasovagal symptoms in setting of UTI but also with increase diarrhea.   Let's offer her face to face OR virtual OV with RMR or APP in the next couple of weeks.

## 2019-02-21 LAB — URINE CULTURE
MICRO NUMBER:: 1241211
Result:: NO GROWTH
SPECIMEN QUALITY:: ADEQUATE

## 2019-02-24 NOTE — Telephone Encounter (Signed)
Lmom, waiting on a return call.  

## 2019-02-24 NOTE — Telephone Encounter (Signed)
Spoke with Glendell Docker, pts son. He took down a couple dates for his mother to be seen and he's going to call back to schedule apt after he's discussed further with his family.

## 2019-02-24 NOTE — Telephone Encounter (Signed)
Lmom for pt to call us back. 

## 2019-03-06 ENCOUNTER — Other Ambulatory Visit: Payer: Self-pay | Admitting: Primary Care

## 2019-03-06 ENCOUNTER — Other Ambulatory Visit: Payer: Self-pay | Admitting: Internal Medicine

## 2019-03-06 DIAGNOSIS — E785 Hyperlipidemia, unspecified: Secondary | ICD-10-CM

## 2019-04-01 ENCOUNTER — Other Ambulatory Visit: Payer: Self-pay | Admitting: Internal Medicine

## 2019-04-25 DIAGNOSIS — R42 Dizziness and giddiness: Secondary | ICD-10-CM

## 2019-04-28 ENCOUNTER — Other Ambulatory Visit: Payer: Self-pay | Admitting: Primary Care

## 2019-04-28 DIAGNOSIS — M5430 Sciatica, unspecified side: Secondary | ICD-10-CM

## 2019-04-28 MED ORDER — MECLIZINE HCL 25 MG PO TABS: 25.0000 mg | ORAL_TABLET | Freq: Three times a day (TID) | ORAL | 0 refills | Status: DC | PRN

## 2019-04-28 NOTE — Telephone Encounter (Signed)
Last prescribed on 03/18/2018 . Last appointment on 02/19/2019 (acute) with Debbie. No future appointment

## 2019-04-29 NOTE — Telephone Encounter (Signed)
Last prescribed on 02/06/2019 . Last appointment on 02/19/2019 (acute) with Debbie. No future appointment

## 2019-04-30 NOTE — Telephone Encounter (Signed)
Noted, refill sent to pharmacy. 

## 2019-05-01 NOTE — Telephone Encounter (Signed)
[facility] Night - Client Nonclinical Telephone Record AccessNurse Client [facility] Night - Client Client Site [place] Physician [name] - NP Contact Type Call Who Is Calling Patient / Member / Family / Caregiver Caller Name [place] Phone Number [phone] only during 8-4 Patient Name Shari Vincent Patient DOB September 25, 1931 Call Type Message Only Information Provided Reason for Call Medication Question / Request Initial Comment Caller is needing a refill on her medication called Meclizine 25mg , please call into [facility] on [address] in [place], [place]. Additional Comment Disp. Time Disposition Final User 05/01/2019 7:48:58 AM General Information Provided Yes Silvano Rusk Call Closed By: Silvano Rusk Transaction Date/Time: 05/01/2019 7:44:18 AM (ET)

## 2019-05-01 NOTE — Telephone Encounter (Signed)
Spoken to [employer] and they stated that it was on hold. They will refill for patient now.  Spoken to Glendell Docker (patient's son) and he will pick up for patient later today

## 2019-05-23 ENCOUNTER — Other Ambulatory Visit: Payer: Self-pay | Admitting: Primary Care

## 2019-05-23 DIAGNOSIS — R42 Dizziness and giddiness: Secondary | ICD-10-CM

## 2019-05-28 NOTE — Progress Notes (Signed)
Referring Provider: Pleas Koch, NP Primary Care Physician:  Pleas Koch, NP  Primary GI: Dr. Gala Romney   Chief Complaint  Patient presents with  . Gastroesophageal Reflux    occ  . Irritable Bowel Syndrome    occ diarrhea    HPI:   Shari Vincent is an 86 y.o. female presenting today with a history of History of GERD and IBS. Intermittent loose stool. Followed by cardiology for vasovagal syncope. Daughter present with her today Shari Vincent).   Shari Vincent states patient is slowing down overall but still doing well for age. GERD controlled with pantoprazole 20 mg BID. Likes to eat chicken. No added salt but does eat pre-packaged meals. No dysphagia. Levbid once each morning. BM usually a few times a week. No significant constipation and will take colace if needed. Likes ice cream, but this will cause frequent stool and abdominal discomfort. UTI prior to Christmas and had syncopal episode that week.   Past Medical History:  Diagnosis Date  . Acid reflux   . Acute cerebrovascul insuff, transient focal neurologic signs/symptoms 06/27/2016  . Arthritis    right knee  . Bulging lumbar disc   . Cancer ([place])    Basal Cell on right shoulder  . Colon polyps   . Concussion with brief loss of consciousness 03/08/2018  . Diverticulitis   . Fall 02/2018  . High cholesterol   . Irritable bowel syndrome (IBS)   . Kidney stone   . Knee pain    right  . Sciatic pain   . Stroke ([place]) 09/2017  . Subarachnoid hemorrhage ([place])   . Trigeminal neuralgia   . Trigeminal neuralgia of right side of face   . Trigeminal neuralgia syndrome   . Vertigo     Past Surgical History:  Procedure Laterality Date  . BREAST BIOPSY Left   . BREAST SURGERY     biopsy in 1990   . CATARACT EXTRACTION    . COLONOSCOPY  05/27/2008   [id number] rectal polyp,s/p bx/sigmoid diverticula. hyperplastic polyps. benign random bx  . ESOPHAGOGASTRODUODENOSCOPY  05/27/2008   [id number] HH  . EYE  SURGERY    . FOOT SURGERY     Left foot, metal plate  . HYSTEROSCOPY WITH D & C N/A 09/03/2014   Procedure: DILATATION AND CURETTAGE /HYSTEROSCOPY;  Surgeon: Donnamae Jude, MD;  Location: [facility];  Service: Gynecology;  Laterality: N/A;  Requesting 09/03/14 @ 2:00p  . HYSTEROSCOPY WITH D & C N/A 08/19/2015   Procedure: DILATATION AND CURETTAGE /HYSTEROSCOPY;  Surgeon: Donnamae Jude, MD;  Location: [facility];  Service: Gynecology;  Laterality: N/A;  . LITHOTRIPSY    . OTHER SURGICAL HISTORY     Uterine Prolapse/Tacking  . PROLAPSED UTERINE FIBROID LIGATION     in 1960  . TOOTH EXTRACTION  09/2017    Current Outpatient Medications  Medication Sig Dispense Refill  . Calcium-Vitamin D-Vitamin K (CHEWABLE CALCIUM PO) Take by mouth daily.    . Cholecalciferol (D3 MAXIMUM STRENGTH) 5000 units capsule Take 5,000 Units by mouth daily with lunch.     . Cyanocobalamin (VITAMIN B-12 PO) Take by mouth daily.    Marland Kitchen gabapentin (NEURONTIN) 100 MG capsule Take 1 capsule (100 mg total) by mouth at bedtime. For back pain. 90 capsule 1  . hyoscyamine (LEVBID) 0.375 MG 12 hr tablet TAKE ONE TABLET BY MOUTH IN THE MORNING 31 tablet 8  . Liniments (BLUE-EMU SUPER STRENGTH) CREA Apply 1 application topically 3 (three)  times daily as needed (for knee and back pain).     . meclizine (ANTIVERT) 25 MG tablet TAKE ONE TABLET BY MOUTH THREE TIMES A DAY AS NEEDED FOR DIZZINESS (VERTIGO) 30 tablet 0  . OXcarbazepine (TRILEPTAL) 150 MG tablet Take 1 tablet (150 mg total) by mouth 2 (two) times daily. 180 tablet 4  . pantoprazole (PROTONIX) 20 MG tablet TAKE ONE TABLET BY MOUTH TWO TIMES A DAY 56 tablet 3  . Probiotic Product (PROBIOTIC PO) Take 1 capsule by mouth daily.    . rosuvastatin (CRESTOR) 5 MG tablet TAKE ONE TABLET BY MOUTH ON MONDAY, WEDNESDAY AND FRIDAY AT BEDTIME FOR CHOLESTEROL 13 tablet 2  . VIMPAT 100 MG TABS TAKE 1 TABLET (100 MG TOTAL) BY MOUTH 2 (TWO) TIMES DAILY. 60 tablet 4   No current  facility-administered medications for this visit.    Allergies as of 05/30/2019 - Review Complete 05/30/2019  Allergen Reaction Noted  . Albuterol Rash 05/24/2014  . Cefdinir Diarrhea 07/23/2012  . Lactose intolerance (gi) Nausea And Vomiting and Other (See Comments) 09/27/2017  . Levofloxacin Nausea Only 10/22/2014  . Other Nausea And Vomiting 09/27/2017  . Adhesive [tape] Rash 09/27/2017  . Epinephrine Anxiety and Palpitations 06/23/2016  . Latex Rash 08/09/2015    Family History  Problem Relation Age of Onset  . Diabetes Mother   . Heart disease Mother        MI  . Uterine cancer Mother   . Stroke Father   . Cancer Paternal Grandmother        intestinal  . Stomach cancer Maternal Grandmother   . Colon cancer Neg Hx   . Breast cancer Neg Hx     Social History   Socioeconomic History  . Marital status: Married    Spouse name: Not on file  . Number of children: 3  . Years of education: 38  . Highest education level: Not on file  Occupational History  . Not on file  Tobacco Use  . Smoking status: Former Smoker    Types: Cigarettes  . Smokeless tobacco: Never Used  Substance and Sexual Activity  . Alcohol use: No  . Drug use: No  . Sexual activity: Not Currently    Birth control/protection: Post-menopausal  Other Topics Concern  . Not on file  Social History Narrative   Married 62 years.   Has three children, six grandchildren, 5 great grandchildren    Enjoys going to church, baking, shop.   Caffeine: occasional tea when eating out   Right-handed   Lives at home with husband. Has a caregiver during the week for 4 hours a day (not overnight).   Social Determinants of Health   Financial Resource Strain:   . Difficulty of Paying Living Expenses:   Food Insecurity:   . Worried About Charity fundraiser in the Last Year:   . Arboriculturist in the Last Year:   Transportation Needs:   . Film/video editor (Medical):   Marland Kitchen Lack of Transportation  (Non-Medical):   Physical Activity:   . Days of Exercise per Week:   . Minutes of Exercise per Session:   Stress:   . Feeling of Stress :   Social Connections:   . Frequency of Communication with Friends and Family:   . Frequency of Social Gatherings with Friends and Family:   . Attends Religious Services:   . Active Member of Clubs or Organizations:   . Attends Archivist Meetings:   .  Marital Status:     Review of Systems: Gen: Denies fever, chills, anorexia. Denies fatigue, weakness, weight loss.  CV: see HPI Resp: Denies dyspnea at rest, cough, wheezing, coughing up blood, and pleurisy. GI: see HPI Derm: Denies rash, itching, dry skin Psych: Denies depression, anxiety, memory loss, confusion. No homicidal or suicidal ideation.  Heme: Denies bruising, bleeding, and enlarged lymph nodes.  Physical Exam: BP (!) 157/79   Pulse 66   Temp (!) 96.8 F (36 C) (Temporal)   Ht 4\' 11"  (1.499 m)   Wt 160 lb 3.2 oz (72.7 kg)   BMI 32.36 kg/m  General:   Alert and oriented. No distress noted. Pleasant and cooperative.  Head:  Normocephalic and atraumatic. Eyes:  Conjuctiva clear without scleral icterus. Mouth:  Mask in place Abdomen:  +BS, soft, non-tender and non-distended.  Msk:  Symmetrical without gross deformities. Normal posture. Extremities:  With trace lower extremity edema, compression hose in place Neurologic:  Alert and  oriented x4 Psych:  Alert and cooperative. Normal mood and affect.  ASSESSMENT: TYLENE STAUNTON is an 86 y.o. female presenting today with history of GERD and IBS, vasovagal syncope, doing well overall and at baseline for symptoms.  GERD: without dysphagia. Continue pantoprazole 20 mg BID.  IBS: continue once daily dosing of Levbid, as this is working well for her and controlling symptoms overall. Discussed signs/symptoms to monitor.    PLAN:   Continue pantoprazole BID  Continue Levbid once daily: monitor for side effects  Refills  provided  Return in 1 year or sooner if needed   Annitta Needs, PhD, ANP-BC [facility]

## 2019-05-30 ENCOUNTER — Encounter: Payer: Self-pay | Admitting: Gastroenterology

## 2019-05-30 ENCOUNTER — Ambulatory Visit (INDEPENDENT_AMBULATORY_CARE_PROVIDER_SITE_OTHER): Payer: Medicare PPO | Admitting: Gastroenterology

## 2019-05-30 ENCOUNTER — Other Ambulatory Visit: Payer: Self-pay

## 2019-05-30 VITALS — BP 157/79 | HR 66 | Temp 96.8°F | Ht 59.0 in | Wt 160.2 lb

## 2019-05-30 DIAGNOSIS — K219 Gastro-esophageal reflux disease without esophagitis: Secondary | ICD-10-CM

## 2019-05-30 DIAGNOSIS — K589 Irritable bowel syndrome without diarrhea: Secondary | ICD-10-CM

## 2019-05-30 MED ORDER — PANTOPRAZOLE SODIUM 20 MG PO TBEC: 20.0000 mg | DELAYED_RELEASE_TABLET | Freq: Two times a day (BID) | ORAL | 3 refills | Status: AC

## 2019-05-30 MED ORDER — HYOSCYAMINE SULFATE ER 0.375 MG PO TB12: 0.3750 mg | ORAL_TABLET | Freq: Every morning | ORAL | 3 refills | Status: DC

## 2019-05-30 NOTE — Patient Instructions (Signed)
I have refilled Protonix and Levbid for you.  We will see you in 1 year or sooner if needed!  It was a pleasure to see you today. I want to create trusting relationships with patients to provide genuine, compassionate, and quality care. I value your feedback. If you receive a survey regarding your visit,  I greatly appreciate you taking time to fill this out.   Annitta Needs, PhD, ANP-BC [facility]

## 2019-06-02 NOTE — Progress Notes (Signed)
CC'ED TO PCP 

## 2019-06-10 NOTE — Telephone Encounter (Signed)
[name] called requesting cb about pt today; [name] said to her knowledge pt has not seen blood in stool today. Gave note to [facility] CMA and she will let Gentry Fitz NP know. [name] voiced understanding.

## 2019-06-16 ENCOUNTER — Other Ambulatory Visit: Payer: Self-pay

## 2019-06-16 ENCOUNTER — Ambulatory Visit: Payer: Medicare PPO | Admitting: Primary Care

## 2019-06-16 ENCOUNTER — Encounter: Payer: Self-pay | Admitting: Primary Care

## 2019-06-16 VITALS — BP 126/80 | HR 72 | Temp 96.8°F | Ht 59.0 in | Wt 157.2 lb

## 2019-06-16 DIAGNOSIS — R413 Other amnesia: Secondary | ICD-10-CM | POA: Diagnosis not present

## 2019-06-16 DIAGNOSIS — E538 Deficiency of other specified B group vitamins: Secondary | ICD-10-CM | POA: Diagnosis not present

## 2019-06-16 DIAGNOSIS — R32 Unspecified urinary incontinence: Secondary | ICD-10-CM

## 2019-06-16 DIAGNOSIS — N3000 Acute cystitis without hematuria: Secondary | ICD-10-CM

## 2019-06-16 LAB — POC URINALSYSI DIPSTICK (AUTOMATED)
Bilirubin, UA: NEGATIVE
Blood, UA: NEGATIVE
Glucose, UA: NEGATIVE
Ketones, UA: NEGATIVE
Leukocytes, UA: NEGATIVE
Nitrite, UA: NEGATIVE
Protein, UA: NEGATIVE
Spec Grav, UA: 1.01 (ref 1.010–1.025)
Urobilinogen, UA: 0.2 E.U./dL
pH, UA: 6 (ref 5.0–8.0)

## 2019-06-16 MED ORDER — OXYBUTYNIN CHLORIDE ER 5 MG PO TB24: 5.0000 mg | ORAL_TABLET | Freq: Every day | ORAL | 0 refills | Status: DC

## 2019-06-16 NOTE — Assessment & Plan Note (Signed)
Noted today with delayed responses.  Following with neurology.  Repeat UA with culture pending.

## 2019-06-16 NOTE — Patient Instructions (Signed)
Start oxybutynin XL 5 mg once daily for urinary incontinence.   Be sure to drink plenty of water during the day to stay hydrated.  I will be in touch once I receive your urine test results.  It was a pleasure to see you today!

## 2019-06-16 NOTE — Assessment & Plan Note (Signed)
Could be contributing to recurrent cystitis. Will trial low dose oxybutynin, she will update.

## 2019-06-16 NOTE — Assessment & Plan Note (Signed)
Diagnosed nearly 10 days ago at [facility] ED. Treated with Macrobid but then Cipro was sent to her pharmacy. Question whether the urine culture showed resistance with Macrobid.   Repeat UA today negative. Culture pending. Encouraged plenty of water intake.

## 2019-06-16 NOTE — Progress Notes (Signed)
Subjective:    Patient ID: Shari Vincent, female    DOB: 1931/03/25, 86 y.o.   MRN: [id number]  HPI  This visit occurred during the SARS-CoV-2 public health emergency.  Safety protocols were in place, including screening questions prior to the visit, additional usage of staff PPE, and extensive cleaning of exam room while observing appropriate contact time as indicated for disinfecting solutions.   Ms. Mccoll is a 86 year old female with a history of cerebral amyloid angiopathy, TIA, vasovagal syncope, IBS, trigeminal neuralgia, subarachnoid hemorrhage, cystitis who presents today for ED follow up.  She presented to [facility] ED on 06/07/19 with her family due to symptoms of syncope and fall. She was diagnosed with dehydration and urinary tract infection and was treated with Macrobid for 7 days. She underwent CT head and abdomen/pelvis which were negative for acute process.   She received a call from the hospital last week, was told that a prescription for Cipro was sent to the pharmacy, she never picked this up. She was not given a reason of why the Cipro was sent to the pharmacy.  She wears a pad in her underwear due to urinary incontinence, doesn't sit in saturated pad during the day, bathes 1-2 times weekly, does use wet wipes daily. She looses control of her urine during the day and evening.   She is visited by a health aid five days weekly from 10 am to 2 pm. Her children try to encourage her to drink water during the day. She is sedentary for most of her day.   Aside from the UTI, her daughter has noticed gradual changes in her memory and responsiveness. She plans to set up an appointment with neurology.  Review of Systems  Constitutional: Negative for fever.  Gastrointestinal: Negative for nausea.  Genitourinary: Negative for dysuria, frequency and hematuria.       Urinary incontinence        Past Medical History:  Diagnosis Date  . Acid reflux   . Acute cerebrovascul  insuff, transient focal neurologic signs/symptoms 06/27/2016  . Arthritis    right knee  . Bulging lumbar disc   . Cancer ([place])    Basal Cell on right shoulder  . Colon polyps   . Concussion with brief loss of consciousness 03/08/2018  . Diverticulitis   . Fall 02/2018  . High cholesterol   . Irritable bowel syndrome (IBS)   . Kidney stone   . Knee pain    right  . Sciatic pain   . Stroke ([place]) 09/2017  . Subarachnoid hemorrhage ([place])   . Trigeminal neuralgia   . Trigeminal neuralgia of right side of face   . Trigeminal neuralgia syndrome   . Vertigo      Social History   Socioeconomic History  . Marital status: Married    Spouse name: Not on file  . Number of children: 3  . Years of education: 19  . Highest education level: Not on file  Occupational History  . Not on file  Tobacco Use  . Smoking status: Former Smoker    Types: Cigarettes  . Smokeless tobacco: Never Used  Substance and Sexual Activity  . Alcohol use: No  . Drug use: No  . Sexual activity: Not Currently    Birth control/protection: Post-menopausal  Other Topics Concern  . Not on file  Social History Narrative   Married 62 years.   Has three children, six grandchildren, 5 great grandchildren    Enjoys going to  church, baking, shop.   Caffeine: occasional tea when eating out   Right-handed   Lives at home with husband. Has a caregiver during the week for 4 hours a day (not overnight).   Social Determinants of Health   Financial Resource Strain:   . Difficulty of Paying Living Expenses:   Food Insecurity:   . Worried About Charity fundraiser in the Last Year:   . Arboriculturist in the Last Year:   Transportation Needs:   . Film/video editor (Medical):   Marland Kitchen Lack of Transportation (Non-Medical):   Physical Activity:   . Days of Exercise per Week:   . Minutes of Exercise per Session:   Stress:   . Feeling of Stress :   Social Connections:   . Frequency of Communication with Friends and  Family:   . Frequency of Social Gatherings with Friends and Family:   . Attends Religious Services:   . Active Member of Clubs or Organizations:   . Attends Archivist Meetings:   Marland Kitchen Marital Status:   Intimate Partner Violence:   . Fear of Current or Ex-Partner:   . Emotionally Abused:   Marland Kitchen Physically Abused:   . Sexually Abused:     Past Surgical History:  Procedure Laterality Date  . BREAST BIOPSY Left   . BREAST SURGERY     biopsy in 1990   . CATARACT EXTRACTION    . COLONOSCOPY  05/27/2008   [id number] rectal polyp,s/p bx/sigmoid diverticula. hyperplastic polyps. benign random bx  . ESOPHAGOGASTRODUODENOSCOPY  05/27/2008   [id number] [facility]  . EYE SURGERY    . FOOT SURGERY     Left foot, metal plate  . HYSTEROSCOPY WITH D & C N/A 09/03/2014   Procedure: DILATATION AND CURETTAGE /HYSTEROSCOPY;  Surgeon: Donnamae Jude, MD;  Location: [facility];  Service: Gynecology;  Laterality: N/A;  Requesting 09/03/14 @ 2:00p  . HYSTEROSCOPY WITH D & C N/A 08/19/2015   Procedure: DILATATION AND CURETTAGE /HYSTEROSCOPY;  Surgeon: Donnamae Jude, MD;  Location: [facility];  Service: Gynecology;  Laterality: N/A;  . LITHOTRIPSY    . OTHER SURGICAL HISTORY     Uterine Prolapse/Tacking  . PROLAPSED UTERINE FIBROID LIGATION     in 1960  . TOOTH EXTRACTION  09/2017    Family History  Problem Relation Age of Onset  . Diabetes Mother   . Heart disease Mother        MI  . Uterine cancer Mother   . Stroke Father   . Cancer Paternal Grandmother        intestinal  . Stomach cancer Maternal Grandmother   . Colon cancer Neg Hx   . Breast cancer Neg Hx     Allergies  Allergen Reactions  . Albuterol Rash  . Cefdinir Diarrhea  . Lactose Intolerance (Gi) Nausea And Vomiting and Other (See Comments)    Patient has IBS  . Levofloxacin Nausea Only  . Other Nausea And Vomiting    No greasy foods!!  . Adhesive [Tape] Rash    Paper tape is tolerated  . Epinephrine Anxiety and  Palpitations  . Latex Rash    Current Outpatient Medications on File Prior to Visit  Medication Sig Dispense Refill  . Calcium-Vitamin D-Vitamin K (CHEWABLE CALCIUM PO) Take by mouth daily.    . Cholecalciferol (D3 MAXIMUM STRENGTH) 5000 units capsule Take 5,000 Units by mouth daily with lunch.     . Cyanocobalamin (VITAMIN B-12 PO) Take by mouth daily.    Marland Kitchen  gabapentin (NEURONTIN) 100 MG capsule Take 1 capsule (100 mg total) by mouth at bedtime. For back pain. 90 capsule 1  . hyoscyamine (LEVBID) 0.375 MG 12 hr tablet Take 1 tablet (0.375 mg total) by mouth every morning. 90 tablet 3  . Liniments (BLUE-EMU SUPER STRENGTH) CREA Apply 1 application topically 3 (three) times daily as needed (for knee and back pain).     . meclizine (ANTIVERT) 25 MG tablet TAKE ONE TABLET BY MOUTH THREE TIMES A DAY AS NEEDED FOR DIZZINESS (VERTIGO) 30 tablet 0  . nitrofurantoin, macrocrystal-monohydrate, (MACROBID) 100 MG capsule Take 100 mg by mouth at bedtime.     . OXcarbazepine (TRILEPTAL) 150 MG tablet Take 1 tablet (150 mg total) by mouth 2 (two) times daily. 180 tablet 4  . pantoprazole (PROTONIX) 20 MG tablet Take 1 tablet (20 mg total) by mouth 2 (two) times daily. 180 tablet 3  . Probiotic Product (PROBIOTIC PO) Take 1 capsule by mouth daily.    . rosuvastatin (CRESTOR) 5 MG tablet TAKE ONE TABLET BY MOUTH ON MONDAY, WEDNESDAY AND FRIDAY AT BEDTIME FOR CHOLESTEROL 13 tablet 2  . VIMPAT 100 MG TABS TAKE 1 TABLET (100 MG TOTAL) BY MOUTH 2 (TWO) TIMES DAILY. 60 tablet 4   No current facility-administered medications on file prior to visit.    BP 126/80   Pulse 72   Temp (!) 96.8 F (36 C) (Temporal)   Ht 4\' 11"  (1.499 m)   Wt 157 lb 4 oz (71.3 kg)   SpO2 97%   BMI 31.76 kg/m    Objective:   Physical Exam  Constitutional: She is oriented to person, place, and time. She appears well-nourished.  Cardiovascular: Normal rate and regular rhythm.  Respiratory: Effort normal and breath sounds normal.   Neurological: She is alert and oriented to person, place, and time.  Answers questions appropriately but is delayed with responses.            Assessment & Plan:

## 2019-06-17 LAB — VITAMIN B12: Vitamin B-12: >1500 pg/mL — ABNORMAL HIGH (ref 211–911)

## 2019-06-18 LAB — URINE CULTURE
MICRO NUMBER:: 10405466
SPECIMEN QUALITY:: ADEQUATE

## 2019-06-27 ENCOUNTER — Telehealth: Payer: Self-pay

## 2019-06-27 DIAGNOSIS — R42 Dizziness and giddiness: Secondary | ICD-10-CM

## 2019-06-27 NOTE — Telephone Encounter (Signed)
Received fax from [place] stating that patient told the pharmacist that Meclizine 25 mg was changed to 5 mg to take daily. After reviewing recent notes 06/16/19 I see where it says to take Oxybutynin 5 mg 1 daily. Please advise.

## 2019-06-27 NOTE — Telephone Encounter (Signed)
She must be mistaken. Meclinzine only comes in 25 mg. The oxybutnin is 5 mg daily.

## 2019-06-27 NOTE — Telephone Encounter (Signed)
Glendell Docker (DPR signed) left v/m that he is at work and prefers response by my chart message.pt has had diarrhea since 06/23/19; pt started oxybutynin 5 mg on 06/21/19. Glendell Docker wants to know if should stop oxybutynin and see if diarrhea stops or should pt try OTC immodium or what to do. [name] out of office and sending note to [name] FNP.

## 2019-06-30 ENCOUNTER — Other Ambulatory Visit: Payer: Self-pay | Admitting: Primary Care

## 2019-06-30 ENCOUNTER — Encounter: Payer: Self-pay | Admitting: *Deleted

## 2019-06-30 ENCOUNTER — Other Ambulatory Visit: Payer: Self-pay | Admitting: *Deleted

## 2019-06-30 DIAGNOSIS — R42 Dizziness and giddiness: Secondary | ICD-10-CM

## 2019-06-30 DIAGNOSIS — E785 Hyperlipidemia, unspecified: Secondary | ICD-10-CM

## 2019-06-30 MED ORDER — VIMPAT 100 MG PO TABS: 1.0000 | ORAL_TABLET | Freq: Two times a day (BID) | ORAL | 1 refills | Status: DC

## 2019-06-30 NOTE — Telephone Encounter (Signed)
Received refill request for Vimpat 100 mg BID. I checked [place] registry which indicated pt last filled this on 05/30/19 #62 for a 31 day supply. I placed a note on the refill prescriptions that an appt is needed. Mychart message also sent.

## 2019-06-30 NOTE — Telephone Encounter (Signed)
Per DPR, left detail message of comments for patient's son to call back.

## 2019-06-30 NOTE — Telephone Encounter (Signed)
[name] returned your call

## 2019-07-01 MED ORDER — MECLIZINE HCL 12.5 MG PO TABS: 12.5000 mg | ORAL_TABLET | Freq: Three times a day (TID) | ORAL | 0 refills | Status: DC | PRN

## 2019-07-01 NOTE — Telephone Encounter (Signed)
Noted, new Rx for meclizine 12.5 mg sent to pharmacy.

## 2019-07-01 NOTE — Telephone Encounter (Signed)
[name] called back. He stated that there is a miss understanding.  Patient would cut the meclizine 25 mg into 4 pieces so patient's son asked pharmacy to request a smaller dosage like the 12.5 mg. Is that okay to be sent?  Also patient's son stated last week patient started taking chocolate calcium and oxybutynin 5 mg then patient started to have diarrhea.  Now that the diarrhea has cleared, they will have patient take the oxybutynin and update.

## 2019-07-01 NOTE — Addendum Note (Signed)
Addended by: Pleas Koch on: 07/01/2019 10:44 AM   Modules accepted: Orders

## 2019-07-02 ENCOUNTER — Telehealth: Payer: Self-pay

## 2019-07-02 NOTE — Telephone Encounter (Signed)
[facility] DPR signed said that for 2 wks pt has had watery diarrhea on and off; after 2 days of no diarrhea pt had one diarrhea stool this morning; [name] is not sure if watery or just loose diarrhea.pt cannot wait to get to bathroom when has diarrhea and gets stool all over her. Pt has been holding the oxybutynin and can see improvement with diarrhea since stopping the oxybutynin until this morning. Pt wonders if probiotic could cause diarrhea. No fever,no abd pain. Pt is eating a bland diet and encouraging fluids; see pt message on 06/26/19 for more specifics about eating; [name] does not think appt would be helpful. [name] request cb with Gentry Fitz NP suggestions.

## 2019-07-02 NOTE — Telephone Encounter (Signed)
Please thank [name] for the update. I recommend they stop the probiotic to see if this helps. Oxybutynin typically causes constipation, not diarrhea, but if no improvement after stopping probiotic then stop oxybutynin.  Have them update me.

## 2019-07-02 NOTE — Telephone Encounter (Signed)
Spoken and notified patient's daughter of Kate Clark's comments. Patient's daughter verbalized understanding.  

## 2019-08-05 ENCOUNTER — Ambulatory Visit: Payer: Medicare Other | Admitting: Neurology

## 2019-08-13 ENCOUNTER — Other Ambulatory Visit: Payer: Self-pay | Admitting: Neurology

## 2019-08-13 ENCOUNTER — Other Ambulatory Visit: Payer: Self-pay | Admitting: Primary Care

## 2019-08-13 DIAGNOSIS — R32 Unspecified urinary incontinence: Secondary | ICD-10-CM

## 2019-08-14 NOTE — Telephone Encounter (Signed)
Did she resume oxybutynin for urinary incontinence? Is she doing okay on this medication? Does she want a refill?

## 2019-08-14 NOTE — Telephone Encounter (Signed)
Last prescribed on 06/16/2019 Last OV (hosptial follow up ) with Allie Bossier on 06/16/2019 No future OV scheduled

## 2019-08-15 NOTE — Telephone Encounter (Signed)
Spoke with patient's son.  Shari Vincent is not taking the oxybutynin and they did not request a refill.  Refill denied.  FYI to [place].

## 2019-08-26 ENCOUNTER — Encounter: Payer: Self-pay | Admitting: Family Medicine

## 2019-08-26 ENCOUNTER — Ambulatory Visit: Payer: Medicare PPO | Admitting: Family Medicine

## 2019-08-26 ENCOUNTER — Other Ambulatory Visit: Payer: Self-pay

## 2019-08-26 VITALS — BP 137/74 | HR 64 | Ht 59.0 in | Wt 155.0 lb

## 2019-08-26 DIAGNOSIS — R55 Syncope and collapse: Secondary | ICD-10-CM | POA: Diagnosis not present

## 2019-08-26 DIAGNOSIS — R413 Other amnesia: Secondary | ICD-10-CM | POA: Diagnosis not present

## 2019-08-26 DIAGNOSIS — I609 Nontraumatic subarachnoid hemorrhage, unspecified: Secondary | ICD-10-CM | POA: Diagnosis not present

## 2019-08-26 DIAGNOSIS — I680 Cerebral amyloid angiopathy: Secondary | ICD-10-CM

## 2019-08-26 NOTE — Patient Instructions (Signed)
We will continue Vimpat 100mg  and Trileptal 150mg  twice daily   Follow up closely with PCP  Stay well hydrated. Well balanced diet and regular mental/physical exercise encouraged.    Memory Compensation Strategies  1. Use "WARM" strategy.  W= write it down  A= associate it  R= repeat it  M= make a mental note  2.   You can keep a Social worker.  Use a 3-ring notebook with sections for the following: calendar, important names and phone numbers,  medications, doctors' names/phone numbers, lists/reminders, and a section to journal what you did  each day.   3.    Use a calendar to write appointments down.  4.    Write yourself a schedule for the day.  This can be placed on the calendar or in a separate section of the Memory Notebook.  Keeping a  regular schedule can help memory.  5.    Use medication organizer with sections for each day or morning/evening pills.  You may need help loading it  6.    Keep a basket, or pegboard by the door.  Place items that you need to take out with you in the basket or on the pegboard.  You may also want to  include a message board for reminders.  7.    Use sticky notes.  Place sticky notes with reminders in a place where the task is performed.  For example: " turn off the  stove" placed by the stove, "lock the door" placed on the door at eye level, " take your medications" on  the bathroom mirror or by the place where you normally take your medications.  8.    Use alarms/timers.  Use while cooking to remind yourself to check on food or as a reminder to take your medicine, or as a  reminder to make a call, or as a reminder to perform another task, etc.   Memantine Tablets What is this medicine? MEMANTINE (MEM an teen) is used to treat dementia caused by Alzheimer's disease. This medicine may be used for other purposes; ask your health care provider or pharmacist if you have questions. COMMON BRAND NAME(S): Namenda What should I tell my health  care provider before I take this medicine? They need to know if you have any of these conditions:  difficulty passing urine  kidney disease  liver disease  seizures  an unusual or allergic reaction to memantine, other medicines, foods, dyes, or preservatives  pregnant or trying to get pregnant  breast-feeding How should I use this medicine? Take this medicine by mouth with a glass of water. Follow the directions on the prescription label. You may take this medicine with or without food. Take your doses at regular intervals. Do not take your medicine more often than directed. Continue to take your medicine even if you feel better. Do not stop taking except on the advice of your doctor or health care professional. Talk to your pediatrician regarding the use of this medicine in children. Special care may be needed. Overdosage: If you think you have taken too much of this medicine contact a poison control center or emergency room at once. NOTE: This medicine is only for you. Do not share this medicine with others. What if I miss a dose? If you miss a dose, take it as soon as you can. If it is almost time for your next dose, take only that dose. Do not take double or extra doses. If you do not take  your medicine for several days, contact your health care provider. Your dose may need to be changed. What may interact with this medicine?  acetazolamide  amantadine  cimetidine  dextromethorphan  dofetilide  hydrochlorothiazide  ketamine  metformin  methazolamide  quinidine  ranitidine  sodium bicarbonate  triamterene This list may not describe all possible interactions. Give your health care provider a list of all the medicines, herbs, non-prescription drugs, or dietary supplements you use. Also tell them if you smoke, drink alcohol, or use illegal drugs. Some items may interact with your medicine. What should I watch for while using this medicine? Visit your doctor or  health care professional for regular checks on your progress. Check with your doctor or health care professional if there is no improvement in your symptoms or if they get worse. You may get drowsy or dizzy. Do not drive, use machinery, or do anything that needs mental alertness until you know how this drug affects you. Do not stand or sit up quickly, especially if you are an older patient. This reduces the risk of dizzy or fainting spells. Alcohol can make you more drowsy and dizzy. Avoid alcoholic drinks. What side effects may I notice from receiving this medicine? Side effects that you should report to your doctor or health care professional as soon as possible:  allergic reactions like skin rash, itching or hives, swelling of the face, lips, or tongue  agitation or a feeling of restlessness  depressed mood  dizziness  hallucinations  redness, blistering, peeling or loosening of the skin, including inside the mouth  seizures  vomiting Side effects that usually do not require medical attention (report to your doctor or health care professional if they continue or are bothersome):  constipation  diarrhea  headache  nausea  trouble sleeping This list may not describe all possible side effects. Call your doctor for medical advice about side effects. You may report side effects to FDA at 1-800-FDA-1088. Where should I keep my medicine? Keep out of the reach of children. Store at room temperature between 15 degrees and 30 degrees C (59 degrees and 86 degrees F). Throw away any unused medicine after the expiration date. NOTE: This sheet is a summary. It may not cover all possible information. If you have questions about this medicine, talk to your doctor, pharmacist, or health care provider.  2020 Elsevier/Gold Standard (2012-11-25 14:10:42)    Stroke Prevention Some medical conditions and lifestyle choices can lead to a higher risk for a stroke. You can help to prevent a stroke  by making nutrition, lifestyle, and other changes. What nutrition changes can be made?   Eat healthy foods. ? Choose foods that are high in fiber. These include:  Fresh fruits.  Fresh vegetables.  Whole grains. ? Eat at least 5 or more servings of fruits and vegetables each day. Try to fill half of your plate at each meal with fruits and vegetables. ? Choose lean protein foods. These include:  Lowfat (lean) cuts of meat.  Chicken without skin.  Fish.  Tofu.  Beans.  Nuts. ? Eat low-fat dairy products. ? Avoid foods that:  Are high in salt (sodium).  Have saturated fat.  Have trans fat.  Have cholesterol.  Are processed.  Are premade.  Follow eating guidelines as told by your doctor. These may include: ? Reducing how many calories you eat and drink each day. ? Limiting how much salt you eat or drink each day to 1,500 milligrams (mg). ? Using only  healthy fats for cooking. These include:  Olive oil.  Canola oil.  Sunflower oil. ? Counting how many carbohydrates you eat and drink each day. What lifestyle changes can be made?  Try to stay at a healthy weight. Talk to your doctor about what a good weight is for you.  Get at least 30 minutes of moderate physical activity at least 5 days a week. This can include: ? Fast walking. ? Biking. ? Swimming.  Do not use any products that have nicotine or tobacco. This includes cigarettes and e-cigarettes. If you need help quitting, ask your doctor. Avoid being around tobacco smoke in general.  Limit how much alcohol you drink to no more than 1 drink a day for nonpregnant women and 2 drinks a day for men. One drink equals 12 oz of beer, 5 oz of wine, or 1 oz of hard liquor.  Do not use drugs.  Avoid taking birth control pills. Talk to your doctor about the risks of taking birth control pills if: ? You are over 32 years old. ? You smoke. ? You get migraines. ? You have had a blood clot. What other changes can  be made?  Manage your cholesterol. ? It is important to eat a healthy diet. ? If your cholesterol cannot be managed through your diet, you may also need to take medicines. Take medicines as told by your doctor.  Manage your diabetes. ? It is important to eat a healthy diet and to exercise regularly. ? If your blood sugar cannot be managed through diet and exercise, you may need to take medicines. Take medicines as told by your doctor.  Control your high blood pressure (hypertension). ? Try to keep your blood pressure below 130/80. This can help lower your risk of stroke. ? It is important to eat a healthy diet and to exercise regularly. ? If your blood pressure cannot be managed through diet and exercise, you may need to take medicines. Take medicines as told by your doctor. ? Ask your doctor if you should check your blood pressure at home. ? Have your blood pressure checked every year. Do this even if your blood pressure is normal.  Talk to your doctor about getting checked for a sleep disorder. Signs of this can include: ? Snoring a lot. ? Feeling very tired.  Take over-the-counter and prescription medicines only as told by your doctor. These may include aspirin or blood thinners (antiplatelets or anticoagulants).  Make sure that any other medical conditions you have are managed. Where to find more information  American Stroke Association: www.strokeassociation.org  National Stroke Association: www.stroke.org Get help right away if:  You have any symptoms of stroke. "BE FAST" is an easy way to remember the main warning signs: ? B - Balance. Signs are dizziness, sudden trouble walking, or loss of balance. ? E - Eyes. Signs are trouble seeing or a sudden change in how you see. ? F - Face. Signs are sudden weakness or loss of feeling of the face, or the face or eyelid drooping on one side. ? A - Arms. Signs are weakness or loss of feeling in an arm. This happens suddenly and usually  on one side of the body. ? S - Speech. Signs are sudden trouble speaking, slurred speech, or trouble understanding what people say. ? T - Time. Time to call emergency services. Write down what time symptoms started.  You have other signs of stroke, such as: ? A sudden, very bad headache with  no known cause. ? Feeling sick to your stomach (nausea). ? Throwing up (vomiting). ? Jerky movements you cannot control (seizure). These symptoms may represent a serious problem that is an emergency. Do not wait to see if the symptoms will go away. Get medical help right away. Call your local emergency services (911 in the [place] Do not drive yourself to the hospital. Summary  You can prevent a stroke by eating healthy, exercising, not smoking, drinking less alcohol, and treating other health problems, such as diabetes, high blood pressure, or high cholesterol.  Do not use any products that contain nicotine or tobacco, such as cigarettes and e-cigarettes.  Get help right away if you have any signs or symptoms of a stroke. This information is not intended to replace advice given to you by your health care provider. Make sure you discuss any questions you have with your health care provider. Document Revised: 04/04/2018 Document Reviewed: 05/10/2016 Elsevier Patient Education  [place].

## 2019-08-26 NOTE — Progress Notes (Addendum)
PATIENT: Shari Vincent DOB: 27-Feb-1931  REASON FOR VISIT: follow up HISTORY FROM: patient  Chief Complaint  Patient presents with  . Follow-up    Rm 1 here for a f/u on seizures. Pt is having no new seisure sx.     HISTORY OF PRESENT ILLNESS: Today 08/26/19 Shari Vincent is a 86 y.o. 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 She is s/p SAH in 2018. She continues Vimpat 100mg  BID and Trileptal 150mg  BID. No seizure like activity since last being seen.  She continues close follow up with PCP for management of comorbidities and stroke prevention. She is taking rosuvastatin 5mg  daily. She does have intermittent vertigo and vasovagal syncopal episodes. Synsopal events occur with bowel movements. Last episode about a month ago. She does take meclizine for vertigo that seems to help. She had a fall in 05/2019 with a syncopal event. Evaluation was unremarkable. She does use a Rolator.   She lives with her husband. They have a caregiver who comes daily. Her children come on the weekends. Caregiver helps with medications. She uses pill packs with pharmacy. She does not drive. She is able to perform ADL's independently. She is not as active as she used to be. She does go to church on Sundays. She is hesitant to start memory medications.   HISTORY: (copied from Dr Cathren Laine note on 08/05/2018)  Interval history 08/05/2018: Spoke with daughter and patient. She fell in January 2020, she was face down in a puddle of blood. She hit her head but went to the ED, she is doing well. She is still on vimpat and oxcarb as well as Gabapentin. She uses her cane in the house mostly, sometimes she forgets. She had home PT.   Reviewed CT of the head 03/07/2018: images: 1. No skull fracture or intracranial hemorrhage. 2. No cervical spine fracture or subluxation. 3. Moderate diffuse cerebral and cerebellar atrophy. 4. Moderate  chronic small vessel white matter ischemic changes in both cerebral hemispheres. 5. Multilevel cervical spine degenerative changes. 6. Bilateral carotid artery atheromatous calcifications.  Interval history 10/30/2017: Patient is here for follow-up she has a past medical history of hyperlipidemia, trigeminal neuralgia, cerebral amyloid angiopathy, subarachnoid hemorrhage secondary with symptoms of left-sided numbness and weakness who presented to the emergency room in August with complaints of acute right arm weakness, numbness and tingling while shopping with family.  Patient became dysarthric and aphasic in triage.  She improved from an NIH score 17 down to 1 in the ED.  She was admitted and seen by the stroke team.  The episodes were transient, and resolved in 30 minutes.  There was no evidence of large vessel occlusion on CTA head and neck.  There was no infarct, there is atrophy and small vessel disease, few probable faint punctate acute or early subacute infarctions in the left precentral gyrus and parietal cortex, increased superficial siderosis since 2018.  She cannot take platelets in the setting of amyloid angiopathy.  She was given Trileptal twice daily.  Her statin was changed.    Interval history 01/15/2017:  This is an 86 year old female with a history of hyperlipidemia, trigeminal neuralgia, diverticulitis who was transferred to [facility] hospital in early May of this year for left facial droop, left-sided numbness and weakness, secondary to a subarachnoid hemorrhage with a small frontal predominance and left superficial hemosiderosis likely secondary to cerebral amyloid angiopathy.  Given her cerebral amyloid angiopathy recommended  avoiding antiplatelets.  Patient was started on Keppra 500 mg twice a day.  She has been on Trileptal for many years for trigeminal neuralgia for which she is asymptomatic at this time we discussed repeating EEG, CT of the head and then possibly trying to discontinue  Keppra and Trileptal very slowly.  We also ordered home health for gait and safety evaluation and treatment and risk of falls.   She has been well. She has a new issue some numbness in the left leg. Started a few weeks ago. She was walking around at [employer] and when she walks she has numbness in her leg. In the calf and lower leg, around the leg, better if she sits down, she has sciatica on the right side. She had home therapy which helped with her right leg sciatica. No more episodes of left-sided weakness.    Patient returns today for follow-up.  PPJ:KDTOI L Carrollis a 86 y.o.femalehere as a referral from Dr. Anthoney Harada follow-up on subarachnoid hemorrhage. ReviewofRecords shows:patient was transferred from an outside hospital for evaluation of right-sided subarachnoid hemorrhage in early May of this year. Patient had been having episodes of left-sided numbness and weakness for 2 weeks. This included tingling, weakness, difficulty moving the arm, tingling of the left face, numbness and tingling of the left arm and the outside of her left leg also with a mild facial droop. A CT was obtained and showed a small amount of subarachnoid hemorrhage involving the right frontal convexity CTA showed no obvious aneurysms and she was transferred to [facility] for further management. NIH stroke scale in the emergency room was 1.She is here with her daughter who provides much information. Patient today says she has some residual numbness. It comes and goes. She had headaches but now have resolved. She had side effects to Keppra and she is on Vimpat now. She lives with her husband. They have lived in the same house for 60+ years and have been married that long too. Symptoms had been waxing and waning for a week before they went to the ED. Her arm went weak, then leg and face numbness and tingling. Some mild aphasia with words.No other focal neurologic deficits, associated symptoms, inciting events or  modifiable factors.She has imbalance.  Reviewed notes, labs and imaging from outside physicians, which showed:  Personally reviewed images and agree with the following:  Mr Brain Wo Contrast Mr Angiogram Head Wo Contrast 06/25/2016  1. Trace subarachnoid hemorrhage along the right superior frontal convexity seen in two adjacent sulci. No associated acute infarct or other acute signal abnormality in the region. 2. 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 No other chronic cerebral blood products or superficial siderosis identified. 3. Intracranial MRA appears stable since 2012 with no intracranial stenosis or aneurysm identified. The entire area of subarachnoid hemorrhage was not covered by MRA but no cerebral AVM is suspected. 4. Chronically advanced and mildly progressed nonspecific cerebral white matter signal changes since 2012. There are also two small areas of cortical encephalomalacia in the right inferior frontal and right posterior parietal lobe which are new since 2012. Several tiny chronic cerebellar lacunar infarcts are also new.  Reviewed EEG report which was abnormal demonstrating a very mild diffuse slowing of activity but no focal, hemispheric or lateralizing features. No epileptiform activity was recorded.   REVIEW OF SYSTEMS: Out of a complete 14 system review of symptoms, the patient  complains only of the following symptoms, dizziness, syncopal events, memory loss and all other reviewed systems are negative.   ALLERGIES: Allergies  Allergen Reactions  . Albuterol Rash  . Cefdinir Diarrhea  . Lactose Intolerance (Gi) Nausea And Vomiting and Other (See Comments)    Patient has IBS  . Levofloxacin Nausea Only  . Other Nausea And Vomiting    No greasy foods!!  . Adhesive [Tape] Rash    Paper tape is tolerated  . Epinephrine Anxiety and Palpitations  . Latex  Rash    HOME MEDICATIONS: Outpatient Medications Prior to Visit  Medication Sig Dispense Refill  . Calcium-Vitamin D-Vitamin K (CHEWABLE CALCIUM PO) Take by mouth daily.    . Cholecalciferol (D3 MAXIMUM STRENGTH) 5000 units capsule Take 5,000 Units by mouth daily with lunch.     . Cyanocobalamin (VITAMIN B-12 PO) Take by mouth daily.    Marland Kitchen gabapentin (NEURONTIN) 100 MG capsule Take 1 capsule (100 mg total) by mouth at bedtime. For back pain. 90 capsule 1  . hyoscyamine (LEVBID) 0.375 MG 12 hr tablet Take 1 tablet (0.375 mg total) by mouth every morning. 90 tablet 3  . Lacosamide (VIMPAT) 100 MG TABS Take 1 tablet (100 mg total) by mouth 2 (two) times daily. Appointment needed for further refills. 180 tablet 1  . Liniments (BLUE-EMU SUPER STRENGTH) CREA Apply 1 application topically 3 (three) times daily as needed (for knee and back pain).     . meclizine (ANTIVERT) 12.5 MG tablet Take 1 tablet (12.5 mg total) by mouth 3 (three) times daily as needed for dizziness. 30 tablet 0  . OXcarbazepine (TRILEPTAL) 150 MG tablet TAKE 1 TABLET (150 MG TOTAL) BY MOUTH 2 (TWO) TIMES DAILY. 60 tablet 0  . pantoprazole (PROTONIX) 20 MG tablet Take 1 tablet (20 mg total) by mouth 2 (two) times daily. 180 tablet 3  . Probiotic Product (PROBIOTIC PO) Take 1 capsule by mouth daily.    . rosuvastatin (CRESTOR) 5 MG tablet TAKE ONE TABLET BY MOUTH ON MONDAY, WEDNESDAY AND FRIDAY AT BEDTIME FOR CHOLESTEROL 13 tablet 3  . nitrofurantoin, macrocrystal-monohydrate, (MACROBID) 100 MG capsule Take 100 mg by mouth at bedtime.     Marland Kitchen oxybutynin (DITROPAN-XL) 5 MG 24 hr tablet Take 1 tablet (5 mg total) by mouth at bedtime. For urinary incontinence. 30 tablet 0   No facility-administered medications prior to visit.    PAST MEDICAL HISTORY: Past Medical History:  Diagnosis Date  . Acid reflux   . Acute cerebrovascul insuff, transient focal neurologic signs/symptoms 06/27/2016  . Arthritis    right knee  . Bulging lumbar  disc   . Cancer ([place])    Basal Cell on right shoulder  . Colon polyps   . Concussion with brief loss of consciousness 03/08/2018  . Diverticulitis   . Fall 02/2018  . High cholesterol   . Irritable bowel syndrome (IBS)   . Kidney stone   . Knee pain    right  . Sciatic pain   . Stroke ([place]) 09/2017  . Subarachnoid hemorrhage ([place])   . Trigeminal neuralgia   . Trigeminal neuralgia of right side of face   . Trigeminal neuralgia syndrome   . Vertigo     PAST SURGICAL HISTORY: Past Surgical History:  Procedure Laterality Date  . BREAST BIOPSY Left   . BREAST SURGERY     biopsy in 1990   . CATARACT EXTRACTION    . COLONOSCOPY  05/27/2008   IHK:[id number] rectal polyp,s/p bx/sigmoid diverticula. hyperplastic  polyps. benign random bx  . ESOPHAGOGASTRODUODENOSCOPY  05/27/2008   YJE:HUDJSH/FWYOV HH  . EYE SURGERY    . FOOT SURGERY     Left foot, metal plate  . HYSTEROSCOPY WITH D & C N/A 09/03/2014   Procedure: DILATATION AND CURETTAGE /HYSTEROSCOPY;  Surgeon: Donnamae Jude, MD;  Location: [facility];  Service: Gynecology;  Laterality: N/A;  Requesting 09/03/14 @ 2:00p  . HYSTEROSCOPY WITH D & C N/A 08/19/2015   Procedure: DILATATION AND CURETTAGE /HYSTEROSCOPY;  Surgeon: Donnamae Jude, MD;  Location: [facility];  Service: Gynecology;  Laterality: N/A;  . LITHOTRIPSY    . OTHER SURGICAL HISTORY     Uterine Prolapse/Tacking  . PROLAPSED UTERINE FIBROID LIGATION     in 1960  . TOOTH EXTRACTION  09/2017    FAMILY HISTORY: Family History  Problem Relation Age of Onset  . Diabetes Mother   . Heart disease Mother        MI  . Uterine cancer Mother   . Stroke Father   . Cancer Paternal Grandmother        intestinal  . Stomach cancer Maternal Grandmother   . Colon cancer Neg Hx   . Breast cancer Neg Hx     SOCIAL HISTORY: Social History   Socioeconomic History  . Marital status: Married    Spouse name: Not on file  . Number of children: 3  . Years of education: 86  . Highest  education level: Not on file  Occupational History  . Not on file  Tobacco Use  . Smoking status: Former Smoker    Types: Cigarettes  . Smokeless tobacco: Never Used  Vaping Use  . Vaping Use: Never used  Substance and Sexual Activity  . Alcohol use: No  . Drug use: No  . Sexual activity: Not Currently    Birth control/protection: Post-menopausal  Other Topics Concern  . Not on file  Social History Narrative   Married 62 years.   Has three children, six grandchildren, 5 great grandchildren    Enjoys going to church, baking, shop.   Caffeine: occasional tea when eating out   Right-handed   Lives at home with husband. Has a caregiver during the week for 4 hours a day (not overnight).   Social Determinants of Health   Financial Resource Strain:   . Difficulty of Paying Living Expenses:   Food Insecurity:   . Worried About Charity fundraiser in the Last Year:   . Arboriculturist in the Last Year:   Transportation Needs:   . Film/video editor (Medical):   Marland Kitchen Lack of Transportation (Non-Medical):   Physical Activity:   . Days of Exercise per Week:   . Minutes of Exercise per Session:   Stress:   . Feeling of Stress :   Social Connections:   . Frequency of Communication with Friends and Family:   . Frequency of Social Gatherings with Friends and Family:   . Attends Religious Services:   . Active Member of Clubs or Organizations:   . Attends Archivist Meetings:   Marland Kitchen Marital Status:   Intimate Partner Violence:   . Fear of Current or Ex-Partner:   . Emotionally Abused:   Marland Kitchen Physically Abused:   . Sexually Abused:       PHYSICAL EXAM  Vitals:   08/26/19 1031  BP: 137/74  Pulse: 64  Weight: 155 lb (70.3 kg)  Height: 4\' 11"  (1.499 m)   Body mass index  is 31.31 kg/m.  Generalized: Well developed, in no acute distress  Cardiology: normal rate and rhythm, no murmur noted Respiratory: clear to auscultation bilaterally  Neurological examination    Mentation: Alert oriented to time, place, history taking. Follows all commands speech and language fluent Cranial nerve II-XII: Pupils were equal round reactive to light. Extraocular movements were full, visual field were full on confrontational test. Facial sensation and strength were normal. Uvula tongue midline. Head turning and shoulder shrug  were normal and symmetric. Motor: The motor testing reveals 5 over 5 strength of all 4 extremities. Good symmetric motor tone is noted throughout.  Sensory: Sensory testing is intact to soft touch on all 4 extremities. No evidence of extinction is noted.  Gait and station: Gait is stable with Rolator. Short steps. Tandem not attempted  DIAGNOSTIC DATA (LABS, IMAGING, TESTING) - I reviewed patient records, labs, notes, testing and imaging myself where available.  MMSE - Mini Mental State Exam 08/26/2019 12/19/2017 12/13/2016  Orientation to time 4 5 5   Orientation to Place 3 5 5   Registration 3 3 3   Attention/ Calculation 0 0 0  Recall 0 3 3  Language- name 2 objects 2 0 0  Language- repeat 1 1 1   Language- follow 3 step command 3 3 3   Language- read & follow direction 1 0 0  Write a sentence 1 0 0  Copy design 0 0 0  Total score 18 20 20      Lab Results  Component Value Date   WBC 7.4 02/11/2019   HGB 12.3 02/11/2019   HCT 37.6 02/11/2019   MCV 94.8 02/11/2019   PLT 185.0 02/11/2019      Component Value Date/Time   NA 134 (L) 02/11/2019 1209   K 3.9 02/11/2019 1209   CL 101 02/11/2019 1209   CO2 25 02/11/2019 1209   GLUCOSE 110 (H) 02/11/2019 1209   BUN 10 02/11/2019 1209   CREATININE 1.16 02/11/2019 1209   CALCIUM 9.7 02/11/2019 1209   PROT 6.9 12/31/2018 1331   ALBUMIN 4.2 12/31/2018 1331   AST 14 12/31/2018 1331   ALT 8 12/31/2018 1331   ALKPHOS 61 12/31/2018 1331   BILITOT 0.5 12/31/2018 1331   GFRNONAA 56 (L) 03/07/2018 2054   GFRAA >60 03/07/2018 2054   Lab Results  Component Value Date   CHOL 138 12/31/2018   HDL  45.30 12/31/2018   LDLCALC 65 12/31/2018   LDLDIRECT 29.0 11/26/2017   TRIG 138.0 12/31/2018   CHOLHDL 3 12/31/2018   Lab Results  Component Value Date   HGBA1C 5.6 09/28/2017   Lab Results  Component Value Date   VITAMINB12 >1500 (H) 06/16/2019   Lab Results  Component Value Date   TSH 1.62 12/31/2018       ASSESSMENT AND PLAN 86 y.o. year old female  has a past medical history of Acid reflux, Acute cerebrovascul insuff, transient focal neurologic signs/symptoms (06/27/2016), Arthritis, Bulging lumbar disc, Cancer ([place]), Colon polyps, Concussion with brief loss of consciousness (03/08/2018), Diverticulitis, Fall (02/2018), High cholesterol, Irritable bowel syndrome (IBS), Kidney stone, Knee pain, Sciatic pain, Stroke ([place]) (09/2017), Subarachnoid hemorrhage (HCC), Trigeminal neuralgia, Trigeminal neuralgia of right side of face, Trigeminal neuralgia syndrome, and Vertigo. here with     ICD-10-CM   1. Cerebral amyloid angiopathy (CODE)  I68.0 CMP    10-Hydroxycarbazepine  2. Subarachnoid hemorrhage (HCC)  I60.9   3. Syncope, unspecified syncope type  R55   4. Memory loss  R41.3     Shari Vincent  is doing well today. She continues Vimpat and Trileptal without obvious adverse effects. She did not have EEG performed and is hesitant about weaning Vimpat. Transient events have resolved since continuing Vimpat and Trileptal. We will continue current treatment plan. I will check labs today. We have discussed adding Aricept or Namenda for memory loss. She and her daughter are hesitant as she has continued to have syncopal episodes with defecation. I am not certain benefit outweighs potential risks at this time. I have provided information on Namenda for the family to review. May consider low dose with slow titration in future. We have discussed need for safety and fall precautions. Stroke prevention reviewed. Healthy lifestyle habits encouraged. Memory compensation strategies reviewed and regular  physical/mental exercise encouraged. She will follow up closely with PCP as advised. She will return to see [place] in 6-12 months. She and her daughter verbalizes understanding and agreement with this plan.    Orders Placed This Encounter  Procedures  . CMP  . 10-Hydroxycarbazepine     No orders of the defined types were placed in this encounter.     I spent 30 minutes with the patient. 50% of this time was spent counseling and educating patient on plan of care and medications.    Debbora Presto, FNP-C 08/26/2019, 1:16 PM [facility] [address], [place], [place] [address] [phone] Made any corrections needed, and agree with history, physical, neuro exam,assessment and plan as stated.     Sarina Ill, MD [facility]

## 2019-08-28 ENCOUNTER — Encounter: Payer: Self-pay | Admitting: *Deleted

## 2019-08-31 LAB — COMPREHENSIVE METABOLIC PANEL
ALT: 7 IU/L (ref 0–32)
AST: 11 IU/L (ref 0–40)
Albumin/Globulin Ratio: 1.5 (ref 1.2–2.2)
Albumin: 4.3 g/dL (ref 3.6–4.6)
Alkaline Phosphatase: 80 IU/L (ref 48–121)
BUN/Creatinine Ratio: 12 (ref 12–28)
BUN: 9 mg/dL (ref 8–27)
Bilirubin Total: 0.3 mg/dL (ref 0.0–1.2)
CO2: 24 mmol/L (ref 20–29)
Calcium: 9.6 mg/dL (ref 8.7–10.3)
Chloride: 100 mmol/L (ref 96–106)
Creatinine, Ser: 0.78 mg/dL (ref 0.57–1.00)
GFR calc Af Amer: 80 mL/min/{1.73_m2} (ref >59)
GFR calc non Af Amer: 69 mL/min/{1.73_m2} (ref >59)
Globulin, Total: 2.9 g/dL (ref 1.5–4.5)
Glucose: 83 mg/dL (ref 65–99)
Potassium: 4.4 mmol/L (ref 3.5–5.2)
Sodium: 137 mmol/L (ref 134–144)
Total Protein: 7.2 g/dL (ref 6.0–8.5)

## 2019-08-31 LAB — 10-HYDROXYCARBAZEPINE: Oxcarbazepine SerPl-Mcnc: 8 ug/mL — ABNORMAL LOW (ref 10–35)

## 2019-09-12 ENCOUNTER — Other Ambulatory Visit: Payer: Self-pay | Admitting: Primary Care

## 2019-09-12 ENCOUNTER — Other Ambulatory Visit: Payer: Self-pay | Admitting: Neurology

## 2019-09-12 DIAGNOSIS — R42 Dizziness and giddiness: Secondary | ICD-10-CM

## 2019-09-12 NOTE — Telephone Encounter (Signed)
Last prescribed on 07/01/2019 Last OV (acute follow up ) with Allie Bossier on 06/16/2019 No future OV scheduled

## 2019-09-12 NOTE — Telephone Encounter (Signed)
Refills sent to pharmacy. 

## 2019-10-09 ENCOUNTER — Other Ambulatory Visit: Payer: Self-pay | Admitting: Neurology

## 2019-10-20 ENCOUNTER — Telehealth: Payer: Self-pay

## 2019-10-20 NOTE — Telephone Encounter (Signed)
Dominica Severin pts son (DPR signed) said that for last 3 months pt has been having memory issues; now pt has worsened; pt thinks her husband is having sex with caregivers; pt has run off all 4 caregivers. Dominica Severin does not think pt would harm  Herself or anyone else but Dominica Severin is afraid for their safety due to things such as pt cooking and burning food on the stove; fearful might catch house on fire; Dominica Severin said pt stays  Mad at him and his siblings; Dominica Severin said he has heard his mom say things that he never thought he would hear come from his mom. Pt also at times does not think that she is still home and threatens to leave because it is not her home. I advised Dominica Severin that someone should stay with pt tonight. Gentry Fitz NP said she can add her on 10/21/19 at 12:20; [name] said for Dominica Severin to encourage fluids tomorrow morning so when pt gets here we can get a urine specimen. Dominica Severin voiced understanding and will have pt at [facility] shortly after 12 noon to ck in. UC & ED precautions given to Dominica Severin and he voiced understanding. FYI to Gentry Fitz NP.

## 2019-10-21 ENCOUNTER — Ambulatory Visit (INDEPENDENT_AMBULATORY_CARE_PROVIDER_SITE_OTHER): Payer: Medicare PPO | Admitting: Primary Care

## 2019-10-21 ENCOUNTER — Encounter: Payer: Self-pay | Admitting: Primary Care

## 2019-10-21 ENCOUNTER — Other Ambulatory Visit: Payer: Self-pay

## 2019-10-21 VITALS — BP 120/78 | HR 65 | Ht 59.0 in | Wt 155.0 lb

## 2019-10-21 DIAGNOSIS — R413 Other amnesia: Secondary | ICD-10-CM

## 2019-10-21 DIAGNOSIS — R410 Disorientation, unspecified: Secondary | ICD-10-CM

## 2019-10-21 DIAGNOSIS — E785 Hyperlipidemia, unspecified: Secondary | ICD-10-CM

## 2019-10-21 LAB — COMPREHENSIVE METABOLIC PANEL
ALT: 9 U/L (ref 0–35)
AST: 22 U/L (ref 0–37)
Albumin: 4.5 g/dL (ref 3.5–5.2)
Alkaline Phosphatase: 57 U/L (ref 39–117)
BUN: 10 mg/dL (ref 6–23)
CO2: 28 mEq/L (ref 19–32)
Calcium: 9.5 mg/dL (ref 8.4–10.5)
Chloride: 104 mEq/L (ref 96–112)
Creatinine, Ser: 0.83 mg/dL (ref 0.40–1.20)
GFR: 65.1 mL/min (ref >60.00)
Glucose, Bld: 87 mg/dL (ref 70–99)
Potassium: 4.4 mEq/L (ref 3.5–5.1)
Sodium: 139 mEq/L (ref 135–145)
Total Bilirubin: 0.6 mg/dL (ref 0.2–1.2)
Total Protein: 7.2 g/dL (ref 6.0–8.3)

## 2019-10-21 LAB — POC URINALSYSI DIPSTICK (AUTOMATED)
Bilirubin, UA: NEGATIVE
Blood, UA: NEGATIVE
Glucose, UA: NEGATIVE
Ketones, UA: NEGATIVE
Nitrite, UA: NEGATIVE
Protein, UA: NEGATIVE
Spec Grav, UA: 1.015 (ref 1.010–1.025)
Urobilinogen, UA: 0.2 E.U./dL
pH, UA: 6 (ref 5.0–8.0)

## 2019-10-21 LAB — LIPID PANEL
Cholesterol: 126 mg/dL (ref 0–200)
HDL: 45.2 mg/dL (ref >39.00)
LDL Cholesterol: 57 mg/dL (ref 0–99)
NonHDL: 81
Total CHOL/HDL Ratio: 3
Triglycerides: 121 mg/dL (ref 0.0–149.0)
VLDL: 24.2 mg/dL (ref 0.0–40.0)

## 2019-10-21 LAB — CBC WITH DIFFERENTIAL/PLATELET
Basophils Absolute: 0 10*3/uL (ref 0.0–0.1)
Basophils Relative: 0.5 % (ref 0.0–3.0)
Eosinophils Absolute: 0.1 10*3/uL (ref 0.0–0.7)
Eosinophils Relative: 1.3 % (ref 0.0–5.0)
HCT: 39 % (ref 36.0–46.0)
Hemoglobin: 13 g/dL (ref 12.0–15.0)
Lymphocytes Relative: 39.4 % (ref 12.0–46.0)
Lymphs Abs: 2.4 10*3/uL (ref 0.7–4.0)
MCHC: 33.3 g/dL (ref 30.0–36.0)
MCV: 95.2 fl (ref 78.0–100.0)
Monocytes Absolute: 0.5 10*3/uL (ref 0.1–1.0)
Monocytes Relative: 8.6 % (ref 3.0–12.0)
Neutro Abs: 3.1 10*3/uL (ref 1.4–7.7)
Neutrophils Relative %: 50.2 % (ref 43.0–77.0)
Platelets: 171 10*3/uL (ref 150.0–400.0)
RBC: 4.09 Mil/uL (ref 3.87–5.11)
RDW: 13.6 % (ref 11.5–15.5)
WBC: 6.1 10*3/uL (ref 4.0–10.5)

## 2019-10-21 LAB — VITAMIN B12: Vitamin B-12: >1526 pg/mL — ABNORMAL HIGH (ref 211–911)

## 2019-10-21 LAB — HEMOGLOBIN A1C: Hgb A1c MFr Bld: 5.7 % (ref 4.6–6.5)

## 2019-10-21 LAB — TSH: TSH: 1.32 u[IU]/mL (ref 0.35–4.50)

## 2019-10-21 NOTE — Telephone Encounter (Signed)
Noted, will evaluate. 

## 2019-10-21 NOTE — Assessment & Plan Note (Signed)
Highly suspicious for Dementia, especially with anger and agitation changes in her mood. I too am concerned about her safety and well being, agree with family. Will place social work referral to see if they can assist family with daytime care.    FL2 form completed for assisted living. UA today is looking negative, will send culture to verify.  Checking labs today to rule out any other cause for changes.  I will get in touch with her neurologist to see if there are any recommendations.  Consider Namenda, family agrees to medication treatment.

## 2019-10-21 NOTE — Progress Notes (Signed)
Subjective:    Patient ID: Shari Vincent, female    DOB: Feb 16, 1932, 86 y.o.   MRN: [id number]  HPI  This visit occurred during the SARS-CoV-2 public health emergency.  Safety protocols were in place, including screening questions prior to the visit, additional usage of staff PPE, and extensive cleaning of exam room while observing appropriate contact time as indicated for disinfecting solutions.   Shari Vincent is a 86 year old female with a history of TIA, cerebral amyloid angiopathy, syncope, trigeminal neuralgia, subarachnoid hemorrhage, vertigo, urinary incontinence who presents today with her son to discuss confusion.  Her son is providing some of the information for HPI.  He and his siblings are very concerned about their mother and her decline in memory and newer symptoms of aggression.  Their family has arranged for health care agents to come in daily to assist with cooking, cleaning, laundry, etc, over the last nearly two years, but recently their mother has dismissed each of the staff members from her home. The patient has become verbally aggressive to both her family and these home care agents, and now the home care agents refuse to return. She believe that the staff is having an inappropriate relationship with her husband, and also she feels that the staff is doing things around her home that she should be doing.   Her family is extremely concerned about her safety at home. She has attempted to cook meals several times, has left the stove on unattended and has burned several meals. They are also concerned fall risk, walking up and down stairs, and her more recent syncopal episodes. He and his siblings agree that they need someone to stay with them 24 hours daily. The problem with this is that she has turned away numerous health care agents and the family cannot provide the 24 hours assistance needed.   She and her family have visited [facility] in [place], and  she has agreed to move once a room is available. Services offered include cleaning, laundry, activities, and meals.   Review of Systems  Constitutional: Negative for fever.  Genitourinary: Negative for dysuria.  Neurological:       See HPI  Psychiatric/Behavioral: Positive for confusion.       See HPI       Past Medical History:  Diagnosis Date  . Acid reflux   . Acute cerebrovascul insuff, transient focal neurologic signs/symptoms 06/27/2016  . Arthritis    right knee  . Bulging lumbar disc   . Cancer ([place])    Basal Cell on right shoulder  . Colon polyps   . Concussion with brief loss of consciousness 03/08/2018  . Diverticulitis   . Fall 02/2018  . High cholesterol   . Irritable bowel syndrome (IBS)   . Kidney stone   . Knee pain    right  . Sciatic pain   . Stroke ([place]) 09/2017  . Subarachnoid hemorrhage ([place])   . Trigeminal neuralgia   . Trigeminal neuralgia of right side of face   . Trigeminal neuralgia syndrome   . Vertigo      Social History   Socioeconomic History  . Marital status: Married    Spouse name: Not on file  . Number of children: 3  . Years of education: 67  . Highest education level: Not on file  Occupational History  . Not on file  Tobacco Use  . Smoking status: Former Smoker    Types: Cigarettes  . Smokeless tobacco:  Never Used  Vaping Use  . Vaping Use: Never used  Substance and Sexual Activity  . Alcohol use: No  . Drug use: No  . Sexual activity: Not Currently    Birth control/protection: Post-menopausal  Other Topics Concern  . Not on file  Social History Narrative   Married 62 years.   Has three children, six grandchildren, 5 great grandchildren    Enjoys going to church, baking, shop.   Caffeine: occasional tea when eating out   Right-handed   Lives at home with husband. Has a caregiver during the week for 4 hours a day (not overnight).   Social Determinants of Health   Financial Resource Strain:   . Difficulty of  Paying Living Expenses: Not on file  Food Insecurity:   . Worried About Charity fundraiser in the Last Year: Not on file  . Ran Out of Food in the Last Year: Not on file  Transportation Needs:   . Lack of Transportation (Medical): Not on file  . Lack of Transportation (Non-Medical): Not on file  Physical Activity:   . Days of Exercise per Week: Not on file  . Minutes of Exercise per Session: Not on file  Stress:   . Feeling of Stress : Not on file  Social Connections:   . Frequency of Communication with Friends and Family: Not on file  . Frequency of Social Gatherings with Friends and Family: Not on file  . Attends Religious Services: Not on file  . Active Member of Clubs or Organizations: Not on file  . Attends Archivist Meetings: Not on file  . Marital Status: Not on file  Intimate Partner Violence:   . Fear of Current or Ex-Partner: Not on file  . Emotionally Abused: Not on file  . Physically Abused: Not on file  . Sexually Abused: Not on file    Past Surgical History:  Procedure Laterality Date  . BREAST BIOPSY Left   . BREAST SURGERY     biopsy in 1990   . CATARACT EXTRACTION    . COLONOSCOPY  05/27/2008   QVZ:DGLOVFIEPP rectal polyp,s/p bx/sigmoid diverticula. hyperplastic polyps. benign random bx  . ESOPHAGOGASTRODUODENOSCOPY  05/27/2008   IRJ:JOACZY/SA[facility]  . EYE SURGERY    . FOOT SURGERY     Left foot, metal plate  . HYSTEROSCOPY WITH D & C N/A 09/03/2014   Procedure: DILATATION AND CURETTAGE /HYSTEROSCOPY;  Surgeon: Donnamae Jude, MD;  Location: [facility];  Service: Gynecology;  Laterality: N/A;  Requesting 09/03/14 @ 2:00p  . HYSTEROSCOPY WITH D & C N/A 08/19/2015   Procedure: DILATATION AND CURETTAGE /HYSTEROSCOPY;  Surgeon: Donnamae Jude, MD;  Location: [facility];  Service: Gynecology;  Laterality: N/A;  . LITHOTRIPSY    . OTHER SURGICAL HISTORY     Uterine Prolapse/Tacking  . PROLAPSED UTERINE FIBROID LIGATION     in 1960  . TOOTH EXTRACTION  09/2017     Family History  Problem Relation Age of Onset  . Diabetes Mother   . Heart disease Mother        MI  . Uterine cancer Mother   . Stroke Father   . Cancer Paternal Grandmother        intestinal  . Stomach cancer Maternal Grandmother   . Colon cancer Neg Hx   . Breast cancer Neg Hx     Allergies  Allergen Reactions  . Albuterol Rash  . Cefdinir Diarrhea  . Lactose Intolerance (Gi) Nausea And Vomiting and  Other (See Comments)    Patient has IBS  . Levofloxacin Nausea Only  . Other Nausea And Vomiting    No greasy foods!!  . Adhesive [Tape] Rash    Paper tape is tolerated  . Epinephrine Anxiety and Palpitations  . Latex Rash    Current Outpatient Medications on File Prior to Visit  Medication Sig Dispense Refill  . Calcium-Vitamin D-Vitamin K (CHEWABLE CALCIUM PO) Take by mouth daily.    . Cholecalciferol (D3 MAXIMUM STRENGTH) 5000 units capsule Take 5,000 Units by mouth daily with lunch.     . Cyanocobalamin (VITAMIN B-12 PO) Take by mouth daily.    Marland Kitchen gabapentin (NEURONTIN) 100 MG capsule Take 1 capsule (100 mg total) by mouth at bedtime. For back pain. 90 capsule 1  . hyoscyamine (LEVBID) 0.375 MG 12 hr tablet Take 1 tablet (0.375 mg total) by mouth every morning. 90 tablet 3  . Lacosamide (VIMPAT) 100 MG TABS Take 1 tablet (100 mg total) by mouth 2 (two) times daily. Appointment needed for further refills. 180 tablet 1  . Liniments (BLUE-EMU SUPER STRENGTH) CREA Apply 1 application topically 3 (three) times daily as needed (for knee and back pain).     . meclizine (ANTIVERT) 12.5 MG tablet TAKE 1 TABLET (12.5 MG TOTAL) BY MOUTH 3 (THREE) TIMES DAILY AS NEEDED FOR DIZZINESS. (Patient taking differently: Take 12.5 mg by mouth 3 (three) times daily as needed for dizziness. Family cuts into 4ths) 30 tablet 0  . OXcarbazepine (TRILEPTAL) 150 MG tablet TAKE 1 TABLET (150 MG TOTAL) BY MOUTH 2 (TWO) TIMES DAILY. 60 tablet 5  . pantoprazole (PROTONIX) 20 MG tablet Take 1 tablet  (20 mg total) by mouth 2 (two) times daily. 180 tablet 3  . Probiotic Product (PROBIOTIC PO) Take 1 capsule by mouth daily.    . rosuvastatin (CRESTOR) 5 MG tablet TAKE ONE TABLET BY MOUTH ON MONDAY, WEDNESDAY AND FRIDAY AT BEDTIME FOR CHOLESTEROL 13 tablet 3   No current facility-administered medications on file prior to visit.    BP 120/78   Pulse 65   Ht 4\' 11"  (1.499 m)   Wt 155 lb (70.3 kg)   SpO2 97%   BMI 31.31 kg/m    Objective:   Physical Exam Constitutional:      Appearance: Normal appearance.  Pulmonary:     Effort: Pulmonary effort is normal.  Neurological:     Mental Status: She is alert.     Comments: Oriented and answers questions, but does believe that the prior caregivers were sexually interested in her husband  Psychiatric:        Mood and Affect: Mood normal.            Assessment & Plan:

## 2019-10-21 NOTE — Patient Instructions (Addendum)
Stop by the lab prior to leaving today. I will notify you of your results once received.   You will be contacted regarding your referral to social work.  Please let us know if you have not been contacted within two weeks.   I will be in touch once I hear back from Dr. Jaynee Eagles.   It was a pleasure to see you today!

## 2019-10-22 ENCOUNTER — Telehealth: Payer: Self-pay | Admitting: *Deleted

## 2019-10-22 LAB — URINE CULTURE
MICRO NUMBER:: 10893905
Result:: NO GROWTH
SPECIMEN QUALITY:: ADEQUATE

## 2019-10-22 NOTE — Telephone Encounter (Signed)
Shari Vincent, can you find this [place] 2 form they are requesting?

## 2019-10-22 NOTE — Telephone Encounter (Signed)
Spoke with Dominica Severin patients son, he will fax FL2 form.  Patient will be going to [facility] in [place], [place] fax [phone].

## 2019-10-22 NOTE — Telephone Encounter (Signed)
[name] left a voicemail stating that they need a [place] FL2 form done for Mrs. Shari Vincent. Dominica Severin stated that they have found a facility in [place] that can take the patient. Dominica Severin stated the form they need is a [facility] form.and the one that was done yesterday was for [facility]. Dominica Severin requested a call back.

## 2019-10-23 ENCOUNTER — Ambulatory Visit: Payer: Medicare PPO | Admitting: Primary Care

## 2019-10-31 ENCOUNTER — Other Ambulatory Visit: Payer: Self-pay | Admitting: Family Medicine

## 2019-10-31 ENCOUNTER — Other Ambulatory Visit: Payer: Self-pay

## 2019-10-31 ENCOUNTER — Other Ambulatory Visit: Payer: Self-pay | Admitting: Primary Care

## 2019-10-31 DIAGNOSIS — R42 Dizziness and giddiness: Secondary | ICD-10-CM

## 2019-10-31 DIAGNOSIS — M5430 Sciatica, unspecified side: Secondary | ICD-10-CM

## 2019-10-31 DIAGNOSIS — E785 Hyperlipidemia, unspecified: Secondary | ICD-10-CM

## 2019-10-31 MED ORDER — GABAPENTIN 100 MG PO CAPS: ORAL_CAPSULE | ORAL | 1 refills | Status: AC

## 2019-11-04 ENCOUNTER — Telehealth: Payer: Self-pay | Admitting: Primary Care

## 2019-11-04 NOTE — Telephone Encounter (Signed)
Please notify patient son that I did hear back from Dr. Lavell Anchors and mention this in a MyChart message to them 1 to 2 weeks ago.  See result note dated 10/23/2019.  Already checking her MyChart messages/result note? If not then we will call them from now on.

## 2019-11-04 NOTE — Telephone Encounter (Signed)
Hi Belenda Cruise, Amy Lomax last saw patient in our office so I am ccing her here. I'm not sure if Dr. Sima Matas performs virtual testing, but you could call his office and I am sure they could give you some guidance about that thanks

## 2019-11-04 NOTE — Telephone Encounter (Signed)
Form is in Kate's in box.

## 2019-11-04 NOTE — Telephone Encounter (Signed)
Spoke to son reviewed information on lab results. He does not wish for [place] to call in future just missed that note. He is concerned with the referral being to [facility] and patients ability to ride that long. Wanted to know if there is location in [place] or [place] that we can send to. If not would like to know if they do virtual visits.

## 2019-11-04 NOTE — Telephone Encounter (Signed)
Dr. Jaynee Eagles,  Thank you for getting back to me regarding Ms. Roupp's recent memory and aggressive symptoms. Her son Glendell Docker is interested in the neuropsychiatrist for diagnosis of Alzhemier's disease but is concerned about distance.   Are you aware if Dr. Sima Matas completes virtual visits? See son's concerns.   Form completed and placed in Joellen's inbox.

## 2019-11-04 NOTE — Telephone Encounter (Signed)
[name],Gary,called. At patient's last office visit, [name] said she would ask Dr.Ahern if he had any recommendations for patient and about starting Namenda.  Dominica Severin wants to know if [name] had received any information. Dominica Severin has a form from [place] for [employer] need for Assistance. Dominica Severin will fax form for [name] to sign.

## 2019-11-04 NOTE — Telephone Encounter (Signed)
Please advise 

## 2019-11-05 NOTE — Telephone Encounter (Signed)
Can we reach out to Dr. Ferne Coe office to see if they will do a virtual visit? Please also update the family on what's going on.

## 2019-11-06 NOTE — Telephone Encounter (Signed)
Called patient son and let them know that it has to be in person. Gave him address to office he will look up and let us know if they feel she will be able to make it to that location. He will call back if he would like referral placed.   Have reviewed questions on DNR completed and faxed as well.

## 2019-11-06 NOTE — Telephone Encounter (Signed)
Called office first visit needs to be in office. Will call family and see if they would like to start referral

## 2019-12-01 NOTE — Telephone Encounter (Signed)
[name], Shari Vincent, called.  He said he hasn't received the form back for Shari Vincent that he sent to [place] on 11/04/19.  Patient said his parents are in [facility] and he said the form would cut the cost in half for them to be in Assisted Living. Please call Shari Vincent back at [phone].

## 2019-12-01 NOTE — Telephone Encounter (Signed)
Per [facility], they cannot take referral for social worker. They recommend [employer] at [phone] or their website at [email or web address]  It appears that the p'ts son has now found a facility for the pt and no longer needs the additional outside support.

## 2019-12-02 ENCOUNTER — Telehealth: Payer: Self-pay | Admitting: *Deleted

## 2019-12-02 NOTE — Telephone Encounter (Signed)
Approved switch to Culturelle probiotic.

## 2019-12-02 NOTE — Telephone Encounter (Signed)
Shari Vincent with [facility] where patient is living is calling stating that the pharmacy can not get the [place] digestive health medication that patient is suppose to be on. Shari Vincent stated that the pharmacy wants to know if they can switch the patient to [place]. If this is okay they will need an order to D/C the Viactiv and new order to start the Culturelle. Fax # [phone]

## 2019-12-03 NOTE — Telephone Encounter (Signed)
Order faxed to number provided

## 2019-12-04 ENCOUNTER — Telehealth: Payer: Self-pay | Admitting: Primary Care

## 2019-12-04 NOTE — Telephone Encounter (Signed)
They need order changed form viactive to culturelle 1 tablet by mouth in evening.  She stated they have called and left several messages.  Their pharmacy does not carry viactive  They would like to get this taken care of today  Fax number [phone]  atten Darlene

## 2019-12-04 NOTE — Telephone Encounter (Signed)
Called son requested to have mailed to home address. Have verified with patient son. Copy will be sent to scan for chart.

## 2019-12-04 NOTE — Telephone Encounter (Signed)
I have re faxed order to number provided.

## 2019-12-09 ENCOUNTER — Other Ambulatory Visit: Payer: Self-pay

## 2019-12-09 ENCOUNTER — Encounter (HOSPITAL_COMMUNITY): Payer: Self-pay | Admitting: *Deleted

## 2019-12-09 DIAGNOSIS — R109 Unspecified abdominal pain: Secondary | ICD-10-CM | POA: Diagnosis not present

## 2019-12-09 DIAGNOSIS — Z20822 Contact with and (suspected) exposure to covid-19: Secondary | ICD-10-CM | POA: Diagnosis not present

## 2019-12-09 DIAGNOSIS — Z85828 Personal history of other malignant neoplasm of skin: Secondary | ICD-10-CM | POA: Insufficient documentation

## 2019-12-09 DIAGNOSIS — R300 Dysuria: Secondary | ICD-10-CM | POA: Insufficient documentation

## 2019-12-09 DIAGNOSIS — M545 Low back pain, unspecified: Secondary | ICD-10-CM | POA: Diagnosis present

## 2019-12-09 DIAGNOSIS — F0390 Unspecified dementia without behavioral disturbance: Secondary | ICD-10-CM | POA: Insufficient documentation

## 2019-12-09 DIAGNOSIS — Z87891 Personal history of nicotine dependence: Secondary | ICD-10-CM | POA: Insufficient documentation

## 2019-12-09 DIAGNOSIS — Z91040 Latex allergy status: Secondary | ICD-10-CM | POA: Insufficient documentation

## 2019-12-09 LAB — CBC WITH DIFFERENTIAL/PLATELET
Abs Immature Granulocytes: 0.04 10*3/uL (ref 0.00–0.07)
Basophils Absolute: 0 10*3/uL (ref 0.0–0.1)
Basophils Relative: 0 %
Eosinophils Absolute: 0 10*3/uL (ref 0.0–0.5)
Eosinophils Relative: 0 %
HCT: 38.1 % (ref 36.0–46.0)
Hemoglobin: 13.2 g/dL (ref 12.0–15.0)
Immature Granulocytes: 0 %
Lymphocytes Relative: 6 %
Lymphs Abs: 0.7 10*3/uL (ref 0.7–4.0)
MCH: 31.5 pg (ref 26.0–34.0)
MCHC: 34.6 g/dL (ref 30.0–36.0)
MCV: 90.9 fL (ref 80.0–100.0)
Monocytes Absolute: 0.6 10*3/uL (ref 0.1–1.0)
Monocytes Relative: 5 %
Neutro Abs: 10.3 10*3/uL — ABNORMAL HIGH (ref 1.7–7.7)
Neutrophils Relative %: 89 %
Platelets: 157 10*3/uL (ref 150–400)
RBC: 4.19 MIL/uL (ref 3.87–5.11)
RDW: 13 % (ref 11.5–15.5)
WBC: 11.7 10*3/uL — ABNORMAL HIGH (ref 4.0–10.5)
nRBC: 0 % (ref 0.0–0.2)

## 2019-12-09 LAB — URINALYSIS, MICROSCOPIC (REFLEX)
Bacteria, UA: NONE SEEN
RBC / HPF: NONE SEEN RBC/hpf (ref 0–5)

## 2019-12-09 LAB — URINALYSIS, ROUTINE W REFLEX MICROSCOPIC
Bilirubin Urine: NEGATIVE
Glucose, UA: NEGATIVE mg/dL
Hgb urine dipstick: NEGATIVE
Ketones, ur: NEGATIVE mg/dL
Nitrite: NEGATIVE
Protein, ur: NEGATIVE mg/dL
Specific Gravity, Urine: <1.005 — ABNORMAL LOW (ref 1.005–1.030)
pH: 6.5 (ref 5.0–8.0)

## 2019-12-09 LAB — BASIC METABOLIC PANEL
Anion gap: 10 (ref 5–15)
BUN: 17 mg/dL (ref 8–23)
CO2: 20 mmol/L — ABNORMAL LOW (ref 22–32)
Calcium: 9.6 mg/dL (ref 8.9–10.3)
Chloride: 105 mmol/L (ref 98–111)
Creatinine, Ser: 0.89 mg/dL (ref 0.44–1.00)
GFR, Estimated: 59 mL/min — ABNORMAL LOW (ref >60)
Glucose, Bld: 134 mg/dL — ABNORMAL HIGH (ref 70–99)
Potassium: 3.8 mmol/L (ref 3.5–5.1)
Sodium: 135 mmol/L (ref 135–145)

## 2019-12-09 NOTE — ED Triage Notes (Signed)
Pt has dementia and has been combative and c/o back pain.  Pt has been seen at [facility] and urine was obtained but has not heard back with results.  Pt is a resident at [facility] in [place].

## 2019-12-10 ENCOUNTER — Telehealth: Payer: Self-pay

## 2019-12-10 ENCOUNTER — Emergency Department (HOSPITAL_COMMUNITY): Payer: Medicare PPO

## 2019-12-10 ENCOUNTER — Emergency Department (HOSPITAL_COMMUNITY)
Admission: EM | Admit: 2019-12-10 | Discharge: 2019-12-10 | Disposition: A | Discharge status: Home / Self Care | Payer: Medicare PPO | Attending: Emergency Medicine | Admitting: Emergency Medicine

## 2019-12-10 DIAGNOSIS — M549 Dorsalgia, unspecified: Secondary | ICD-10-CM

## 2019-12-10 LAB — RESPIRATORY PANEL BY RT PCR (FLU A&B, COVID)
Influenza A by PCR: NEGATIVE
Influenza B by PCR: NEGATIVE
SARS Coronavirus 2 by RT PCR: NEGATIVE

## 2019-12-10 NOTE — Telephone Encounter (Signed)
Are you saying that the urine culture results from the assisted living facility were negative? When did these return?  What do you mean by LBP? Low blood pressure?  Which ED? In [place]? Do we have those records? Can we get that urine culture?  I reviewed the ED visit from earlier this morning, they did not complete another urine culture which makes sense.  If she has a negative culture, then she does not have a UTI and antibiotics aren't warranted.   She did have a flu shot recently, correct?

## 2019-12-10 NOTE — ED Provider Notes (Signed)
[facility] EMERGENCY DEPARTMENT Provider Note   CSN: [id number] Arrival date & time: 12/09/19  1907   Time seen 3:05 AM  History Chief Complaint  Patient presents with  . Dysuria   Level five caveat for dementia  Shari Vincent is a 86 y.o. female.  HPI   Daughter states her mother and father just moved into a assisted living facility in September.  She states her mother has not been officially diagnosed with dementia but she has all the symptoms.  She states week ago she was taken to [facility] emergency department with complaints of back pain.  They stated the urine was normal but they have not heard back from the urine culture.  Her nursing facility did a urinalysis yesterday but they do not have that result back.  Daughter states her father called her this evening that the patient was complaining of severe back pain.  She had been fine when the daughter visited her earlier in the evening.  She is unaware of vomiting or abdominal pain.  She states her mother has seemed disoriented which she had in December and April when she had a UTI.  She had a Covid test that was negative last week.  Patient had Moderna Covid vaccine in January and February.  She states that her father stated they had some type of shot today possibly influenza.  PCP Pleas Koch, NP   Past Medical History:  Diagnosis Date  . Acid reflux   . Acute cerebrovascul insuff, transient focal neurologic signs/symptoms 06/27/2016  . Arthritis    right knee  . Bulging lumbar disc   . Cancer ([place])    Basal Cell on right shoulder  . Colon polyps   . Concussion with brief loss of consciousness 03/08/2018  . Diverticulitis   . Fall 02/2018  . High cholesterol   . Irritable bowel syndrome (IBS)   . Kidney stone   . Knee pain    right  . Sciatic pain   . Stroke ([place]) 09/2017  . Subarachnoid hemorrhage ([place])   . Trigeminal neuralgia   . Trigeminal neuralgia of right side of face   . Trigeminal neuralgia  syndrome   . Vertigo     Patient Active Problem List   Diagnosis Date Noted  . Acute cystitis 02/11/2019  . Memory changes 02/11/2019  . Syncope 03/07/2018  . TIA (transient ischemic attack) 09/27/2017  . Preventative health care 12/13/2016  . Urinary incontinence 10/04/2016  . Family hx-stroke 06/27/2016  . Cerebral amyloid angiopathy (CODE) 06/26/2016  . 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 Subarachnoid hemorrhage ([place]) 06/25/2016  . Upper extremity weakness 06/23/2016  . Osteoporosis 10/22/2014  . Pelvic mass in female 08/25/2014  . PMB (postmenopausal bleeding) 08/06/2014  . Medicare annual wellness visit, subsequent 06/17/2014  . IBS (irritable bowel syndrome) 05/26/2014  . Right-sided low back pain with right-sided sciatica 05/26/2014  . Trigeminal neuralgia 05/26/2014  . OA (osteoarthritis) of knee 07/23/2012  . Venous stasis 07/23/2012  . COLONIC POLYPS, HYPERPLASTIC 08/14/2008  . Hyperlipidemia 08/14/2008  . GERD 08/14/2008  . VERTIGO 08/14/2008    Past Surgical History:  Procedure Laterality Date  . BREAST BIOPSY Left   . BREAST SURGERY     biopsy in 1990   . CATARACT EXTRACTION    . COLONOSCOPY  05/27/2008   [id number] rectal polyp,s/p bx/sigmoid diverticula. hyperplastic polyps. benign random bx  . ESOPHAGOGASTRODUODENOSCOPY  05/27/2008   GYJ:EHUDJS/[facility]  . EYE SURGERY    . FOOT SURGERY  Left foot, metal plate  . HYSTEROSCOPY WITH D & C N/A 09/03/2014   Procedure: DILATATION AND CURETTAGE /HYSTEROSCOPY;  Surgeon: Donnamae Jude, MD;  Location: [facility];  Service: Gynecology;  Laterality: N/A;  Requesting 09/03/14 @ 2:00p  . HYSTEROSCOPY WITH D & C N/A 08/19/2015   Procedure: DILATATION AND CURETTAGE /HYSTEROSCOPY;  Surgeon: Donnamae Jude, MD;  Location: [facility];  Service: Gynecology;  Laterality: N/A;  . LITHOTRIPSY    . OTHER SURGICAL HISTORY     Uterine Prolapse/Tacking  . PROLAPSED UTERINE FIBROID LIGATION     in 1960  . TOOTH EXTRACTION  09/2017       OB History    Gravida  5   Para  3   Term      Preterm  3   AB  2   Living  3     SAB  2   TAB      Ectopic      Multiple      Live Births              Family History  Problem Relation Age of Onset  . Diabetes Mother   . Heart disease Mother        MI  . Uterine cancer Mother   . Stroke Father   . Cancer Paternal Grandmother        intestinal  . Stomach cancer Maternal Grandmother   . Colon cancer Neg Hx   . Breast cancer Neg Hx     Social History   Tobacco Use  . Smoking status: Former Smoker    Types: Cigarettes  . Smokeless tobacco: Never Used  Vaping Use  . Vaping Use: Never used  Substance Use Topics  . Alcohol use: No  . Drug use: No  lives with spouse Lives in [place] Medications Prior to Admission medications   Medication Sig Start Date End Date Taking? Authorizing Provider  Calcium-Vitamin D-Vitamin K (CHEWABLE CALCIUM PO) Take by mouth daily.    [provider]  Cholecalciferol (D3 MAXIMUM STRENGTH) 5000 units capsule Take 5,000 Units by mouth daily with lunch.     [provider]  Cyanocobalamin (VITAMIN B-12 PO) Take by mouth daily.    [provider]  gabapentin (NEURONTIN) 100 MG capsule TAKE ONE CAPSULE BY MOUTH AT BEDTIME FOR BACK PAIN 10/31/19   Pleas Koch, NP  hyoscyamine (LEVBID) 0.375 MG 12 hr tablet Take 1 tablet (0.375 mg total) by mouth every morning. 05/30/19   Annitta Needs, NP  Lacosamide (VIMPAT) 100 MG TABS Take 1 tablet (100 mg total) by mouth 2 (two) times daily. Appointment needed for further refills. 06/30/19   Kathrynn Ducking, MD  Liniments (BLUE-EMU SUPER STRENGTH) CREA Apply 1 application topically 3 (three) times daily as needed (for knee and back pain).     [provider]  meclizine (ANTIVERT) 12.5 MG tablet TAKE 1 TABLET (12.5 MG TOTAL) BY MOUTH 3 (THREE) TIMES DAILY AS NEEDED FOR DIZZINESS. 10/31/19   Pleas Koch, NP  OXcarbazepine (TRILEPTAL) 150 MG  tablet TAKE 1 TABLET (150 MG TOTAL) BY MOUTH 2 (TWO) TIMES DAILY. 10/09/19   Lomax, Amy, NP  pantoprazole (PROTONIX) 20 MG tablet Take 1 tablet (20 mg total) by mouth 2 (two) times daily. 05/30/19   Annitta Needs, NP  Probiotic Product (PROBIOTIC PO) Take 1 capsule by mouth daily.    [provider]  rosuvastatin (CRESTOR) 5 MG tablet TAKE ONE TABLET  BY MOUTH ON MONDAY, [facility] AND FRIDAY AT BEDTIME FOR CHOLESTEROL 10/31/19   Pleas Koch, NP    Allergies    Albuterol, Cefdinir, Lactose intolerance (gi), Levofloxacin, Other, Adhesive [tape], Epinephrine, and Latex  Review of Systems   Review of Systems  Unable to perform ROS: Dementia    Physical Exam Updated Vital Signs BP (!) 125/53   Pulse 72   Temp 98.2 F (36.8 C) (Oral)   Resp 16   Ht 4\' 11"  (1.499 m)   Wt 68 kg   SpO2 98%   BMI 30.30 kg/m   Physical Exam Vitals and nursing note reviewed.  Constitutional:      General: She is not in acute distress.    Appearance: She is obese. She is not toxic-appearing.     Comments: Patient was sleeping, easily awakened  HENT:     Head: Normocephalic and atraumatic.     Right Ear: External ear normal.     Left Ear: External ear normal.  Eyes:     Extraocular Movements: Extraocular movements intact.     Conjunctiva/sclera: Conjunctivae normal.     Pupils: Pupils are equal, round, and reactive to light.  Cardiovascular:     Rate and Rhythm: Normal rate and regular rhythm.     Pulses: Normal pulses.     Heart sounds: Normal heart sounds.  Pulmonary:     Effort: Pulmonary effort is normal. No respiratory distress.     Breath sounds: Normal breath sounds.     Comments: Patient is noted to have a cough throughout my interview Abdominal:     General: Abdomen is flat. Bowel sounds are normal.     Palpations: Abdomen is soft.     Tenderness: There is no abdominal tenderness.  Musculoskeletal:     Cervical back: Normal range of motion.     Comments: Patient appears to  have some diffuse lumbar tenderness and right flank pain, she seems less tender over the left flank.  Skin:    General: Skin is warm and dry.  Neurological:     Comments: Moves her arms and legs well  Psychiatric:        Mood and Affect: Affect is flat.        Speech: She is noncommunicative.     ED Results / Procedures / Treatments   Labs (all labs ordered are listed, but only abnormal results are displayed) Results for orders placed or performed during the hospital encounter of 12/10/19  Respiratory Panel by RT PCR (Flu A&B, Covid) - Nasopharyngeal Swab   Specimen: Nasopharyngeal Swab  Result Value Ref Range   SARS Coronavirus 2 by RT PCR NEGATIVE NEGATIVE   Influenza A by PCR NEGATIVE NEGATIVE   Influenza B by PCR NEGATIVE NEGATIVE  Urinalysis, Routine w reflex microscopic  Result Value Ref Range   Color, Urine STRAW (A) YELLOW   APPearance CLEAR CLEAR   Specific Gravity, Urine <1.005 (L) 1.005 - 1.030   pH 6.5 5.0 - 8.0   Glucose, UA NEGATIVE NEGATIVE mg/dL   Hgb urine dipstick NEGATIVE NEGATIVE   Bilirubin Urine NEGATIVE NEGATIVE   Ketones, ur NEGATIVE NEGATIVE mg/dL   Protein, ur NEGATIVE NEGATIVE mg/dL   Nitrite NEGATIVE NEGATIVE   Leukocytes,Ua TRACE (A) NEGATIVE  CBC with Differential  Result Value Ref Range   WBC 11.7 (H) 4.0 - 10.5 K/uL   RBC 4.19 3.87 - 5.11 MIL/uL   Hemoglobin 13.2 12.0 - 15.0 g/dL   HCT 38.1 36 - 46 %  MCV 90.9 80.0 - 100.0 fL   MCH 31.5 26.0 - 34.0 pg   MCHC 34.6 30.0 - 36.0 g/dL   RDW 13.0 11.5 - 15.5 %   Platelets 157 150 - 400 K/uL   nRBC 0.0 0.0 - 0.2 %   Neutrophils Relative % 89 %   Neutro Abs 10.3 (H) 1.7 - 7.7 K/uL   Lymphocytes Relative 6 %   Lymphs Abs 0.7 0.7 - 4.0 K/uL   Monocytes Relative 5 %   Monocytes Absolute 0.6 0.1 - 1.0 K/uL   Eosinophils Relative 0 %   Eosinophils Absolute 0.0 0.0 - 0.5 K/uL   Basophils Relative 0 %   Basophils Absolute 0.0 0.0 - 0.1 K/uL   Immature Granulocytes 0 %   Abs Immature  Granulocytes 0.04 0.00 - 0.07 K/uL  Basic metabolic panel  Result Value Ref Range   Sodium 135 135 - 145 mmol/L   Potassium 3.8 3.5 - 5.1 mmol/L   Chloride 105 98 - 111 mmol/L   CO2 20 (L) 22 - 32 mmol/L   Glucose, Bld 134 (H) 70 - 99 mg/dL   BUN 17 8 - 23 mg/dL   Creatinine, Ser 0.89 0.44 - 1.00 mg/dL   Calcium 9.6 8.9 - 10.3 mg/dL   GFR, Estimated 59 (L) >60 mL/min   Anion gap 10 5 - 15  Urinalysis, Microscopic (reflex)  Result Value Ref Range   RBC / HPF NONE SEEN 0 - 5 RBC/hpf   WBC, UA 0-5 0 - 5 WBC/hpf   Bacteria, UA NONE SEEN NONE SEEN   Squamous Epithelial / LPF 0-5 0 - 5   Laboratory interpretation all normal except mild leukocytosis    EKG None  Radiology DG Chest Port 1 View  Result Date: 12/10/2019 CLINICAL DATA:  Fever and cough EXAM: PORTABLE CHEST 1 VIEW COMPARISON:  03/07/2018 FINDINGS: Low volume chest. Accentuated markings at the bases, also noted previously. No edema, effusion, air bronchogram, or pneumothorax. Normal heart size and mediastinal contours. IMPRESSION: No acute finding when compared to prior. Electronically Signed   By: Monte Fantasia M.D.   On: 12/10/2019 04:15   CT Renal Stone Study  Result Date: 12/10/2019 CLINICAL DATA:  86 year old female with flank pain. Concern for kidney stone. EXAM: CT ABDOMEN AND PELVIS WITHOUT CONTRAST TECHNIQUE: Multidetector CT imaging of the abdomen and pelvis was performed following the standard protocol without IV contrast. COMPARISON:  None. FINDINGS: Evaluation of this exam is limited in the absence of intravenous contrast. Lower chest: Minimal bibasilar dependent atelectasis. The visualized lung bases are otherwise clear. There is coronary vascular calcification. No intra-abdominal free air or free fluid. Hepatobiliary: The liver is unremarkable. No intrahepatic biliary ductal dilatation. The gallbladder is unremarkable. Pancreas: Unremarkable. No pancreatic ductal dilatation or surrounding inflammatory changes.  Spleen: Normal in size without focal abnormality. Adrenals/Urinary Tract: The adrenal glands unremarkable. There is a 2 cm left renal upper pole hypodense lesion which is suboptimally characterized on this noncontrast CT but demonstrates fluid attenuation most consistent with a cyst. There is no hydronephrosis or nephrolithiasis on either side. Faint subcentimeter right renal upper pole hypodense focus as well as a subcentimeter right renal inferior pole high attenuating lesion are not characterized. Ultrasound may provide better evaluation of the kidneys on a nonemergent/outpatient basis. The visualized ureters and urinary bladder appear unremarkable. Stomach/Bowel: Several distal duodenal diverticula measure up to 3 cm. No active inflammatory changes. There is sigmoid diverticulosis without active inflammatory changes. There is moderate stool throughout the  colon. There is a small hiatal hernia. There is no bowel obstruction or active inflammation. The appendix is not visualized with certainty. No inflammatory changes identified in the right lower quadrant. Vascular/Lymphatic: Advanced aortoiliac atherosclerotic disease. The IVC is unremarkable. No portal venous gas. There is no adenopathy. Reproductive: A 5.6 x 4.7 cm with solid lesion adjacent to the uterus likely represents an exophytic fibroid. Further evaluation with pelvic ultrasound on a nonemergent/outpatient basis recommended. Other: None Musculoskeletal: Degenerative changes of the spine. No acute osseous pathology. IMPRESSION: 1. No acute intra-abdominal or pelvic pathology. No hydronephrosis or nephrolithiasis. 2. Duodenal and sigmoid diverticulosis. No bowel obstruction. 3. Probable uterine fibroid. Further evaluation with pelvic ultrasound recommended. 4. Aortic Atherosclerosis (ICD10-I70.0). Electronically Signed   By: Anner Crete M.D.   On: 12/10/2019 03:56    Procedures Procedures (including critical care time)  Medications Ordered in  ED Medications - No data to display  ED Course  I have reviewed the triage vital signs and the nursing notes.  Pertinent labs & imaging results that were available during my care of the patient were reviewed by me and considered in my medical decision making (see chart for details).    MDM Rules/Calculators/A&P                          Patient presents with low-grade fever and mild leukocytosis and complaints of back pain.  CT renal was done to look for renal stone, patient was noted to have cough throughout her exam, daughter states she has had a mild cough this evening while waiting in the waiting room.  Chest x-ray was done and patient was tested for Covid.   Patient has been resting comfortably whenever I am in the room.  She did have a low-grade fever and very mild elevation of her white count.  She did get a flu vaccine today.  She supposedly had a urine culture done a week ago at [facility] emergency department and her facility also took a urine sample 2 days ago.  I did not see at any point in doing a third urine culture in a week.  Daughter points out that she thinks she got a flu vaccine today, this may be the cause of her low-grade fever and some worsening of her pain.  Patient was discharged back to her facility with her daughter.   Final Clinical Impression(s) / ED Diagnoses Final diagnoses:  Back pain, unspecified back location, unspecified back pain laterality, unspecified chronicity     Rx / DC Orders ED Discharge Orders    None    OTC acetaminophen   Plan discharge  Rolland Porter, MD, Abram SanderRolland Porter, MD 12/10/19 [phone]

## 2019-12-10 NOTE — Telephone Encounter (Signed)
Sent over urine culture results did not grow anything but patient is have LBP with confusion and aggressive behavior. Family states topical behavior with UTI. Has been seen in ED twice last few days. Denies any fever nausea or vomiting. Would like to know if you can call in abx to treat for UTI.

## 2019-12-10 NOTE — Discharge Instructions (Addendum)
Please call Dr. Ainsley Spinner office to have them check the urine culture results from [facility] ED a week ago and the facility that was done a couple of days ago.  She can have acetaminophen 500 mg every 6 hours as needed for pain.  Recheck if she gets a high fever or seems worse.

## 2019-12-11 LAB — URINE CULTURE: Culture: <10000 — AB

## 2019-12-11 NOTE — Telephone Encounter (Signed)
Results are in your box for review. Patient is only having low back pain not low blood pressure. Culture was not done at either visit due to one pending.   Do not see flu in chart.

## 2019-12-11 NOTE — Telephone Encounter (Signed)
If negative culture then she does not have UTI. If family is concerned about her behavior then it may be best to reach out to her neurologist.  Back pain could be muscular, could try topical agents, heating pad, Tylenol.

## 2019-12-12 NOTE — Telephone Encounter (Signed)
Left message to return call to our office.  

## 2019-12-16 NOTE — Telephone Encounter (Signed)
Left message to return call to our office.  

## 2019-12-18 NOTE — Telephone Encounter (Signed)
Have called pt x 3 l/m to call office. The nurse has been made aware of information. Ok to send letter?

## 2019-12-18 NOTE — Telephone Encounter (Signed)
I spoke with her daughter last Friday evening. They are aware of negative culture results.

## 2019-12-22 ENCOUNTER — Other Ambulatory Visit: Payer: Self-pay | Admitting: *Deleted

## 2019-12-22 MED ORDER — VIMPAT 100 MG PO TABS: 1.0000 | ORAL_TABLET | Freq: Two times a day (BID) | ORAL | 1 refills | Status: AC

## 2019-12-22 NOTE — Telephone Encounter (Addendum)
Received Vimpat 100 mg BID refill request from [facility]. I called the pharmacy. Pt had 54 tablets left on Rx and this was filled on 12/19/19 and sent to [facility] as  27 day supply. They need further refills. Will send to Amy NP to authorize and place note that next refill date should be 01/15/20.

## 2019-12-22 NOTE — Telephone Encounter (Signed)
Also please note I checked the drug registry which reflected the 12/19/19 27 tablet refill.

## 2019-12-23 ENCOUNTER — Other Ambulatory Visit: Payer: Self-pay

## 2019-12-24 MED ORDER — HYOSCYAMINE SULFATE ER 0.375 MG PO TB12: 0.3750 mg | ORAL_TABLET | Freq: Every morning | ORAL | 5 refills | Status: AC

## 2020-01-05 ENCOUNTER — Telehealth: Payer: Self-pay | Admitting: *Deleted

## 2020-01-05 NOTE — Telephone Encounter (Signed)
Hillery Hunter at [facility] called stating that patient has covid and they faxed over an order to give the patient the covid antibiotic monoclonal  injections 4 subq tomorrow in house. Darlene stated that they faxed the order over yesterday. Darlene stated that she also sent over an order to see if [name] wanted to start the patient on a Zpak and prednisone because that is what they are starting most patients on that has covid. Darlene stated that this is time sensitive and would like the orders faxed back today.

## 2020-01-05 NOTE — Telephone Encounter (Signed)
Okay to proceed with monoclonal antibody injections, will place fax in [place]. Does she have any symptoms of Covid?  Shortness of breath, cough, hypoxia?  If she has shortness of breath then okay to administer prednisone per facility protocol.

## 2020-01-06 NOTE — Telephone Encounter (Signed)
Please sign and close encounter when completed. 

## 2020-01-06 NOTE — Telephone Encounter (Signed)
Called and spoke to [place]. Patient is having cough but not Sob at this time. Would like order faxed if she start to have SOB they can start prednisone. They also did not get fax this morning will re fax to another number she provided with updated prednisone order now.

## 2020-01-08 ENCOUNTER — Telehealth: Payer: Self-pay | Admitting: Family Medicine

## 2020-01-08 DIAGNOSIS — R413 Other amnesia: Secondary | ICD-10-CM

## 2020-01-08 DIAGNOSIS — S22000A Wedge compression fracture of unspecified thoracic vertebra, initial encounter for closed fracture: Secondary | ICD-10-CM

## 2020-01-08 DIAGNOSIS — R55 Syncope and collapse: Secondary | ICD-10-CM

## 2020-01-08 MED ORDER — ALPRAZOLAM 0.25 MG PO TABS: 0.2500 mg | ORAL_TABLET | Freq: Every evening | ORAL | 0 refills | Status: AC | PRN

## 2020-01-08 MED ORDER — TRAMADOL HCL 50 MG PO TABS: 50.0000 mg | ORAL_TABLET | Freq: Three times a day (TID) | ORAL | 0 refills | Status: AC | PRN

## 2020-01-08 NOTE — Telephone Encounter (Signed)
Let her know that I have called in a low dose of alprazolam (0.25mg ) to be used as needed for agitation. Please remind them to use only as needed as this medication can make her groggy and suppress respiratory drive. Have them monitor symptoms closely and let us know if agitation becomes more of a consistent problem. Have them follow up very closely with PCP for pain mangement and monitoring for COVID complications. Do not give alprazolam if she is having any trouble breathing.

## 2020-01-08 NOTE — Telephone Encounter (Signed)
I called daughter back relayed that xanax 0.25mg  po prn qhs (agitation/anxiety) may help with her during care activites, but she relayed that her brother was working on the other tramadol pain issue with other porvider and was able to get tramadol.  I told her to explain to AL that causes respiratory depression and her having COVID to watch for this if she is given this.  Would try tramadol first and see how she does. She stated would relay to [facility] facility.  She asked abot pallative care.

## 2020-01-08 NOTE — Telephone Encounter (Signed)
Shari Vincent (daughter on [place]) asking to discuss health and concerns for pt.  Pt currently in [place] in [place]

## 2020-01-08 NOTE — Telephone Encounter (Signed)
Joellen, can we get these hospital records?

## 2020-01-08 NOTE — Addendum Note (Signed)
Addended by: Debbora Presto L on: 01/08/2020 03:33 PM   Modules accepted: Orders

## 2020-01-08 NOTE — Telephone Encounter (Signed)
[place] and nursing at [facility] in [place] said pt has a compression fx and pt pain level Is 8; tylenol not helping pain. Pt was referred to ortho in [place] but pts family wants pt to go to [place]. No ortho referral yet. Lonn [place] wants to know if Gentry Fitz NP will send in pain med to [facility] in [place].  Kayla request cb after reviewed by Gentry Fitz NP.

## 2020-01-08 NOTE — Telephone Encounter (Signed)
I called daughter of pt. Pt is now living in [place] with husband,  Both husband and pt have covid, cough and congestion. She had fall went to ED on Monday. Has s T12 compression fx.  Was not placed on medications from ED not sure why.  Will be seeing surgery at some point for procedure??  AL RN contacting pcp about this.   Daughter is questioning [place] after speaking to pcp previously about pts agitation/ fighting staff scratching/biting when they are trying to help her (in daily care). She wondered if the crebral amyloid angiography is dementia ALZ?  Would palliative care or hospice be helpful for pt or needed at this time.  And need medication to assist in her not lashing out (would need outweigh digestive issues)?  Please advise.

## 2020-01-12 NOTE — Telephone Encounter (Signed)
Yes, please do not mix tramadol with alprazolam. Palliative care may be an option. PCP should be able to refer is they wish. TY.

## 2020-01-22 DIAGNOSIS — S22080A Wedge compression fracture of T11-T12 vertebra, initial encounter for closed fracture: Secondary | ICD-10-CM | POA: Diagnosis not present

## 2020-01-23 DIAGNOSIS — M4854XD Collapsed vertebra, not elsewhere classified, thoracic region, subsequent encounter for fracture with routine healing: Secondary | ICD-10-CM | POA: Diagnosis not present

## 2020-01-23 DIAGNOSIS — I10 Essential (primary) hypertension: Secondary | ICD-10-CM | POA: Diagnosis not present

## 2020-01-23 DIAGNOSIS — S22080D Wedge compression fracture of T11-T12 vertebra, subsequent encounter for fracture with routine healing: Secondary | ICD-10-CM | POA: Diagnosis not present

## 2020-01-23 DIAGNOSIS — R52 Pain, unspecified: Secondary | ICD-10-CM | POA: Diagnosis not present

## 2020-01-23 DIAGNOSIS — J1282 Pneumonia due to coronavirus disease 2019: Secondary | ICD-10-CM | POA: Diagnosis not present

## 2020-01-23 DIAGNOSIS — U071 COVID-19: Secondary | ICD-10-CM | POA: Diagnosis not present

## 2020-01-23 DIAGNOSIS — U099 Post covid-19 condition, unspecified: Secondary | ICD-10-CM | POA: Diagnosis not present

## 2020-01-23 DIAGNOSIS — M549 Dorsalgia, unspecified: Secondary | ICD-10-CM | POA: Diagnosis not present

## 2020-01-23 DIAGNOSIS — F0150 Vascular dementia without behavioral disturbance: Secondary | ICD-10-CM | POA: Diagnosis not present

## 2020-01-23 DIAGNOSIS — R410 Disorientation, unspecified: Secondary | ICD-10-CM | POA: Diagnosis not present

## 2020-01-23 DIAGNOSIS — R404 Transient alteration of awareness: Secondary | ICD-10-CM | POA: Diagnosis not present

## 2020-01-23 DIAGNOSIS — J189 Pneumonia, unspecified organism: Secondary | ICD-10-CM | POA: Diagnosis not present

## 2020-01-23 DIAGNOSIS — M545 Low back pain, unspecified: Secondary | ICD-10-CM | POA: Diagnosis not present

## 2020-01-23 NOTE — Telephone Encounter (Signed)
Joellen, I placed the DME order for hospital bed and placed on your desk.  See [name]'s last message for where to fax.

## 2020-01-26 DIAGNOSIS — M545 Low back pain, unspecified: Secondary | ICD-10-CM | POA: Diagnosis not present

## 2020-01-26 DIAGNOSIS — R531 Weakness: Secondary | ICD-10-CM | POA: Diagnosis not present

## 2020-01-26 DIAGNOSIS — Z7401 Bed confinement status: Secondary | ICD-10-CM | POA: Diagnosis not present

## 2020-01-26 DIAGNOSIS — I10 Essential (primary) hypertension: Secondary | ICD-10-CM | POA: Diagnosis not present

## 2020-01-26 DIAGNOSIS — M2550 Pain in unspecified joint: Secondary | ICD-10-CM | POA: Diagnosis not present

## 2020-01-26 DIAGNOSIS — S22080A Wedge compression fracture of T11-T12 vertebra, initial encounter for closed fracture: Secondary | ICD-10-CM | POA: Diagnosis not present

## 2020-01-26 NOTE — Telephone Encounter (Signed)
Shari Vincent, can you take a look? Did you fax the hospital bed order?

## 2020-01-28 ENCOUNTER — Telehealth: Payer: Self-pay | Admitting: Primary Care

## 2020-01-28 ENCOUNTER — Telehealth: Payer: Self-pay

## 2020-01-28 NOTE — Telephone Encounter (Signed)
[facility] Night - Client Nonclinical Telephone Record AccessNurse Client [facility] Night - Client Client Site [place] Physician [name] - NP Contact Type Call Who Is Calling Patient / Member / Family / Caregiver Caller Name [place] Phone Number [phone] Patient Name Shari Vincent Patient DOB 09/16/1931 Call Type Message Only Information Provided Reason for Call Request for General Office Information Initial Comment Caller states, mom had sent a questionnaire to Dr. Carlis Abbott, Please complete questionnaire and on form pls show she does not needs another X-Ray. Disp. Time Disposition Final User 01/27/2020 5:09:44 PM General Information Provided Yes Jobie Quaker Call Closed By: Jobie Quaker Transaction Date/Time: 01/27/2020 5:05:48 PM (ET)

## 2020-01-28 NOTE — Telephone Encounter (Signed)
Pt rep called in about paperwork that she received.

## 2020-01-28 NOTE — Telephone Encounter (Signed)
Have called and spoke to daughter. All information reviewed and documented in phone note.

## 2020-01-28 NOTE — Telephone Encounter (Signed)
Ok to send order. 

## 2020-01-28 NOTE — Telephone Encounter (Signed)
Called daughter let her know ppw has been faxed today. She will call if any issues.

## 2020-01-29 NOTE — Telephone Encounter (Signed)
Spoke to daughter needs order sent for PPD testing message has been sent to [facility] for ok.

## 2020-01-29 NOTE — Telephone Encounter (Signed)
Have placed in your folder for review.

## 2020-01-29 NOTE — Telephone Encounter (Signed)
Apolonio Schneiders from [facility] called about why she needed a hospital bed for insurance purposes. Fax number [phone]

## 2020-01-30 DIAGNOSIS — R55 Syncope and collapse: Secondary | ICD-10-CM | POA: Diagnosis not present

## 2020-01-30 DIAGNOSIS — S22000A Wedge compression fracture of unspecified thoracic vertebra, initial encounter for closed fracture: Secondary | ICD-10-CM | POA: Diagnosis not present

## 2020-01-30 DIAGNOSIS — R2689 Other abnormalities of gait and mobility: Secondary | ICD-10-CM | POA: Diagnosis not present

## 2020-01-30 NOTE — Telephone Encounter (Signed)
Where do I document for the hospital bed need?

## 2020-01-30 NOTE — Telephone Encounter (Signed)
Called and spoke with facility every thing is arranged and the bed should arrive to day. No further information needed. For that as far as she is aware.

## 2020-01-30 NOTE — Telephone Encounter (Signed)
Faxed to provided number  

## 2020-01-30 NOTE — Telephone Encounter (Signed)
Spoke with patient's daughter via phone, notified her that the our fax from earlier failed.  I faxed all 11 pages to correct number, fax confirmation received, daughter notified.

## 2020-01-30 NOTE — Telephone Encounter (Signed)
Fax [phone] - paperwork to physical and  med list

## 2020-02-02 NOTE — Telephone Encounter (Signed)
[facility] - Client TELEPHONE ADVICE RECORD [facility] Patient Name: Shari Vincent Gender: Female DOB: [birth year] Age: 86 Y 30 M 14 D Return Phone Number: [phone] (Primary) Address: City/State/Zip: [place] [address] Client [facility] - Client Client Site [facility] [name] - NP Contact Type Call Who Is Calling Patient / Member / Family / Caregiver Call Type Triage / Clinical Caller Name Shanik Brookshire Relationship To Patient Son Return Phone Number [phone] (Primary) Chief Complaint Paging or Request for Consult Reason for Call Request to Speak to a Physician Initial Comment Caller says that he sent paperwork to the office two weeks ago. His mother is being moved to memory care tomorrow, but he has been calling the office all day, and Di Kindle or something like that promised to send the paperwork to the facility before they closed but it has not been received. He is fed up with this office. He wants somebody reached right now to take care of it. He has to move his mother tomorrow. Translation No Nurse Assessment Nurse: Martyn Ehrich, RN, Felicia Date/Time (Eastern Time): 01/30/2020 5:41:27 PM Confirm and document reason for call. If symptomatic, describe symptoms. ---Son said Mom is supposed to be admitted to [facility] [place], [place] in the am and the paper work has not been faxed to the facility and he spoke with Mechele Claude in the office who said she would fax it and it is not there. They also sent the paperwork to office 2 wks ago. Pt has no new symptoms. We agreed nurse will call on call and call him back within 30 min. Did not ask the rest of the nursing assessment questions Does the patient have any new or worsening symptoms? ---No Disp. Time Eilene Ghazi Time) Disposition Final User 01/30/2020 5:44:28 PM Called On-Call Provider [place],  RN, [facility] 20/11/710 1:97:58 PM Clinical Call Yes Martyn Ehrich, RN, Felicia PLEASE NOTE: All timestamps contained within this report are represented as [place] Standard Time. CONFIDENTIALTY NOTICE: This fax transmission is intended only for the addressee. It contains information that is legally privileged, confidential or otherwise protected from use or disclosure. If you are not the intended recipient, you are strictly prohibited from reviewing, disclosing, copying using or disseminating any of this information or taking any action in reliance on or regarding this information. If you have received this fax in error, please notify us immediately by telephone so that we can arrange for its return to [place]. Phone: [phone], Toll-Free: [phone], Fax: [phone] Page: 2 of 2 Call Id: [id number] Paging DoctorName Phone DateTime Result/Outcome Message Type Notes Howard Pouch [phone] 01/30/2020 5:44:28 PM Called On Call Provider - Reached Doctor Paged Howard Pouch 01/30/2020 5:46:44 PM Spoke with On Call - General Message Result she cant do anything about it. She is not in office unfortunately and not this persons MD so they will have to call the office Monday. Gave this input to the son.

## 2020-02-02 NOTE — Telephone Encounter (Signed)
Noted  

## 2020-02-02 NOTE — Telephone Encounter (Signed)
From this pt message appears paperwork received. FYI to Gentry Fitz NP.

## 2020-02-05 ENCOUNTER — Telehealth: Payer: Self-pay

## 2020-02-05 NOTE — Telephone Encounter (Signed)
Called and l/m for daughter to call office. I will send information in my chart message so patient does not need to call back and all information is available for all children to review. We have faxed information to get insurance approval if not what needed they will let us know. We may need to schedule a face to face to have every thing documented for insurance.

## 2020-02-09 DIAGNOSIS — Z79899 Other long term (current) drug therapy: Secondary | ICD-10-CM | POA: Diagnosis not present

## 2020-02-09 DIAGNOSIS — E559 Vitamin D deficiency, unspecified: Secondary | ICD-10-CM | POA: Diagnosis not present

## 2020-02-20 DIAGNOSIS — G4089 Other seizures: Secondary | ICD-10-CM | POA: Diagnosis not present

## 2020-02-20 DIAGNOSIS — S22088A Other fracture of T11-T12 vertebra, initial encounter for closed fracture: Secondary | ICD-10-CM | POA: Diagnosis not present

## 2020-02-20 DIAGNOSIS — R0681 Apnea, not elsewhere classified: Secondary | ICD-10-CM | POA: Diagnosis not present

## 2020-02-20 DIAGNOSIS — R0902 Hypoxemia: Secondary | ICD-10-CM | POA: Diagnosis not present

## 2020-02-20 DIAGNOSIS — N390 Urinary tract infection, site not specified: Secondary | ICD-10-CM | POA: Diagnosis not present

## 2020-02-20 DIAGNOSIS — R4020 Unspecified coma: Secondary | ICD-10-CM | POA: Diagnosis not present

## 2020-02-20 DIAGNOSIS — Z20822 Contact with and (suspected) exposure to covid-19: Secondary | ICD-10-CM | POA: Diagnosis not present

## 2020-02-20 DIAGNOSIS — G40909 Epilepsy, unspecified, not intractable, without status epilepticus: Secondary | ICD-10-CM | POA: Diagnosis not present

## 2020-02-20 DIAGNOSIS — I447 Left bundle-branch block, unspecified: Secondary | ICD-10-CM | POA: Diagnosis not present

## 2020-02-20 DIAGNOSIS — R404 Transient alteration of awareness: Secondary | ICD-10-CM | POA: Diagnosis not present

## 2020-03-01 DIAGNOSIS — S22000A Wedge compression fracture of unspecified thoracic vertebra, initial encounter for closed fracture: Secondary | ICD-10-CM | POA: Diagnosis not present

## 2020-03-01 DIAGNOSIS — R55 Syncope and collapse: Secondary | ICD-10-CM | POA: Diagnosis not present

## 2020-03-01 DIAGNOSIS — R2689 Other abnormalities of gait and mobility: Secondary | ICD-10-CM | POA: Diagnosis not present

## 2020-03-18 DIAGNOSIS — E559 Vitamin D deficiency, unspecified: Secondary | ICD-10-CM | POA: Diagnosis not present

## 2020-03-18 DIAGNOSIS — I680 Cerebral amyloid angiopathy: Secondary | ICD-10-CM | POA: Diagnosis not present

## 2020-03-18 DIAGNOSIS — R5383 Other fatigue: Secondary | ICD-10-CM | POA: Diagnosis not present

## 2020-03-18 DIAGNOSIS — F419 Anxiety disorder, unspecified: Secondary | ICD-10-CM | POA: Diagnosis not present

## 2020-03-18 DIAGNOSIS — F0390 Unspecified dementia without behavioral disturbance: Secondary | ICD-10-CM | POA: Diagnosis not present

## 2020-03-18 DIAGNOSIS — R55 Syncope and collapse: Secondary | ICD-10-CM | POA: Diagnosis not present

## 2020-03-18 DIAGNOSIS — D519 Vitamin B12 deficiency anemia, unspecified: Secondary | ICD-10-CM | POA: Diagnosis not present

## 2020-03-18 DIAGNOSIS — K580 Irritable bowel syndrome with diarrhea: Secondary | ICD-10-CM | POA: Diagnosis not present

## 2020-03-18 DIAGNOSIS — E539 Vitamin B deficiency, unspecified: Secondary | ICD-10-CM | POA: Diagnosis not present

## 2020-03-18 DIAGNOSIS — K219 Gastro-esophageal reflux disease without esophagitis: Secondary | ICD-10-CM | POA: Diagnosis not present

## 2020-03-18 DIAGNOSIS — R42 Dizziness and giddiness: Secondary | ICD-10-CM | POA: Diagnosis not present

## 2020-03-18 DIAGNOSIS — E7849 Other hyperlipidemia: Secondary | ICD-10-CM | POA: Diagnosis not present

## 2020-03-18 DIAGNOSIS — M1990 Unspecified osteoarthritis, unspecified site: Secondary | ICD-10-CM | POA: Diagnosis not present

## 2020-03-18 DIAGNOSIS — E785 Hyperlipidemia, unspecified: Secondary | ICD-10-CM | POA: Diagnosis not present

## 2020-04-01 DIAGNOSIS — R55 Syncope and collapse: Secondary | ICD-10-CM | POA: Diagnosis not present

## 2020-04-01 DIAGNOSIS — S22000A Wedge compression fracture of unspecified thoracic vertebra, initial encounter for closed fracture: Secondary | ICD-10-CM | POA: Diagnosis not present

## 2020-04-01 DIAGNOSIS — R2689 Other abnormalities of gait and mobility: Secondary | ICD-10-CM | POA: Diagnosis not present

## 2020-04-06 DIAGNOSIS — G459 Transient cerebral ischemic attack, unspecified: Secondary | ICD-10-CM | POA: Diagnosis not present

## 2020-04-06 DIAGNOSIS — G4089 Other seizures: Secondary | ICD-10-CM | POA: Diagnosis not present

## 2020-04-06 DIAGNOSIS — F0390 Unspecified dementia without behavioral disturbance: Secondary | ICD-10-CM | POA: Diagnosis not present

## 2020-04-06 DIAGNOSIS — N390 Urinary tract infection, site not specified: Secondary | ICD-10-CM | POA: Diagnosis not present

## 2020-04-20 DIAGNOSIS — Z79899 Other long term (current) drug therapy: Secondary | ICD-10-CM | POA: Diagnosis not present

## 2020-04-22 DIAGNOSIS — R55 Syncope and collapse: Secondary | ICD-10-CM | POA: Diagnosis not present

## 2020-04-22 DIAGNOSIS — K219 Gastro-esophageal reflux disease without esophagitis: Secondary | ICD-10-CM | POA: Diagnosis not present

## 2020-04-22 DIAGNOSIS — R82998 Other abnormal findings in urine: Secondary | ICD-10-CM | POA: Diagnosis not present

## 2020-04-22 DIAGNOSIS — K580 Irritable bowel syndrome with diarrhea: Secondary | ICD-10-CM | POA: Diagnosis not present

## 2020-04-22 DIAGNOSIS — G459 Transient cerebral ischemic attack, unspecified: Secondary | ICD-10-CM | POA: Diagnosis not present

## 2020-04-22 DIAGNOSIS — F0390 Unspecified dementia without behavioral disturbance: Secondary | ICD-10-CM | POA: Diagnosis not present

## 2020-04-22 DIAGNOSIS — M1990 Unspecified osteoarthritis, unspecified site: Secondary | ICD-10-CM | POA: Diagnosis not present

## 2020-04-22 DIAGNOSIS — R42 Dizziness and giddiness: Secondary | ICD-10-CM | POA: Diagnosis not present

## 2020-04-22 DIAGNOSIS — F419 Anxiety disorder, unspecified: Secondary | ICD-10-CM | POA: Diagnosis not present

## 2020-04-27 DIAGNOSIS — N390 Urinary tract infection, site not specified: Secondary | ICD-10-CM | POA: Diagnosis not present

## 2020-04-28 ENCOUNTER — Encounter: Payer: Self-pay | Admitting: Internal Medicine

## 2020-04-29 DIAGNOSIS — R2689 Other abnormalities of gait and mobility: Secondary | ICD-10-CM | POA: Diagnosis not present

## 2020-04-29 DIAGNOSIS — S22000A Wedge compression fracture of unspecified thoracic vertebra, initial encounter for closed fracture: Secondary | ICD-10-CM | POA: Diagnosis not present

## 2020-04-29 DIAGNOSIS — R55 Syncope and collapse: Secondary | ICD-10-CM | POA: Diagnosis not present

## 2020-05-30 DIAGNOSIS — S22000A Wedge compression fracture of unspecified thoracic vertebra, initial encounter for closed fracture: Secondary | ICD-10-CM | POA: Diagnosis not present

## 2020-05-30 DIAGNOSIS — R2689 Other abnormalities of gait and mobility: Secondary | ICD-10-CM | POA: Diagnosis not present

## 2020-05-30 DIAGNOSIS — R55 Syncope and collapse: Secondary | ICD-10-CM | POA: Diagnosis not present

## 2020-06-07 ENCOUNTER — Encounter: Payer: Self-pay | Admitting: Family Medicine

## 2020-06-07 ENCOUNTER — Ambulatory Visit: Payer: Medicare PPO | Admitting: Family Medicine

## 2020-06-10 DIAGNOSIS — E7849 Other hyperlipidemia: Secondary | ICD-10-CM | POA: Diagnosis not present

## 2020-06-10 DIAGNOSIS — R634 Abnormal weight loss: Secondary | ICD-10-CM | POA: Diagnosis not present

## 2020-06-10 DIAGNOSIS — F0390 Unspecified dementia without behavioral disturbance: Secondary | ICD-10-CM | POA: Diagnosis not present

## 2020-06-10 DIAGNOSIS — K580 Irritable bowel syndrome with diarrhea: Secondary | ICD-10-CM | POA: Diagnosis not present

## 2020-06-10 DIAGNOSIS — K219 Gastro-esophageal reflux disease without esophagitis: Secondary | ICD-10-CM | POA: Diagnosis not present

## 2020-06-10 DIAGNOSIS — R55 Syncope and collapse: Secondary | ICD-10-CM | POA: Diagnosis not present

## 2020-06-10 DIAGNOSIS — M1990 Unspecified osteoarthritis, unspecified site: Secondary | ICD-10-CM | POA: Diagnosis not present

## 2020-06-10 DIAGNOSIS — R42 Dizziness and giddiness: Secondary | ICD-10-CM | POA: Diagnosis not present

## 2020-06-10 DIAGNOSIS — F419 Anxiety disorder, unspecified: Secondary | ICD-10-CM | POA: Diagnosis not present

## 2020-06-15 ENCOUNTER — Ambulatory Visit: Payer: Medicare PPO | Admitting: Family Medicine

## 2020-06-29 DIAGNOSIS — S22000A Wedge compression fracture of unspecified thoracic vertebra, initial encounter for closed fracture: Secondary | ICD-10-CM | POA: Diagnosis not present

## 2020-06-29 DIAGNOSIS — R2689 Other abnormalities of gait and mobility: Secondary | ICD-10-CM | POA: Diagnosis not present

## 2020-06-29 DIAGNOSIS — R55 Syncope and collapse: Secondary | ICD-10-CM | POA: Diagnosis not present

## 2020-07-15 DIAGNOSIS — I680 Cerebral amyloid angiopathy: Secondary | ICD-10-CM | POA: Diagnosis not present

## 2020-07-15 DIAGNOSIS — M1990 Unspecified osteoarthritis, unspecified site: Secondary | ICD-10-CM | POA: Diagnosis not present

## 2020-07-15 DIAGNOSIS — R55 Syncope and collapse: Secondary | ICD-10-CM | POA: Diagnosis not present

## 2020-07-15 DIAGNOSIS — K219 Gastro-esophageal reflux disease without esophagitis: Secondary | ICD-10-CM | POA: Diagnosis not present

## 2020-07-15 DIAGNOSIS — F0390 Unspecified dementia without behavioral disturbance: Secondary | ICD-10-CM | POA: Diagnosis not present

## 2020-07-15 DIAGNOSIS — F419 Anxiety disorder, unspecified: Secondary | ICD-10-CM | POA: Diagnosis not present

## 2020-07-15 DIAGNOSIS — E7849 Other hyperlipidemia: Secondary | ICD-10-CM | POA: Diagnosis not present

## 2020-07-15 DIAGNOSIS — J679 Hypersensitivity pneumonitis due to unspecified organic dust: Secondary | ICD-10-CM | POA: Diagnosis not present

## 2020-07-15 DIAGNOSIS — R42 Dizziness and giddiness: Secondary | ICD-10-CM | POA: Diagnosis not present

## 2020-07-30 DIAGNOSIS — S22000A Wedge compression fracture of unspecified thoracic vertebra, initial encounter for closed fracture: Secondary | ICD-10-CM | POA: Diagnosis not present

## 2020-07-30 DIAGNOSIS — R55 Syncope and collapse: Secondary | ICD-10-CM | POA: Diagnosis not present

## 2020-07-30 DIAGNOSIS — R2689 Other abnormalities of gait and mobility: Secondary | ICD-10-CM | POA: Diagnosis not present

## 2020-08-16 IMAGING — US BILATERAL CAROTID DUPLEX ULTRASOUND
1 series · 13 of 24 positions shown · non-contrast
Comparison: None.

CLINICAL DATA: 86-year-old female with syncope and collapse

EXAM:
BILATERAL CAROTID DUPLEX ULTRASOUND
TECHNIQUE: Gray scale imaging, color Doppler and duplex ultrasound were
performed of bilateral carotid and vertebral arteries in the neck.

[Series 1: bilateral carotid duplex ultrasound · 0.06mm/px · 13 of 122 slices shown]
[im 1/122]
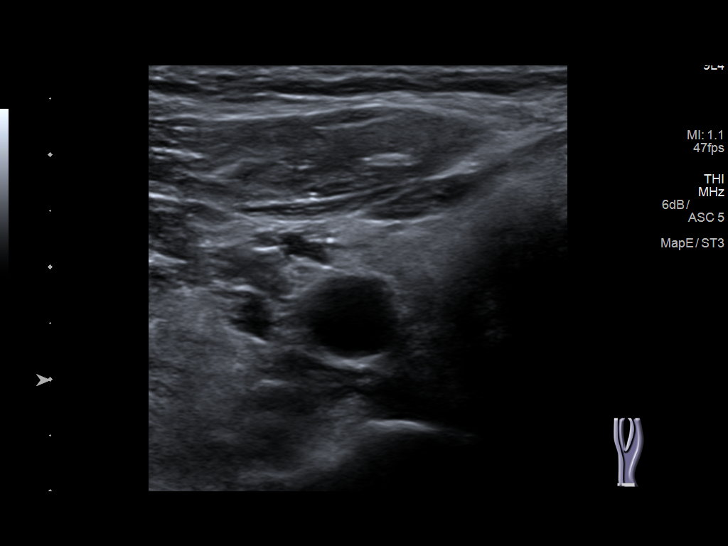
[im 11/122]
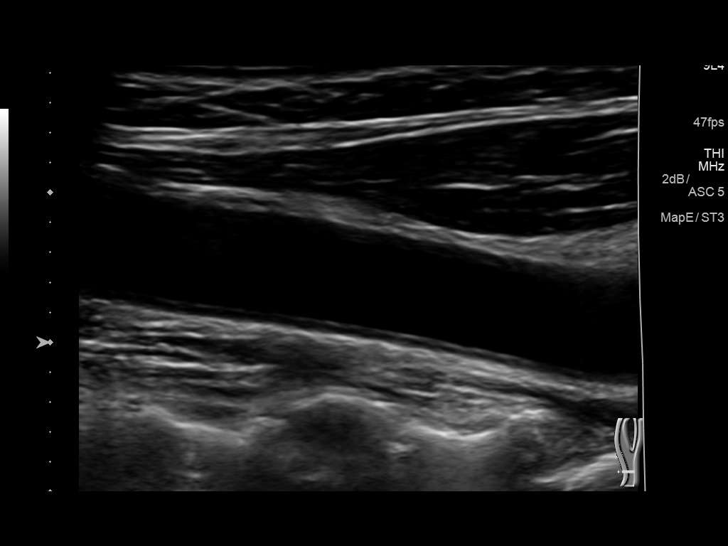
[im 22/122]
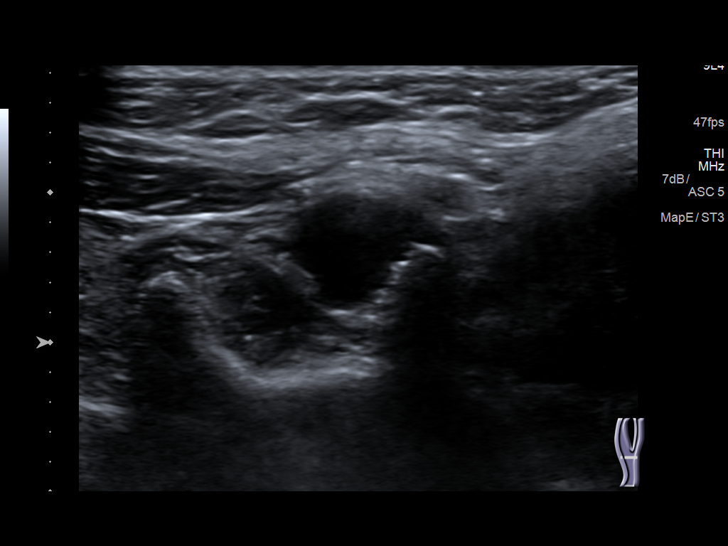
[im 32/122]
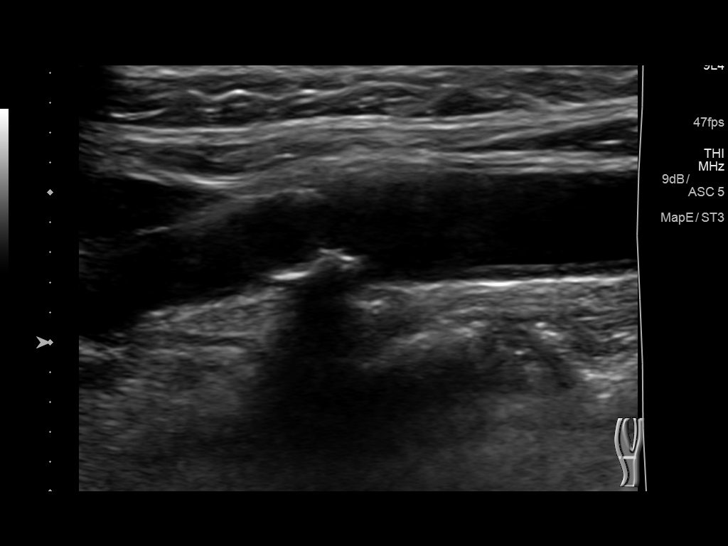
[im 43/122]
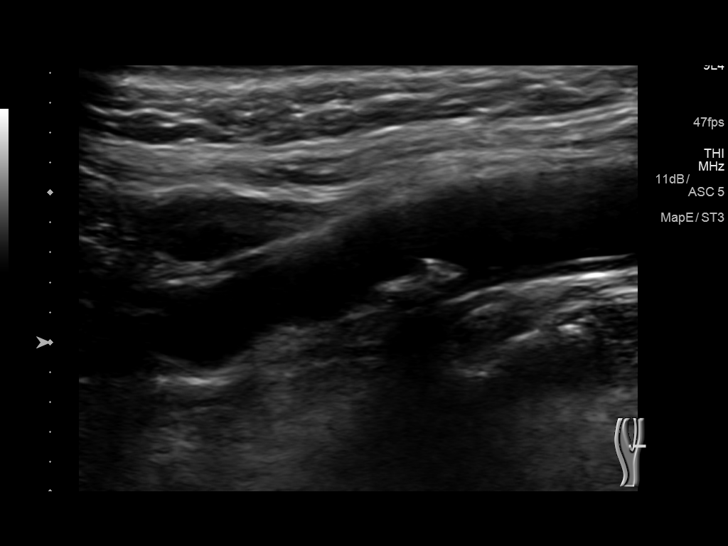
[im 53/122]
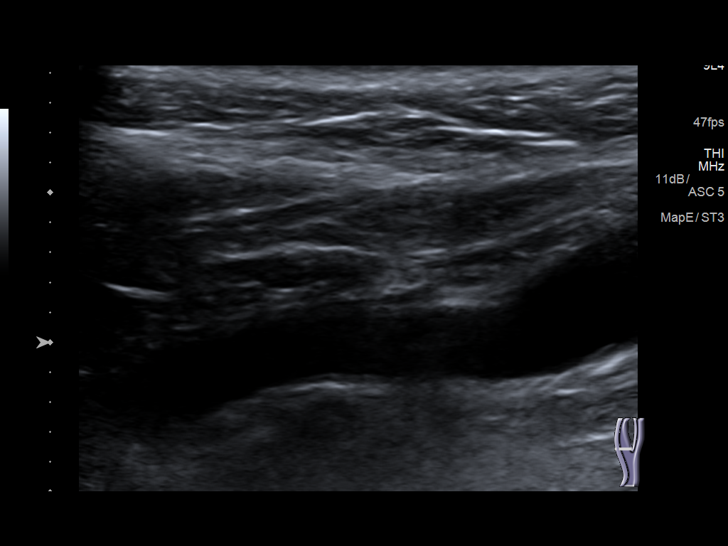
[im 64/122]
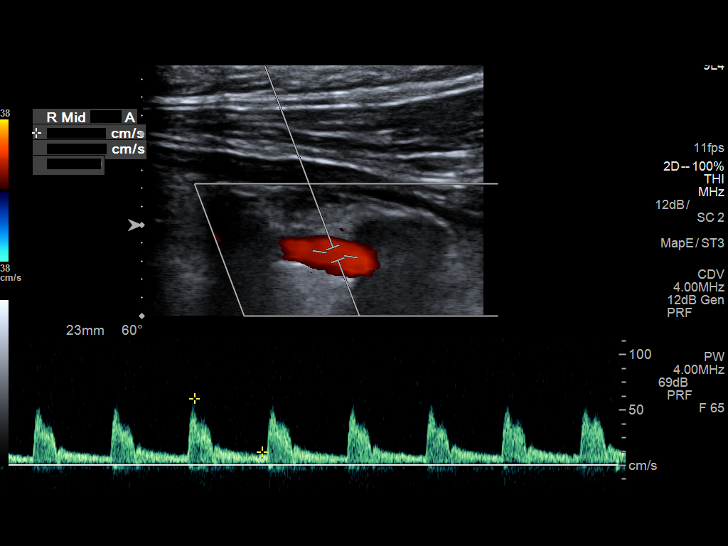
[im 69/122]
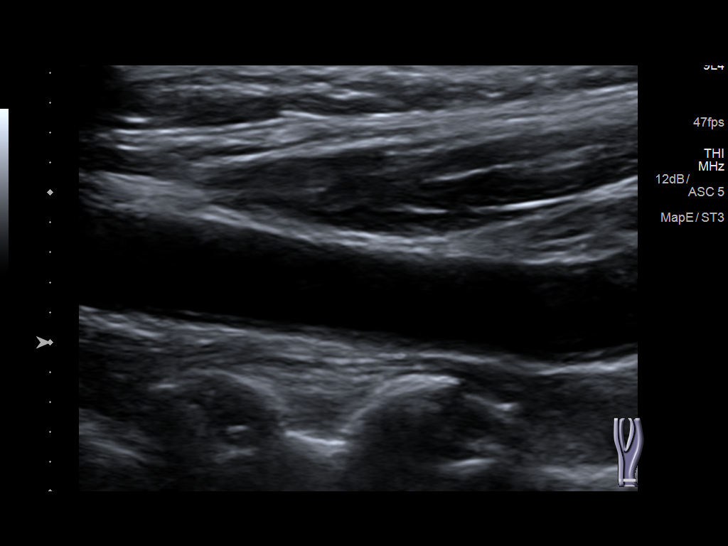
[im 79/122]
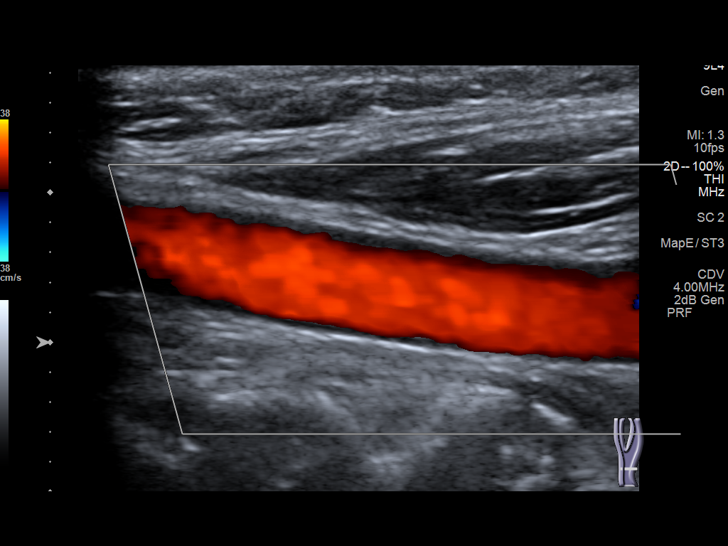
[im 90/122]
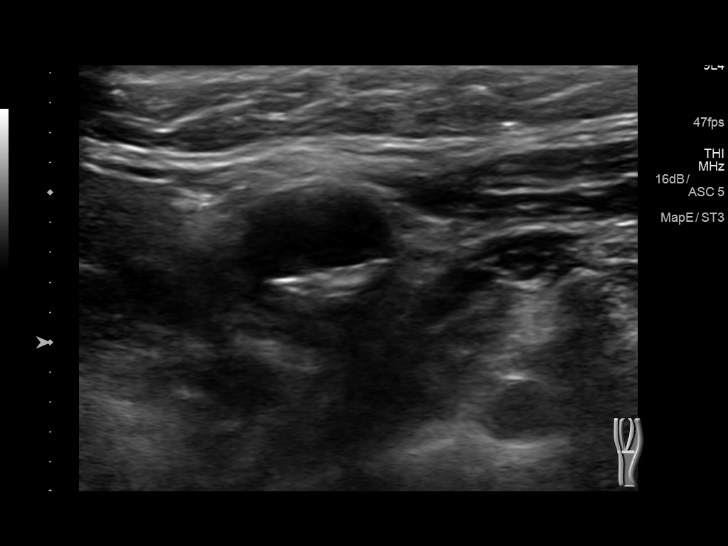
[im 100/122]
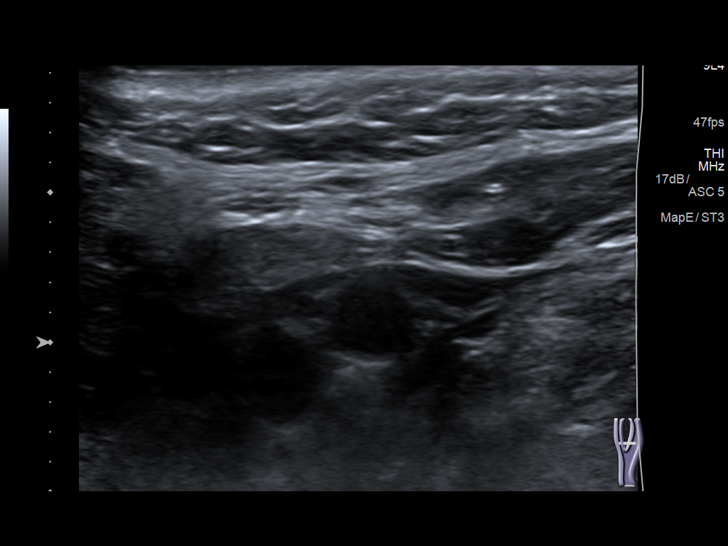
[im 111/122]
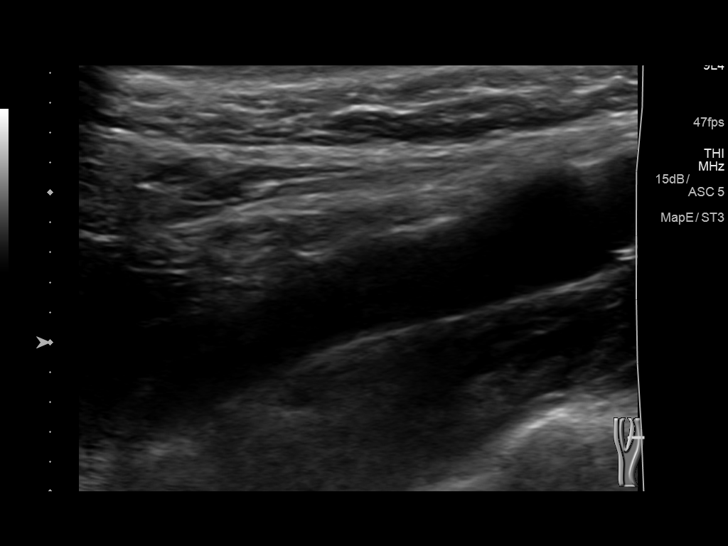
[im 122/122]
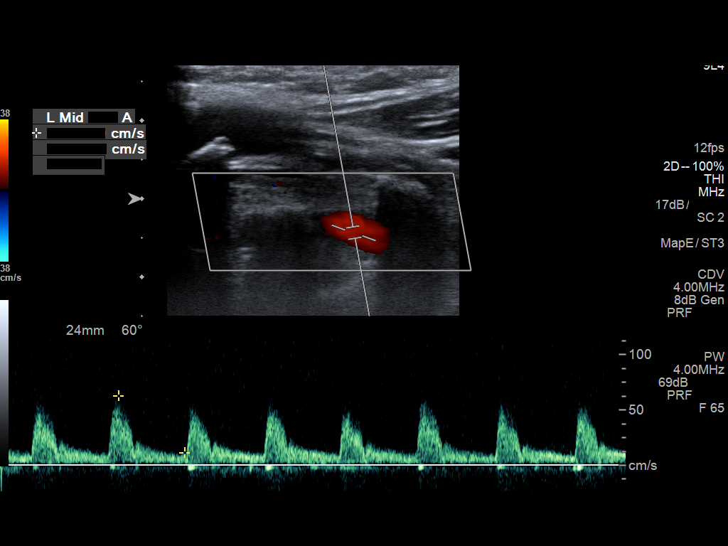

[13 of 24 positions shown; findings below may reference images not displayed]

FINDINGS: Criteria: Quantification of carotid stenosis is based on velocity
parameters that correlate the residual internal carotid diameter
with NASCET-based stenosis levels, using the diameter of the distal
internal carotid lumen as the denominator for stenosis measurement.

The following velocity measurements were obtained:

RIGHT
ICA: 106/26 cm/sec
CCA: 92/18 cm/sec

SYSTOLIC ICA/CCA RATIO:

ECA:  111 cm/sec

LEFT

ICA: 72/18 cm/sec

CCA: 87/15 cm/sec

SYSTOLIC ICA/CCA RATIO:

ECA:  83 cm/sec

RIGHT CAROTID ARTERY: Mild heterogeneous partially calcified
atherosclerotic plaque in the proximal internal carotid artery. By
peak systolic velocity criteria, the estimated stenosis remains less
than 50%.

RIGHT VERTEBRAL ARTERY:  Patent with normal antegrade flow.

LEFT CAROTID ARTERY: Mild echogenic and likely calcified
atherosclerotic plaque in the proximal internal carotid artery. By
peak systolic velocity criteria, the estimated stenosis remains less
than 50%.

LEFT VERTEBRAL ARTERY:  Patent with normal antegrade flow.
IMPRESSION: 1. Mild (1-49%) stenosis proximal right internal carotid artery
secondary to mild focal echogenic/calcified atherosclerotic plaque.
2. Mild (1-49%) stenosis proximal left internal carotid artery
secondary to mild focal echogenic/calcified atherosclerotic plaque.
3. Vertebral arteries are patent with normal antegrade flow.

## 2020-08-18 DIAGNOSIS — K219 Gastro-esophageal reflux disease without esophagitis: Secondary | ICD-10-CM | POA: Diagnosis not present

## 2020-08-18 DIAGNOSIS — I680 Cerebral amyloid angiopathy: Secondary | ICD-10-CM | POA: Diagnosis not present

## 2020-08-18 DIAGNOSIS — Z993 Dependence on wheelchair: Secondary | ICD-10-CM | POA: Diagnosis not present

## 2020-08-18 DIAGNOSIS — Z9181 History of falling: Secondary | ICD-10-CM | POA: Diagnosis not present

## 2020-08-18 DIAGNOSIS — K580 Irritable bowel syndrome with diarrhea: Secondary | ICD-10-CM | POA: Diagnosis not present

## 2020-08-18 DIAGNOSIS — E7849 Other hyperlipidemia: Secondary | ICD-10-CM | POA: Diagnosis not present

## 2020-08-18 DIAGNOSIS — F0390 Unspecified dementia without behavioral disturbance: Secondary | ICD-10-CM | POA: Diagnosis not present

## 2020-08-18 DIAGNOSIS — M1990 Unspecified osteoarthritis, unspecified site: Secondary | ICD-10-CM | POA: Diagnosis not present

## 2020-08-18 DIAGNOSIS — R42 Dizziness and giddiness: Secondary | ICD-10-CM | POA: Diagnosis not present

## 2020-08-24 ENCOUNTER — Other Ambulatory Visit: Payer: Self-pay

## 2020-08-24 ENCOUNTER — Telehealth: Payer: Medicare PPO | Admitting: Gastroenterology

## 2020-08-29 DIAGNOSIS — R55 Syncope and collapse: Secondary | ICD-10-CM | POA: Diagnosis not present

## 2020-08-29 DIAGNOSIS — R2689 Other abnormalities of gait and mobility: Secondary | ICD-10-CM | POA: Diagnosis not present

## 2020-08-29 DIAGNOSIS — S22000A Wedge compression fracture of unspecified thoracic vertebra, initial encounter for closed fracture: Secondary | ICD-10-CM | POA: Diagnosis not present

## 2020-09-03 DIAGNOSIS — Z9181 History of falling: Secondary | ICD-10-CM | POA: Diagnosis not present

## 2020-09-03 DIAGNOSIS — E7849 Other hyperlipidemia: Secondary | ICD-10-CM | POA: Diagnosis not present

## 2020-09-03 DIAGNOSIS — F0390 Unspecified dementia without behavioral disturbance: Secondary | ICD-10-CM | POA: Diagnosis not present

## 2020-09-08 DIAGNOSIS — Z79899 Other long term (current) drug therapy: Secondary | ICD-10-CM | POA: Diagnosis not present

## 2020-09-08 DIAGNOSIS — N390 Urinary tract infection, site not specified: Secondary | ICD-10-CM | POA: Diagnosis not present

## 2020-09-08 DIAGNOSIS — E559 Vitamin D deficiency, unspecified: Secondary | ICD-10-CM | POA: Diagnosis not present

## 2020-09-21 DIAGNOSIS — N390 Urinary tract infection, site not specified: Secondary | ICD-10-CM | POA: Diagnosis not present

## 2020-09-29 DIAGNOSIS — R2689 Other abnormalities of gait and mobility: Secondary | ICD-10-CM | POA: Diagnosis not present

## 2020-09-29 DIAGNOSIS — S22000A Wedge compression fracture of unspecified thoracic vertebra, initial encounter for closed fracture: Secondary | ICD-10-CM | POA: Diagnosis not present

## 2020-09-29 DIAGNOSIS — R55 Syncope and collapse: Secondary | ICD-10-CM | POA: Diagnosis not present

## 2020-09-30 DIAGNOSIS — F0390 Unspecified dementia without behavioral disturbance: Secondary | ICD-10-CM | POA: Diagnosis not present

## 2020-09-30 DIAGNOSIS — M1990 Unspecified osteoarthritis, unspecified site: Secondary | ICD-10-CM | POA: Diagnosis not present

## 2020-09-30 DIAGNOSIS — R42 Dizziness and giddiness: Secondary | ICD-10-CM | POA: Diagnosis not present

## 2020-09-30 DIAGNOSIS — E7849 Other hyperlipidemia: Secondary | ICD-10-CM | POA: Diagnosis not present

## 2020-09-30 DIAGNOSIS — K580 Irritable bowel syndrome with diarrhea: Secondary | ICD-10-CM | POA: Diagnosis not present

## 2020-09-30 DIAGNOSIS — E559 Vitamin D deficiency, unspecified: Secondary | ICD-10-CM | POA: Diagnosis not present

## 2020-09-30 DIAGNOSIS — R55 Syncope and collapse: Secondary | ICD-10-CM | POA: Diagnosis not present

## 2020-09-30 DIAGNOSIS — I680 Cerebral amyloid angiopathy: Secondary | ICD-10-CM | POA: Diagnosis not present

## 2020-09-30 DIAGNOSIS — K219 Gastro-esophageal reflux disease without esophagitis: Secondary | ICD-10-CM | POA: Diagnosis not present

## 2020-10-04 DIAGNOSIS — R5383 Other fatigue: Secondary | ICD-10-CM | POA: Diagnosis not present

## 2020-10-04 DIAGNOSIS — E559 Vitamin D deficiency, unspecified: Secondary | ICD-10-CM | POA: Diagnosis not present

## 2020-10-04 DIAGNOSIS — E7849 Other hyperlipidemia: Secondary | ICD-10-CM | POA: Diagnosis not present

## 2020-10-04 DIAGNOSIS — F0390 Unspecified dementia without behavioral disturbance: Secondary | ICD-10-CM | POA: Diagnosis not present

## 2020-10-04 DIAGNOSIS — Z9181 History of falling: Secondary | ICD-10-CM | POA: Diagnosis not present

## 2020-10-30 DIAGNOSIS — S22000A Wedge compression fracture of unspecified thoracic vertebra, initial encounter for closed fracture: Secondary | ICD-10-CM | POA: Diagnosis not present

## 2020-10-30 DIAGNOSIS — R2689 Other abnormalities of gait and mobility: Secondary | ICD-10-CM | POA: Diagnosis not present

## 2020-10-30 DIAGNOSIS — R55 Syncope and collapse: Secondary | ICD-10-CM | POA: Diagnosis not present

## 2020-11-04 DIAGNOSIS — M1990 Unspecified osteoarthritis, unspecified site: Secondary | ICD-10-CM | POA: Diagnosis not present

## 2020-11-04 DIAGNOSIS — R55 Syncope and collapse: Secondary | ICD-10-CM | POA: Diagnosis not present

## 2020-11-04 DIAGNOSIS — R42 Dizziness and giddiness: Secondary | ICD-10-CM | POA: Diagnosis not present

## 2020-11-04 DIAGNOSIS — I680 Cerebral amyloid angiopathy: Secondary | ICD-10-CM | POA: Diagnosis not present

## 2020-11-04 DIAGNOSIS — K580 Irritable bowel syndrome with diarrhea: Secondary | ICD-10-CM | POA: Diagnosis not present

## 2020-11-04 DIAGNOSIS — E7849 Other hyperlipidemia: Secondary | ICD-10-CM | POA: Diagnosis not present

## 2020-11-04 DIAGNOSIS — Z9181 History of falling: Secondary | ICD-10-CM | POA: Diagnosis not present

## 2020-11-04 DIAGNOSIS — F0390 Unspecified dementia without behavioral disturbance: Secondary | ICD-10-CM | POA: Diagnosis not present

## 2020-11-04 DIAGNOSIS — J679 Hypersensitivity pneumonitis due to unspecified organic dust: Secondary | ICD-10-CM | POA: Diagnosis not present

## 2020-11-04 DIAGNOSIS — K219 Gastro-esophageal reflux disease without esophagitis: Secondary | ICD-10-CM | POA: Diagnosis not present

## 2020-11-22 DIAGNOSIS — N390 Urinary tract infection, site not specified: Secondary | ICD-10-CM | POA: Diagnosis not present

## 2020-11-22 DIAGNOSIS — B9620 Unspecified Escherichia coli [E. coli] as the cause of diseases classified elsewhere: Secondary | ICD-10-CM | POA: Diagnosis not present

## 2020-11-29 DIAGNOSIS — R2689 Other abnormalities of gait and mobility: Secondary | ICD-10-CM | POA: Diagnosis not present

## 2020-11-29 DIAGNOSIS — S22000A Wedge compression fracture of unspecified thoracic vertebra, initial encounter for closed fracture: Secondary | ICD-10-CM | POA: Diagnosis not present

## 2020-11-29 DIAGNOSIS — R55 Syncope and collapse: Secondary | ICD-10-CM | POA: Diagnosis not present

## 2020-12-04 DIAGNOSIS — E7849 Other hyperlipidemia: Secondary | ICD-10-CM | POA: Diagnosis not present

## 2020-12-04 DIAGNOSIS — Z9181 History of falling: Secondary | ICD-10-CM | POA: Diagnosis not present

## 2020-12-04 DIAGNOSIS — F0390 Unspecified dementia without behavioral disturbance: Secondary | ICD-10-CM | POA: Diagnosis not present

## 2020-12-23 DIAGNOSIS — E7849 Other hyperlipidemia: Secondary | ICD-10-CM | POA: Diagnosis not present

## 2020-12-23 DIAGNOSIS — R42 Dizziness and giddiness: Secondary | ICD-10-CM | POA: Diagnosis not present

## 2020-12-23 DIAGNOSIS — M1990 Unspecified osteoarthritis, unspecified site: Secondary | ICD-10-CM | POA: Diagnosis not present

## 2020-12-23 DIAGNOSIS — K580 Irritable bowel syndrome with diarrhea: Secondary | ICD-10-CM | POA: Diagnosis not present

## 2020-12-23 DIAGNOSIS — I680 Cerebral amyloid angiopathy: Secondary | ICD-10-CM | POA: Diagnosis not present

## 2020-12-23 DIAGNOSIS — R55 Syncope and collapse: Secondary | ICD-10-CM | POA: Diagnosis not present

## 2020-12-23 DIAGNOSIS — K219 Gastro-esophageal reflux disease without esophagitis: Secondary | ICD-10-CM | POA: Diagnosis not present

## 2020-12-23 DIAGNOSIS — Z993 Dependence on wheelchair: Secondary | ICD-10-CM | POA: Diagnosis not present

## 2020-12-23 DIAGNOSIS — Z9181 History of falling: Secondary | ICD-10-CM | POA: Diagnosis not present

## 2020-12-25 IMAGING — CT CT CERVICAL SPINE W/O CM
4 of 7 series · 13 of 33 positions shown, 14 images · non-contrast
Comparison: Brain MR dated 09/27/2017. Head and neck CTA dated
09/27/2017. Head CT dated 09/27/2017.

CLINICAL DATA: Syncopal episode resulting in a fall at home in the
bathroom with a laceration to the middle of the forehead.

EXAM:
CT HEAD WITHOUT CONTRAST
CT CERVICAL SPINE WITHOUT CONTRAST
TECHNIQUE: Multidetector CT imaging of the head and cervical spine was
performed following the standard protocol without intravenous
contrast. Multiplanar CT image reconstructions of the cervical spine
were also generated.

[Series 8: c spine soft · axial · 0.34mm/px · z∈[-144,-48]mm · 4 of 81 slices shown]
[im 17/81  soft-tissue]
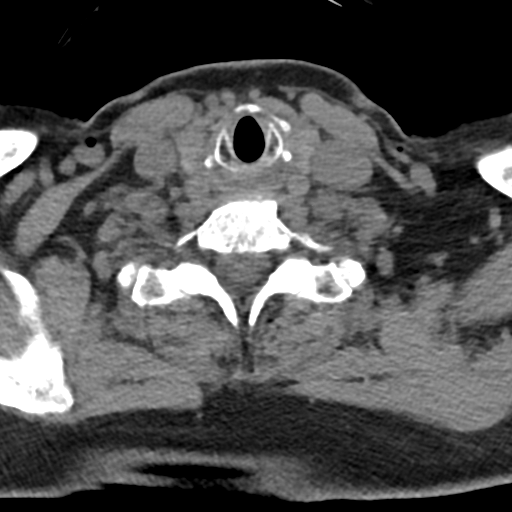
[im 33/81  soft-tissue]
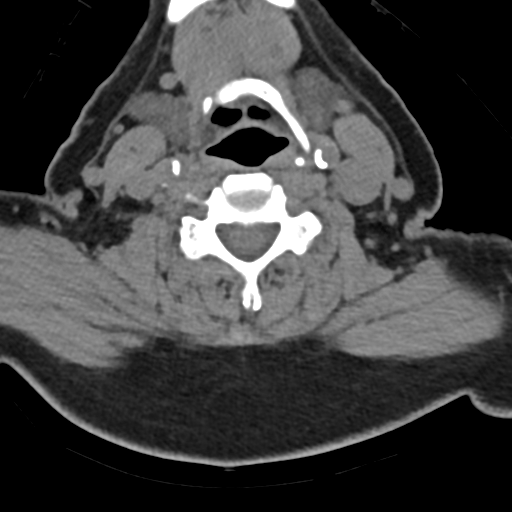
[im 49/81  soft-tissue]
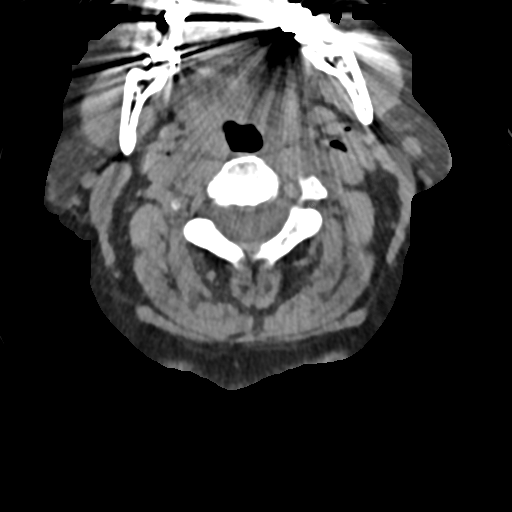
[im 65/81  soft-tissue]
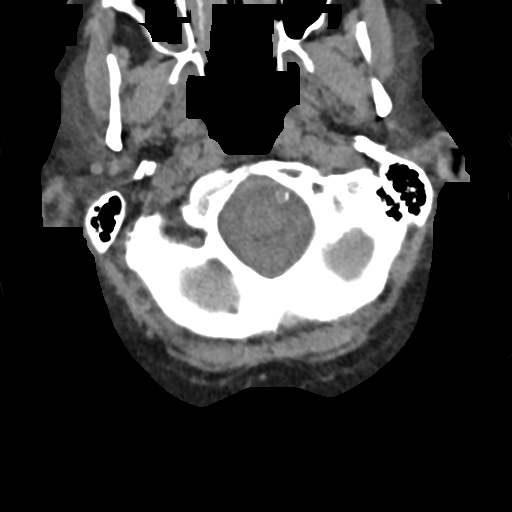

[Series 9: sagittal bone · sagittal · 0.23mm/px · 4 of 61 slices shown]
[im 13/61  bone]
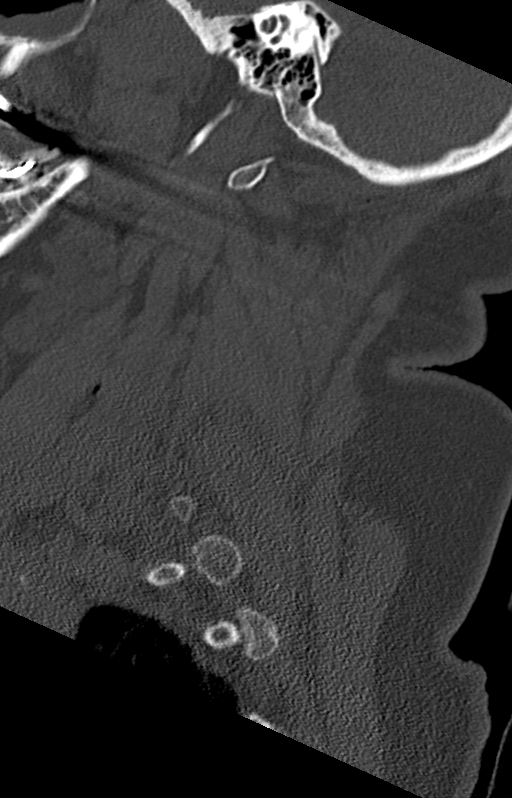
[im 25/61  bone]
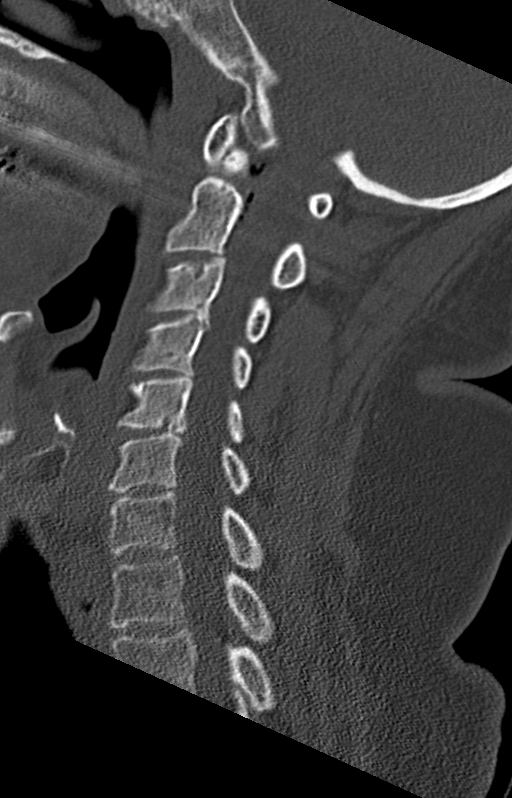
[im 37/61  bone]
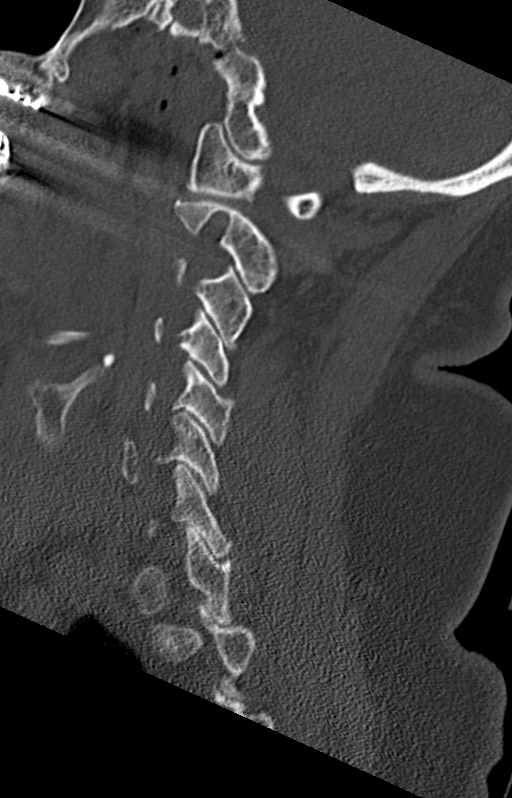
[im 49/61  bone]
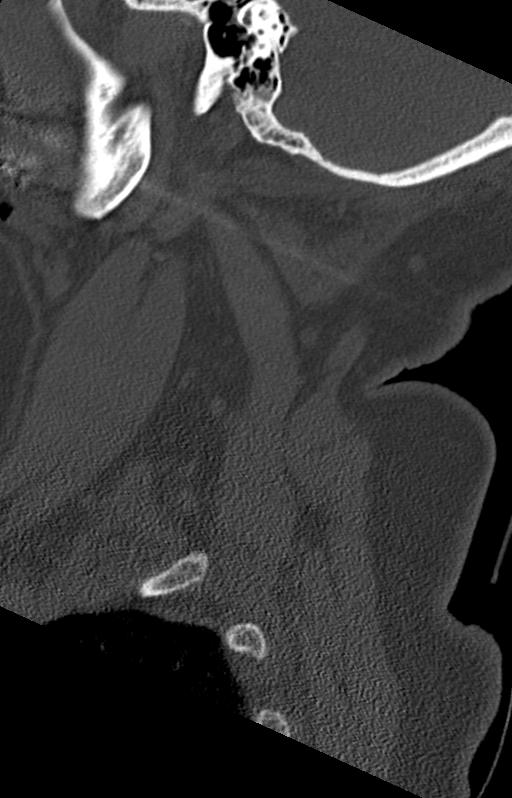

[Series 10: coronal bone · coronal · 0.23mm/px · 1 of 61 slices shown]
[im 31/61  bone]
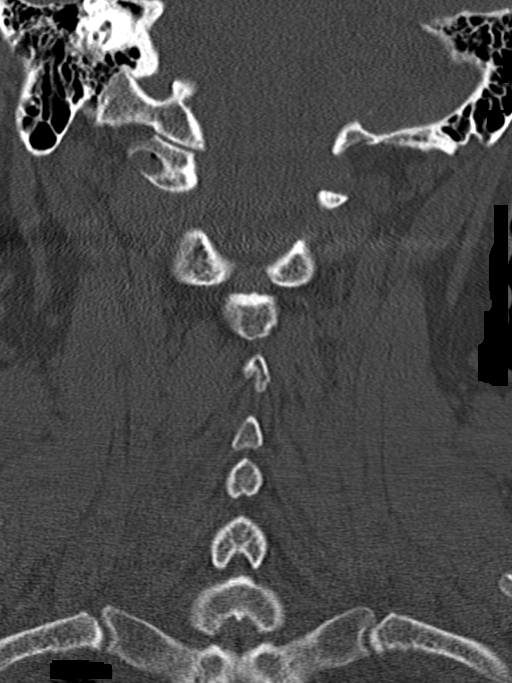

[Series 11: orthogonal bone · axial · 0.21mm/px · z∈[-165,-65]mm · 4 of 78 slices shown, 5 images]
[im 16/78  soft-tissue]
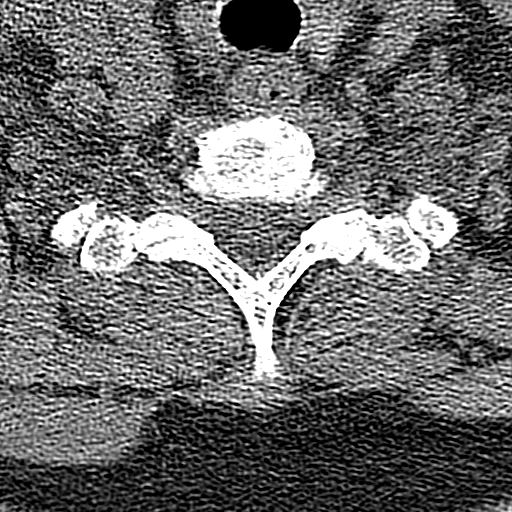
[im 16/78  bone]
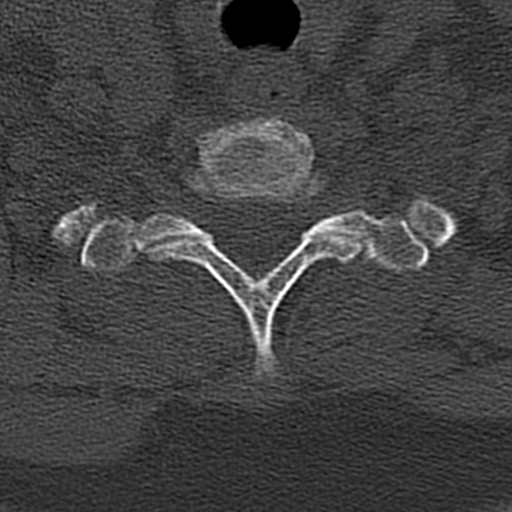
[im 31/78  bone]
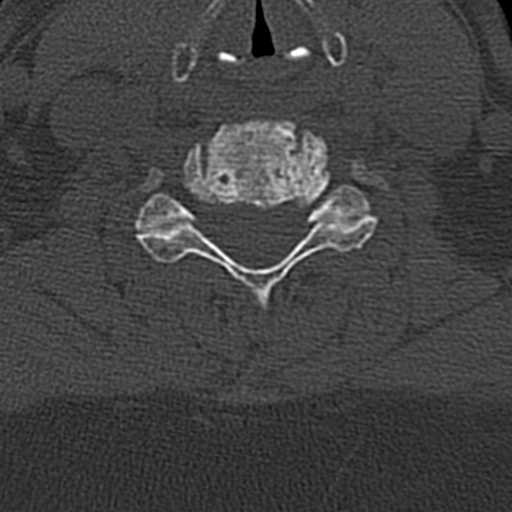
[im 47/78  bone]
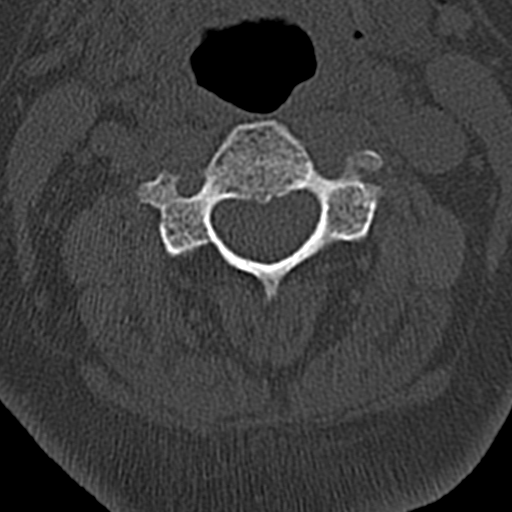
[im 62/78  bone]
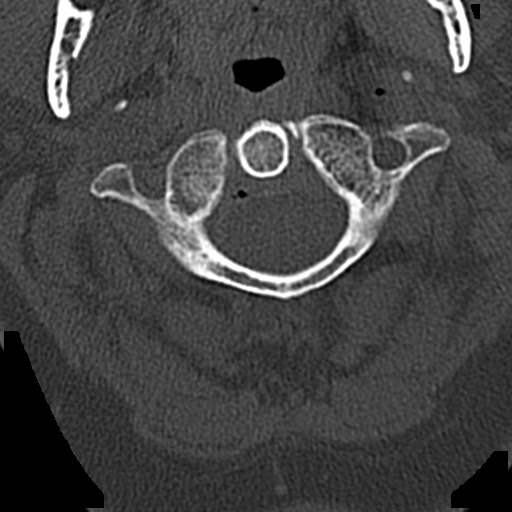

[13 of 33 positions shown; findings below may reference images not displayed]

FINDINGS: CT HEAD FINDINGS

Brain: Moderately enlarged ventricles and subarachnoid spaces.
Moderate patchy white matter low density in both cerebral
hemispheres. No intracranial hemorrhage, mass lesion or CT evidence
of acute infarction.

Vascular: No hyperdense vessel or unexpected calcification.

Skull: Normal. Negative for fracture or focal lesion.

Sinuses/Orbits: Status post bilateral cataract extraction. Mild
right maxillary sinus mucosal thickening.

Other: Bilateral intravenous air at the skull base and posterior,
superior left orbit, compatible with recent intravenous access.
Small right frontal scalp hematoma and laceration with soft tissue
air..

CT CERVICAL SPINE FINDINGS

Alignment: Normal.

Skull base and vertebrae: No acute fracture. No primary bone lesion
or focal pathologic process.

Soft tissues and spinal canal: No prevertebral fluid or swelling. No
visible canal hematoma.

Disc levels:  Multilevel degenerative changes.

Upper chest: Clear lung apices.

Other: Bilateral intravenous air compatible with recent intravenous
access. Bilateral carotid artery calcifications.
IMPRESSION: 1. No skull fracture or intracranial hemorrhage.
2. No cervical spine fracture or subluxation.
3. Moderate diffuse cerebral and cerebellar atrophy.
4. Moderate chronic small vessel white matter ischemic changes in
both cerebral hemispheres.
5. Multilevel cervical spine degenerative changes.
6. Bilateral carotid artery atheromatous calcifications.

## 2020-12-30 DIAGNOSIS — S22000A Wedge compression fracture of unspecified thoracic vertebra, initial encounter for closed fracture: Secondary | ICD-10-CM | POA: Diagnosis not present

## 2020-12-30 DIAGNOSIS — R55 Syncope and collapse: Secondary | ICD-10-CM | POA: Diagnosis not present

## 2020-12-30 DIAGNOSIS — R2689 Other abnormalities of gait and mobility: Secondary | ICD-10-CM | POA: Diagnosis not present

## 2021-01-05 DIAGNOSIS — N390 Urinary tract infection, site not specified: Secondary | ICD-10-CM | POA: Diagnosis not present

## 2021-01-07 DIAGNOSIS — M79661 Pain in right lower leg: Secondary | ICD-10-CM | POA: Diagnosis not present

## 2021-01-07 DIAGNOSIS — R2241 Localized swelling, mass and lump, right lower limb: Secondary | ICD-10-CM | POA: Diagnosis not present

## 2021-01-12 ENCOUNTER — Other Ambulatory Visit: Payer: Self-pay

## 2021-01-12 ENCOUNTER — Emergency Department (HOSPITAL_COMMUNITY): Payer: Medicare PPO

## 2021-01-12 ENCOUNTER — Emergency Department (HOSPITAL_COMMUNITY)
Admission: EM | Admit: 2021-01-12 | Discharge: 2021-01-14 | Disposition: A | Discharge status: Home / Self Care | Payer: Medicare PPO | Attending: Emergency Medicine | Admitting: Emergency Medicine

## 2021-01-12 DIAGNOSIS — B961 Klebsiella pneumoniae [K. pneumoniae] as the cause of diseases classified elsewhere: Secondary | ICD-10-CM | POA: Diagnosis not present

## 2021-01-12 DIAGNOSIS — M810 Age-related osteoporosis without current pathological fracture: Secondary | ICD-10-CM | POA: Diagnosis not present

## 2021-01-12 DIAGNOSIS — R410 Disorientation, unspecified: Secondary | ICD-10-CM | POA: Diagnosis not present

## 2021-01-12 DIAGNOSIS — M6281 Muscle weakness (generalized): Secondary | ICD-10-CM | POA: Insufficient documentation

## 2021-01-12 DIAGNOSIS — Z79899 Other long term (current) drug therapy: Secondary | ICD-10-CM | POA: Insufficient documentation

## 2021-01-12 DIAGNOSIS — N390 Urinary tract infection, site not specified: Secondary | ICD-10-CM | POA: Insufficient documentation

## 2021-01-12 DIAGNOSIS — Z736 Limitation of activities due to disability: Secondary | ICD-10-CM | POA: Insufficient documentation

## 2021-01-12 DIAGNOSIS — Z91040 Latex allergy status: Secondary | ICD-10-CM | POA: Diagnosis not present

## 2021-01-12 DIAGNOSIS — Z20822 Contact with and (suspected) exposure to covid-19: Secondary | ICD-10-CM | POA: Insufficient documentation

## 2021-01-12 DIAGNOSIS — Z85828 Personal history of other malignant neoplasm of skin: Secondary | ICD-10-CM | POA: Insufficient documentation

## 2021-01-12 DIAGNOSIS — F0390 Unspecified dementia without behavioral disturbance: Secondary | ICD-10-CM | POA: Insufficient documentation

## 2021-01-12 DIAGNOSIS — R2681 Unsteadiness on feet: Secondary | ICD-10-CM | POA: Insufficient documentation

## 2021-01-12 DIAGNOSIS — Z789 Other specified health status: Secondary | ICD-10-CM

## 2021-01-12 DIAGNOSIS — M545 Low back pain, unspecified: Secondary | ICD-10-CM | POA: Diagnosis not present

## 2021-01-12 DIAGNOSIS — R4182 Altered mental status, unspecified: Secondary | ICD-10-CM | POA: Diagnosis not present

## 2021-01-12 DIAGNOSIS — Z87891 Personal history of nicotine dependence: Secondary | ICD-10-CM | POA: Diagnosis not present

## 2021-01-12 DIAGNOSIS — G319 Degenerative disease of nervous system, unspecified: Secondary | ICD-10-CM | POA: Diagnosis not present

## 2021-01-12 DIAGNOSIS — R9431 Abnormal electrocardiogram [ECG] [EKG]: Secondary | ICD-10-CM | POA: Diagnosis not present

## 2021-01-12 LAB — CBC WITH DIFFERENTIAL/PLATELET
Abs Immature Granulocytes: 0.02 10*3/uL (ref 0.00–0.07)
Basophils Absolute: 0 10*3/uL (ref 0.0–0.1)
Basophils Relative: 1 %
Eosinophils Absolute: 0.1 10*3/uL (ref 0.0–0.5)
Eosinophils Relative: 2 %
HCT: 38.7 % (ref 36.0–46.0)
Hemoglobin: 12.8 g/dL (ref 12.0–15.0)
Immature Granulocytes: 0 %
Lymphocytes Relative: 30 %
Lymphs Abs: 1.7 10*3/uL (ref 0.7–4.0)
MCH: 32.1 pg (ref 26.0–34.0)
MCHC: 33.1 g/dL (ref 30.0–36.0)
MCV: 97 fL (ref 80.0–100.0)
Monocytes Absolute: 0.4 10*3/uL (ref 0.1–1.0)
Monocytes Relative: 8 %
Neutro Abs: 3.2 10*3/uL (ref 1.7–7.7)
Neutrophils Relative %: 59 %
Platelets: 155 10*3/uL (ref 150–400)
RBC: 3.99 MIL/uL (ref 3.87–5.11)
RDW: 13.1 % (ref 11.5–15.5)
WBC: 5.5 10*3/uL (ref 4.0–10.5)
nRBC: 0 % (ref 0.0–0.2)

## 2021-01-12 LAB — URINALYSIS, ROUTINE W REFLEX MICROSCOPIC
Bilirubin Urine: NEGATIVE
Glucose, UA: NEGATIVE mg/dL
Hgb urine dipstick: NEGATIVE
Ketones, ur: NEGATIVE mg/dL
Nitrite: NEGATIVE
Protein, ur: NEGATIVE mg/dL
Specific Gravity, Urine: 1.014 (ref 1.005–1.030)
pH: 7 (ref 5.0–8.0)

## 2021-01-12 LAB — BLOOD GAS, VENOUS
Acid-Base Excess: 3.1 mmol/L — ABNORMAL HIGH (ref 0.0–2.0)
Bicarbonate: 25.7 mmol/L (ref 20.0–28.0)
FIO2: 21
O2 Saturation: 46.2 %
Patient temperature: 36.8
pCO2, Ven: 46.5 mmHg (ref 44.0–60.0)
pH, Ven: 7.391 (ref 7.250–7.430)
pO2, Ven: <31 mmHg — CL (ref 32.0–45.0)

## 2021-01-12 LAB — COMPREHENSIVE METABOLIC PANEL
ALT: 10 U/L (ref 0–44)
AST: 19 U/L (ref 15–41)
Albumin: 3.8 g/dL (ref 3.5–5.0)
Alkaline Phosphatase: 69 U/L (ref 38–126)
Anion gap: 8 (ref 5–15)
BUN: 16 mg/dL (ref 8–23)
CO2: 24 mmol/L (ref 22–32)
Calcium: 9.2 mg/dL (ref 8.9–10.3)
Chloride: 107 mmol/L (ref 98–111)
Creatinine, Ser: 0.83 mg/dL (ref 0.44–1.00)
GFR, Estimated: >60 mL/min (ref >60)
Glucose, Bld: 99 mg/dL (ref 70–99)
Potassium: 4.2 mmol/L (ref 3.5–5.1)
Sodium: 139 mmol/L (ref 135–145)
Total Bilirubin: 0.5 mg/dL (ref 0.3–1.2)
Total Protein: 6.7 g/dL (ref 6.5–8.1)

## 2021-01-12 LAB — ACETAMINOPHEN LEVEL: Acetaminophen (Tylenol), Serum: <10 ug/mL — ABNORMAL LOW (ref 10–30)

## 2021-01-12 LAB — RESP PANEL BY RT-PCR (FLU A&B, COVID) ARPGX2
Influenza A by PCR: NEGATIVE
Influenza B by PCR: NEGATIVE
SARS Coronavirus 2 by RT PCR: NEGATIVE

## 2021-01-12 LAB — TSH: TSH: 2.828 u[IU]/mL (ref 0.350–4.500)

## 2021-01-12 LAB — SALICYLATE LEVEL: Salicylate Lvl: <7 mg/dL — ABNORMAL LOW (ref 7.0–30.0)

## 2021-01-12 LAB — TROPONIN I (HIGH SENSITIVITY)
Troponin I (High Sensitivity): 17 ng/L (ref <18)
Troponin I (High Sensitivity): 17 ng/L (ref <18)

## 2021-01-12 LAB — CBG MONITORING, ED: Glucose-Capillary: 99 mg/dL (ref 70–99)

## 2021-01-12 MED ORDER — PANTOPRAZOLE SODIUM 40 MG PO TBEC
40.0000 mg | DELAYED_RELEASE_TABLET | Freq: Every day | ORAL | Status: DC
  Administered 2021-01-12 – 2021-01-14 (×3): 40 mg via ORAL
  Filled 2021-01-12 (×3): qty 1

## 2021-01-12 MED ORDER — SULFAMETHOXAZOLE-TRIMETHOPRIM 800-160 MG PO TABS: 1.0000 | ORAL_TABLET | Freq: Two times a day (BID) | ORAL | 0 refills | Status: DC

## 2021-01-12 MED ORDER — HYOSCYAMINE SULFATE ER 0.375 MG PO TB12: 0.3750 mg | ORAL_TABLET | Freq: Every day | ORAL | Status: DC | PRN

## 2021-01-12 MED ORDER — OXCARBAZEPINE 150 MG PO TABS
150.0000 mg | ORAL_TABLET | Freq: Two times a day (BID) | ORAL | Status: DC
  Filled 2021-01-12 (×11): qty 1

## 2021-01-12 MED ORDER — GABAPENTIN 100 MG PO CAPS
100.0000 mg | ORAL_CAPSULE | Freq: Every day | ORAL | Status: DC
  Administered 2021-01-13: 100 mg via ORAL
  Filled 2021-01-12 (×2): qty 1

## 2021-01-12 MED ORDER — TORSEMIDE 20 MG PO TABS
20.0000 mg | ORAL_TABLET | Freq: Every day | ORAL | Status: DC
  Administered 2021-01-14: 20 mg via ORAL
  Filled 2021-01-12 (×2): qty 1

## 2021-01-12 MED ORDER — ACETAMINOPHEN 325 MG PO TABS
650.0000 mg | ORAL_TABLET | ORAL | Status: DC | PRN
  Administered 2021-01-12: 650 mg via ORAL
  Filled 2021-01-12: qty 2

## 2021-01-12 MED ORDER — PANTOPRAZOLE SODIUM 20 MG PO TBEC
20.0000 mg | DELAYED_RELEASE_TABLET | Freq: Two times a day (BID) | ORAL | Status: DC
  Filled 2021-01-12 (×5): qty 1

## 2021-01-12 MED ORDER — SULFAMETHOXAZOLE-TRIMETHOPRIM 800-160 MG PO TABS
1.0000 | ORAL_TABLET | Freq: Two times a day (BID) | ORAL | Status: DC
  Administered 2021-01-12 – 2021-01-14 (×4): 1 via ORAL
  Filled 2021-01-12 (×4): qty 1

## 2021-01-12 MED ORDER — ALPRAZOLAM 0.5 MG PO TABS
0.2500 mg | ORAL_TABLET | Freq: Every evening | ORAL | Status: DC | PRN
  Administered 2021-01-12 – 2021-01-14 (×3): 0.25 mg via ORAL
  Filled 2021-01-12 (×5): qty 1

## 2021-01-12 MED ORDER — ROSUVASTATIN CALCIUM 5 MG PO TABS: 5.0000 mg | ORAL_TABLET | ORAL | Status: DC

## 2021-01-12 NOTE — ED Notes (Signed)
Pt transported to CT ?

## 2021-01-12 NOTE — ED Provider Notes (Addendum)
[facility] EMERGENCY DEPARTMENT Provider Note   CSN: [id number] Arrival date & time: 01/12/21  0805     History Chief Complaint  Patient presents with   Altered Mental Status    Shari Vincent is a 87 y.o. female.  This is a 87 y.o. female with significant medical history as below, including dementia, CVA, subarachnoid hemorrhage who presents to the ED with complaint of altered mental status.  Patient accompanied by son who is caretaker.  Arrives from memory care facility.  Staff at facility concern for urinary tract infection, patient with allergies to cefdinir and levofloxacin so  patient was sent to the ER for IV antibiotics.  Son reports patient with mildly increased agitation over the past few days, no fevers, chills, nausea, vomiting, no change oral intake, patient not complaining of any change to bowel or bladder function although she is poor historian and significantly limited verbal response at baseline. Family feels as though she is currently acting at her baseline. She has a history of recurrent UTIs and is on macrobid ppx  Level 5 caveat, dementia  The history is provided by a caregiver (Son). No language interpreter was used.  Altered Mental Status     Past Medical History:  Diagnosis Date   Acid reflux    Acute cerebrovascul insuff, transient focal neurologic signs/symptoms 06/27/2016   Arthritis    right knee   Bulging lumbar disc    Cancer ([place])    Basal Cell on right shoulder   Colon polyps    Concussion with brief loss of consciousness 03/08/2018   Diverticulitis    Fall 02/2018   High cholesterol    Irritable bowel syndrome (IBS)    Kidney stone    Knee pain    right   Sciatic pain    Stroke ([place]) 09/2017   Subarachnoid hemorrhage (HCC)    Trigeminal neuralgia    Trigeminal neuralgia of right side of face    Trigeminal neuralgia syndrome    Vertigo     Patient Active Problem List   Diagnosis Date Noted   Acute cystitis 02/11/2019   Memory  changes 02/11/2019   Syncope 03/07/2018   TIA (transient ischemic attack) 09/27/2017   Preventative health care 12/13/2016   Urinary incontinence 10/04/2016   Family hx-stroke 06/27/2016   Cerebral amyloid angiopathy (CODE) 06/26/2016   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   Subarachnoid hemorrhage ([place]) 06/25/2016   Upper extremity weakness 06/23/2016   Osteoporosis 10/22/2014   Pelvic mass in female 08/25/2014   PMB (postmenopausal bleeding) 08/06/2014   Medicare annual wellness visit, subsequent 06/17/2014   IBS (irritable bowel syndrome) 05/26/2014   Right-sided low back pain with right-sided sciatica 05/26/2014   Trigeminal neuralgia 05/26/2014   OA (osteoarthritis) of knee 07/23/2012   Venous stasis 07/23/2012   COLONIC POLYPS, HYPERPLASTIC 08/14/2008   Hyperlipidemia 08/14/2008   GERD 08/14/2008   VERTIGO 08/14/2008    Past Surgical History:  Procedure Laterality Date   BREAST BIOPSY Left    BREAST SURGERY     biopsy in [place]  05/27/2008   [facility]:HENIDPOEUM rectal polyp,s/p bx/sigmoid diverticula. hyperplastic polyps. benign random bx   ESOPHAGOGASTRODUODENOSCOPY  05/27/2008   PNT:IRWERX/VQMGQ HH   EYE SURGERY     FOOT SURGERY     Left foot, metal plate   HYSTEROSCOPY WITH D & C N/A 09/03/2014   Procedure: DILATATION AND CURETTAGE /HYSTEROSCOPY;  Surgeon: Donnamae Jude, MD;  Location: [facility];  Service: Gynecology;  Laterality: N/A;  Requesting 09/03/14 @ 2:00p   HYSTEROSCOPY WITH D & C N/A 08/19/2015   Procedure: DILATATION AND CURETTAGE /HYSTEROSCOPY;  Surgeon: Donnamae Jude, MD;  Location: [facility];  Service: Gynecology;  Laterality: N/A;   LITHOTRIPSY     OTHER SURGICAL HISTORY     Uterine Prolapse/Tacking   PROLAPSED UTERINE FIBROID LIGATION     in [place]  09/2017     OB History     Gravida  5   Para  3   Term      Preterm  3   AB  2   Living  3      SAB  2   IAB      Ectopic      Multiple       Live Births              Family History  Problem Relation Age of Onset   Diabetes Mother    Heart disease Mother        MI   Uterine cancer Mother    Stroke Father    Cancer Paternal Grandmother        intestinal   Stomach cancer Maternal Grandmother    Colon cancer Neg Hx    Breast cancer Neg Hx     Social History   Tobacco Use   Smoking status: Former    Types: Cigarettes   Smokeless tobacco: Never  Vaping Use   Vaping Use: Never used  Substance Use Topics   Alcohol use: No   Drug use: No    Home Medications Prior to Admission medications   Medication Sig Start Date End Date Taking? Authorizing Provider  ALPRAZolam (XANAX) 0.25 MG tablet Take 1 tablet (0.25 mg total) by mouth at bedtime as needed for anxiety. 01/08/20  Yes Lomax, Amy, NP  Cranberry 250 MG CAPS Take 1 capsule by mouth daily.   Yes [provider]  gabapentin (NEURONTIN) 100 MG capsule TAKE ONE CAPSULE BY MOUTH AT BEDTIME FOR BACK PAIN Patient taking differently: Take 100 mg by mouth at bedtime. For seizures 10/31/19  Yes Pleas Koch, NP  hyoscyamine (LEVBID) 0.375 MG 12 hr tablet Take 1 tablet (0.375 mg total) by mouth every morning. Patient taking differently: Take 0.375 mg by mouth daily as needed for cramping. 12/24/19  Yes Carlis Stable, NP  Lacosamide (VIMPAT) 100 MG TABS Take 1 tablet (100 mg total) by mouth 2 (two) times daily. Appointment needed for further refills. 12/22/19  Yes Lomax, Amy, NP  Lactobacillus (ACIDOPHILUS PO) Take 1 capsule by mouth daily. 500 million. Equiv to Azo Complete Feminine Bal   Yes [provider]  Lactobacillus-Inulin (VIACTIV DIGESTIVE HEALTH) CHEW Chew 1 tablet by mouth daily.   Yes [provider]  nitrofurantoin, macrocrystal-monohydrate, (MACROBID) 100 MG capsule Take 100 mg by mouth daily. 01/05/21  Yes [provider]  Oral Electrolytes (PEDIATRIC ELECTROLYTE-ZINC) SOLN Take 14 mLs by mouth in the morning and at  bedtime.   Yes [provider]  OXcarbazepine (TRILEPTAL) 150 MG tablet TAKE 1 TABLET (150 MG TOTAL) BY MOUTH 2 (TWO) TIMES DAILY. 10/09/19  Yes Lomax, Amy, NP  pantoprazole (PROTONIX) 20 MG tablet Take 1 tablet (20 mg total) by mouth 2 (two) times daily. 05/30/19  Yes Annitta Needs, NP  rosuvastatin (CRESTOR) 5 MG tablet TAKE ONE TABLET BY MOUTH ON MONDAY, WEDNESDAY AND FRIDAY AT BEDTIME FOR CHOLESTEROL Patient taking differently: Take  5 mg by mouth See admin instructions. Jory Sims, Frid 10/31/19  Yes Pleas Koch, NP  sulfamethoxazole-trimethoprim (BACTRIM DS) 800-160 MG tablet Take 1 tablet by mouth 2 (two) times daily for 3 days. 01/12/21 01/15/21 Yes Wynona Dove A, DO  torsemide (DEMADEX) 20 MG tablet Take 20 mg by mouth daily. 01/11/21  Yes [provider]  meclizine (ANTIVERT) 12.5 MG tablet TAKE 1 TABLET (12.5 MG TOTAL) BY MOUTH 3 (THREE) TIMES DAILY AS NEEDED FOR DIZZINESS. Patient not taking: Reported on 01/12/2021 10/31/19   Pleas Koch, NP    Allergies    Albuterol, Cefdinir, Lactose intolerance (gi), Levofloxacin, Other, Adhesive [tape], Epinephrine, and Latex  Review of Systems   Review of Systems  Unable to perform ROS: Dementia   Physical Exam Updated Vital Signs BP (!) 111/42   Pulse 72   Temp 98.3 F (36.8 C) (Oral)   Resp 15   Ht 5\' 4"  (1.626 m)   Wt 66.2 kg   SpO2 96%   BMI 25.06 kg/m   Physical Exam Vitals and nursing note reviewed.  Constitutional:      General: She is not in acute distress.    Appearance: Normal appearance.  HENT:     Head: Normocephalic and atraumatic.     Right Ear: External ear normal.     Left Ear: External ear normal.     Nose: Nose normal.     Mouth/Throat:     Mouth: Mucous membranes are moist.  Eyes:     General: No scleral icterus.       Right eye: No discharge.        Left eye: No discharge.  Cardiovascular:     Rate and Rhythm: Normal rate and regular rhythm.     Pulses: Normal pulses.      Heart sounds: Normal heart sounds.  Pulmonary:     Effort: Pulmonary effort is normal. No respiratory distress.     Breath sounds: Normal breath sounds.  Abdominal:     General: Abdomen is flat.     Tenderness: There is no abdominal tenderness.  Musculoskeletal:        General: Normal range of motion.     Cervical back: Normal range of motion.     Right lower leg: No edema.     Left lower leg: No edema.  Skin:    General: Skin is warm and dry.     Capillary Refill: Capillary refill takes less than 2 seconds.  Neurological:     Mental Status: She is alert. Mental status is at baseline.     Comments: Moving all extremities spontaneously  Psychiatric:        Attention and Perception: Attention normal.        Behavior: Behavior is cooperative.    ED Results / Procedures / Treatments   Labs (all labs ordered are listed, but only abnormal results are displayed) Labs Reviewed  URINALYSIS, ROUTINE W REFLEX MICROSCOPIC - Abnormal; Notable for the following components:      Result Value   APPearance CLOUDY (*)    Leukocytes,Ua SMALL (*)    Bacteria, UA MANY (*)    All other components within normal limits  SALICYLATE LEVEL - Abnormal; Notable for the following components:   Salicylate Lvl <9.6 (*)    All other components within normal limits  ACETAMINOPHEN LEVEL - Abnormal; Notable for the following components:   Acetaminophen (Tylenol), Serum <10 (*)    All other components within normal limits  BLOOD GAS, VENOUS - Abnormal; Notable for the following components:   pO2, Ven <31.0 (*)    Acid-Base Excess 3.1 (*)    All other components within normal limits  RESP PANEL BY RT-PCR (FLU A&B, COVID) ARPGX2  URINE CULTURE  CBC WITH DIFFERENTIAL/PLATELET  COMPREHENSIVE METABOLIC PANEL  TSH  CBG MONITORING, ED  TROPONIN I (HIGH SENSITIVITY)  TROPONIN I (HIGH SENSITIVITY)    EKG EKG Interpretation  Date/Time:  Wednesday January 12 2021 08:59:21 EST Ventricular Rate:  69 PR  Interval:  222 QRS Duration: 137 QT Interval:  451 QTC Calculation: 484 R Axis:   42 Text Interpretation: Sinus rhythm Prolonged PR interval Left bundle branch block Similar to prior tracing Confirmed by Wynona Dove ([id number]) on 01/12/2021 12:30:42 PM  Radiology CT Head Wo Contrast  Result Date: 01/12/2021 CLINICAL DATA:  Delirium EXAM: CT HEAD WITHOUT CONTRAST TECHNIQUE: Contiguous axial images were obtained from the base of the skull through the vertex without intravenous contrast. COMPARISON:  MRI head 09/27/2017 FINDINGS: Brain: Moderate atrophy, unchanged. Moderate to extensive white matter hypodensity bilaterally. Negative for acute cortical infarct, hemorrhage, mass Vascular: Negative for hyperdense vessel Skull: Negative Sinuses/Orbits: Paranasal sinuses clear. Bilateral cataract extraction Other: None IMPRESSION: No acute abnormality Atrophy and chronic microvascular ischemic changes. Electronically Signed   By: Franchot Gallo M.D.   On: 01/12/2021 10:04   CT Lumbar Spine Wo Contrast  Result Date: 01/12/2021 CLINICAL DATA:  Low back pain.  Increased fracture risk. EXAM: CT LUMBAR SPINE WITHOUT CONTRAST TECHNIQUE: Multidetector CT imaging of the lumbar spine was performed without intravenous contrast administration. Multiplanar CT image reconstructions were also generated. COMPARISON:  Prior MR examination 01/22/2020 FINDINGS: Segmentation: There are five lumbar type vertebral bodies. The last full intervertebral disc space is labeled L5-S1. Alignment: Normal Vertebrae: Remote T12 fracture is noted. No retropulsion. No acute lumbar spine fractures are identified. There is moderate age related osteoporosis. The facets are normally aligned. No facet or lamina fractures. Paraspinal and other soft tissues: No significant paraspinal or retroperitoneal findings. Advanced atherosclerotic calcifications involving the aorta and iliac arteries. Stable probable large right sided uterine fibroid. Disc  levels: T11-12: No significant findings. T12-L1: No significant findings. L1-2: No significant findings. L2-3: Bulging annulus and mild facet disease contributing to mild spinal and lateral recess stenosis. No significant foraminal stenosis. L3-4: Bulging annulus and moderate facet disease with ligamentum flavum thickening contributing to moderate spinal and bilateral lateral recess stenosis. No significant foraminal stenosis. L4-5: Bulging degenerated annulus, facet disease and ligamentum flavum thickening with mild to moderate spinal and bilateral lateral recess stenosis. No foraminal stenosis. L5-S1: No significant findings. IMPRESSION: 1. Remote T12 fracture. 2. No acute lumbar spine fracture. 3. Moderate spinal and bilateral lateral recess stenosis at L3-4 and L4-5. 4. Advanced atherosclerotic calcifications involving the aorta and iliac arteries. 5. Stable probable large right sided uterine fibroid. 6. Aortic atherosclerosis. Aortic Atherosclerosis (ICD10-I70.0). Electronically Signed   By: Marijo Sanes M.D.   On: 01/12/2021 16:22   DG Chest Portable 1 View  Result Date: 01/12/2021 CLINICAL DATA:  Altered mental status EXAM: PORTABLE CHEST 1 VIEW COMPARISON:  12/10/2019 FINDINGS: The heart size and mediastinal contours are within normal limits. Both lungs are clear. The visualized skeletal structures are unremarkable. IMPRESSION: No active disease. Electronically Signed   By: Franchot Gallo M.D.   On: 01/12/2021 08:53    Procedures Procedures   Medications Ordered in ED Medications  ALPRAZolam Duanne Moron) tablet 0.25 mg (has no administration in time range)  gabapentin (  NEURONTIN) capsule 100 mg (has no administration in time range)  hyoscyamine (LEVBID) 0.375 MG 12 hr tablet 0.375 mg (has no administration in time range)  OXcarbazepine (TRILEPTAL) tablet 150 mg (has no administration in time range)  rosuvastatin (CRESTOR) tablet 5 mg (has no administration in time range)  torsemide (DEMADEX)  tablet 20 mg (has no administration in time range)  sulfamethoxazole-trimethoprim (BACTRIM DS) 800-160 MG per tablet 1 tablet (1 tablet Oral Given 01/12/21 1607)  pantoprazole (PROTONIX) EC tablet 40 mg (40 mg Oral Given 01/12/21 1607)  acetaminophen (TYLENOL) tablet 650 mg (has no administration in time range)    ED Course  I have reviewed the triage vital signs and the nursing notes.  Pertinent labs & imaging results that were available during my care of the patient were reviewed by me and considered in my medical decision making (see chart for details).  Clinical Course as of 01/12/21 1844  Wed Jan 12, 2021  1618 Pt is pending authorization for insurance for SNF placement in [place], plan to board pt in ED until authorization for SNF is obtained.  [SG]    Clinical Course User Index [SG] Jeanell Sparrow, DO   MDM Rules/Calculators/A&P                           CC: Possible UTI, abnormal urine culture at outside facility   This patient to the ED for evaluation of UTI; this involves an extensive number of treatment options and is a complaint that carries with it a high risk of complications and morbidity. Vital signs were reviewed. Serious etiologies considered.  Record review:  Previous records obtained and reviewed   Additional history obtained from son  Work up as above, notable for: Labs & imaging results that were available during my care of the patient were reviewed by me and considered in my medical decision making.   I ordered imaging studies which included CTH CXR and I independently visualized and interpreted imaging which showed no acute process.   Labs today remain stable, no evidence of sepsis, ACS is unlikely given negative trop and EKG.  Management: Family looking for SNF options, SW consulted  Reassessment:  Pt remains asymptomatic, physical exam remains re-assuring.    Reviewed urine culture from outside facility, see photo below. UA sample today without acute  infection, culture sent. She is on ppx macrobid. Renal function wnl today, will give her bactrim as urine culture was sensitive to bactrim.         Pt with significant debility at baseline, she was ambulatory prior to starting at memory care. Family reports there is no physical therapy being conducted at current facility and her physical ability recently has declined significantly. Pt unable to ambulate or transfer from bed to chair without assist. Pt would likely benefit from SNF placement, will d/w SW and PT.  D/w SW and PT, pt meets criteria for rehab placement; family agreeable to SNF.   Will give her oral abx for presumed UTI although her UA today is negative, she did have urine culture at nursing home which did show gram negative rods sensitive to bactrim. Started in ED.     At this time plan discharge to SNF per SW. Pt will need to board in the ED until her auth for medicaid is approved. Home meds/diet ordered. Family updated on plan.       This chart was dictated using voice recognition software.  Despite best efforts  to proofread,  errors can occur which can change the documentation meaning.  Final Clinical Impression(s) / ED Diagnoses Final diagnoses:  Difficulty with activities of daily living  Urinary tract infection without hematuria, site unspecified  Dementia, unspecified dementia severity, unspecified dementia type, unspecified whether behavioral, psychotic, or mood disturbance or anxiety ([place])    Rx / DC Orders ED Discharge Orders          Ordered    sulfamethoxazole-trimethoprim (BACTRIM DS) 800-160 MG tablet  2 times daily        01/12/21 Pringle, [place], DO 01/12/21 Gracey, [place], DO 01/12/21 1844

## 2021-01-12 NOTE — NC FL2 (Signed)
[place] MEDICAID FL2 LEVEL OF CARE SCREENING TOOL     IDENTIFICATION  Patient Name: Shari Vincent Birthdate: [birth year] Sex: female Admission Date (Current Location): 01/12/2021  [facility] and [place] Number:  [employer] and Address:  [facility] [address], [place]      Provider Number: [id number]  Attending Physician Name and Address:  Jeanell Sparrow, DO  Relative Name and Phone Number:  Shavette, [place])   [phone]    Current Level of Care: Hospital Recommended Level of Care: [place] Prior Approval Number:    Date Approved/Denied:   PASRR Number: [id number]  Discharge Plan: SNF    Current Diagnoses: Patient Active Problem List   Diagnosis Date Noted   Acute cystitis 02/11/2019   Memory changes 02/11/2019   Syncope 03/07/2018   TIA (transient ischemic attack) 09/27/2017   Preventative health care 12/13/2016   Urinary incontinence 10/04/2016   Family hx-stroke 06/27/2016   Cerebral amyloid angiopathy (CODE) 06/26/2016   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   Subarachnoid hemorrhage ([place]) 06/25/2016   Upper extremity weakness 06/23/2016   Osteoporosis 10/22/2014   Pelvic mass in female 08/25/2014   PMB (postmenopausal bleeding) 08/06/2014   Medicare annual wellness visit, subsequent 06/17/2014   IBS (irritable bowel syndrome) 05/26/2014   Right-sided low back pain with right-sided sciatica 05/26/2014   Trigeminal neuralgia 05/26/2014   OA (osteoarthritis) of knee 07/23/2012   Venous stasis 07/23/2012   COLONIC POLYPS, HYPERPLASTIC 08/14/2008   Hyperlipidemia 08/14/2008   GERD 08/14/2008   VERTIGO 08/14/2008    Orientation RESPIRATION BLADDER Height & Weight     Self, Place, Situation  Normal Incontinent Weight: 146 lb (66.2 kg) Height:  5\' 4"  (162.6 cm)  BEHAVIORAL SYMPTOMS/MOOD NEUROLOGICAL BOWEL NUTRITION STATUS      Incontinent Diet (Heart healthy)  AMBULATORY STATUS COMMUNICATION OF  NEEDS Skin   Extensive Assist Verbally (Limited verbal communication) Normal                       Personal Care Assistance Level of Assistance  Bathing, Feeding, Dressing, Total care Bathing Assistance: Maximum assistance Feeding assistance: Limited assistance Dressing Assistance: Maximum assistance Total Care Assistance: Maximum assistance   Functional Limitations Info  Sight, Hearing, Speech Sight Info: Adequate Hearing Info: Adequate Speech Info: Adequate    SPECIAL CARE FACTORS FREQUENCY  PT (By licensed PT), OT (By licensed OT)     PT Frequency: 5 times weekly OT Frequency: 5 times weekly            Contractures Contractures Info: Not present    Additional Factors Info  Code Status Code Status Info: DNR             Current Medications (01/12/2021):  This is the current hospital active medication list No current facility-administered medications for this encounter.   Current Outpatient Medications  Medication Sig Dispense Refill   ALPRAZolam (XANAX) 0.25 MG tablet Take 1 tablet (0.25 mg total) by mouth at bedtime as needed for anxiety. 30 tablet 0   Cranberry 250 MG CAPS Take 1 capsule by mouth daily.     gabapentin (NEURONTIN) 100 MG capsule TAKE ONE CAPSULE BY MOUTH AT BEDTIME FOR BACK PAIN (Patient taking differently: Take 100 mg by mouth at bedtime. For seizures) 90 capsule 1   hyoscyamine (LEVBID) 0.375 MG 12 hr tablet Take 1 tablet (0.375 mg total) by mouth every morning. (Patient taking differently: Take 0.375 mg by mouth daily as needed for  cramping.) 30 tablet 5   Lacosamide (VIMPAT) 100 MG TABS Take 1 tablet (100 mg total) by mouth 2 (two) times daily. Appointment needed for further refills. 180 tablet 1   Lactobacillus (ACIDOPHILUS PO) Take 1 capsule by mouth daily. 500 million. Equiv to Azo Complete Feminine Bal     Lactobacillus-Inulin (VIACTIV DIGESTIVE HEALTH) CHEW Chew 1 tablet by mouth daily.     nitrofurantoin, macrocrystal-monohydrate,  (MACROBID) 100 MG capsule Take 100 mg by mouth daily.     Oral Electrolytes (PEDIATRIC ELECTROLYTE-ZINC) SOLN Take 14 mLs by mouth in the morning and at bedtime.     OXcarbazepine (TRILEPTAL) 150 MG tablet TAKE 1 TABLET (150 MG TOTAL) BY MOUTH 2 (TWO) TIMES DAILY. 60 tablet 5   pantoprazole (PROTONIX) 20 MG tablet Take 1 tablet (20 mg total) by mouth 2 (two) times daily. 180 tablet 3   rosuvastatin (CRESTOR) 5 MG tablet TAKE ONE TABLET BY MOUTH ON MONDAY, WEDNESDAY AND FRIDAY AT BEDTIME FOR CHOLESTEROL (Patient taking differently: Take 5 mg by mouth See admin instructions. Mon, Wed, Frid) 39 tablet 3   sulfamethoxazole-trimethoprim (BACTRIM DS) 800-160 MG tablet Take 1 tablet by mouth 2 (two) times daily for 3 days. 6 tablet 0   torsemide (DEMADEX) 20 MG tablet Take 20 mg by mouth daily.     meclizine (ANTIVERT) 12.5 MG tablet TAKE 1 TABLET (12.5 MG TOTAL) BY MOUTH 3 (THREE) TIMES DAILY AS NEEDED FOR DIZZINESS. (Patient not taking: Reported on 01/12/2021) 30 tablet 0     Discharge Medications: Please see discharge summary for a list of discharge medications.  Relevant Imaging Results:  Relevant Lab Results:   Additional Information SSN: [id number] [place]

## 2021-01-12 NOTE — ED Notes (Signed)
TOC consulted for placement needs. CSW met with pt and son in room. Pts son states that pt has been at ALF for the past year. Pt does not walk much at this time however, she was ambulating when admitted to the facility. CSW explained to pts son the process of getting pt into long term memory care, that this is not a fast placement and that pt would need a payor source. Pt does not have Medicaid at this time. CSW informed pts son that he should begin working on medicaid application for pt. CSW explained that if Dr. Gray feels pt would benefit from PT consult there is a possibility that pt could qualify for SNF placement. Pts son is thankful for any assistance. CSW explained that if pt does not qualify for SNF based on the PT recommendation she will need to return to the ALF and her son will need to work on the Medicaid application. CSW spoke to Dr. Gray who states he will place PT consult. TOC to follow.  

## 2021-01-12 NOTE — ED Notes (Signed)
CSW spoke to [place] with [facility] who states that they can make a bed offer on pt. CSW spoke with pts son who states they would like to accept bed at [facility].Facility states they cannot accept pt until Friday for admission also pending insurance auth. CSW working on [employer] fast streamer. TOC to follow.

## 2021-01-12 NOTE — Evaluation (Signed)
Physical Therapy Evaluation Patient Details Name: Shari Vincent MRN: [id number] DOB: [birth year] Today's Date: 01/12/2021  History of Present Illness  Shari Vincent is a 87 y/o female with c/o generalized weakness  Clinical Impression  Patient presents alert and agreeable for therapy - her son present at bedside.  Patient demonstrates slow labored movement for sitting up at bedside limited mostly due to generalized weakness and low back pain, once seated unable to come to complete standing due to posterior leaning and difficulty flexing knees during sit to stands.  Patient required frequent verbal/tactile cueing for proper placement of feet when attempting sit to stands with fair/poor carryover and unable to transfer to chair.  Patient put back to bed with Max assist to reposition mostly due to c/o increasing low back pain.  Patient will benefit from continued physical therapy in hospital and recommended venue below to increase strength, balance, endurance for safe ADLs and gait.         Recommendations for follow up therapy are one component of a multi-disciplinary discharge planning process, led by the attending physician.  Recommendations may be updated based on patient status, additional functional criteria and insurance authorization.  Follow Up Recommendations Skilled nursing-short term rehab (<3 hours/day)    Assistance Recommended at Discharge Intermittent Supervision/Assistance  Functional Status Assessment Patient has had a recent decline in their functional status and/or demonstrates limited ability to make significant improvements in function in a reasonable and predictable amount of time  Equipment Recommendations  None recommended by PT    Recommendations for Other Services       Precautions / Restrictions Precautions Precautions: Fall Restrictions Weight Bearing Restrictions: No      Mobility  Bed Mobility Overal bed mobility: Needs Assistance Bed Mobility:  Supine to Sit;Sit to Supine     Supine to sit: Mod assist Sit to supine: Mod assist;Max assist   General bed mobility comments: increased time, labored movement, limited mostly due to c/o low back pain    Transfers Overall transfer level: Needs assistance Equipment used: Rolling walker (2 wheels) Transfers: Sit to/from Stand Sit to Stand: Max assist           General transfer comment: patient unable to maintain standing balance due to feet sliding forward and posterior leaning    Ambulation/Gait                  Stairs            Wheelchair Mobility    Modified Rankin (Stroke Patients Only)       Balance Overall balance assessment: Needs assistance Sitting-balance support: Feet supported;No upper extremity supported Sitting balance-Leahy Scale: Poor Sitting balance - Comments: fair/poor seated at EOB Postural control: Posterior lean Standing balance support: During functional activity;Bilateral upper extremity supported;Reliant on assistive device for balance Standing balance-Leahy Scale: Poor Standing balance comment: using RW                             Pertinent Vitals/Pain Pain Assessment: Faces Faces Pain Scale: Hurts even more Pain Location: low back Pain Descriptors / Indicators: Grimacing;Guarding    Home Living Family/patient expects to be discharged to:: Assisted living                 Home Equipment: Rollator (4 wheels)      Prior Function Prior Level of Function : Needs assist  Cognitive Assist : Mobility (cognitive) Mobility (Cognitive): Step by step cues  Physical Assist : Mobility (physical);ADLs (physical) Mobility (physical): Bed mobility;Transfers;Gait ADLs (physical): Bathing;Dressing Mobility Comments: Was ambulating using Rollator approximately 3 months ago, recently requiring mechanical lift mostly due to poor carryover for following instructions ADLs Comments: Assisted by ALF staff     Hand  Dominance        Extremity/Trunk Assessment   Upper Extremity Assessment Upper Extremity Assessment: Generalized weakness    Lower Extremity Assessment Lower Extremity Assessment: Generalized weakness    Cervical / Trunk Assessment Cervical / Trunk Assessment: Kyphotic  Communication   Communication: No difficulties  Cognition Arousal/Alertness: Awake/alert Behavior During Therapy: WFL for tasks assessed/performed Overall Cognitive Status: History of cognitive impairments - at baseline                                          General Comments      Exercises     Assessment/Plan    PT Assessment Patient needs continued PT services  PT Problem List Decreased strength;Decreased activity tolerance;Decreased balance;Decreased mobility       PT Treatment Interventions DME instruction;Gait training;Functional mobility training;Therapeutic activities;Therapeutic exercise;Balance training;Cognitive remediation;Patient/family education    PT Goals (Current goals can be found in the Care Plan section)  Acute Rehab PT Goals Patient Stated Goal: return home PT Goal Formulation: With patient/family Time For Goal Achievement: 01/26/21 Potential to Achieve Goals: Fair    Frequency Min 2X/week   Barriers to discharge        Co-evaluation               AM-PAC PT "6 Clicks" Mobility  Outcome Measure Help needed turning from your back to your side while in a flat bed without using bedrails?: A Lot Help needed moving from lying on your back to sitting on the side of a flat bed without using bedrails?: A Lot Help needed moving to and from a bed to a chair (including a wheelchair)?: Total Help needed standing up from a chair using your arms (e.g., wheelchair or bedside chair)?: Total Help needed to walk in hospital room?: Total   6 Click Score: 7    End of Session   Activity Tolerance: Patient tolerated treatment well;Patient limited by fatigue;Patient  limited by pain Patient left: in bed;with call bell/phone within reach;with family/visitor present Nurse Communication: Mobility status PT Visit Diagnosis: Unsteadiness on feet (R26.81);Other abnormalities of gait and mobility (R26.89);Muscle weakness (generalized) (M62.81)    Time: 2800-3491 PT Time Calculation (min) (ACUTE ONLY): 27 min   Charges:   PT Evaluation $PT Eval Moderate Complexity: 1 Mod PT Treatments $Therapeutic Activity: 23-37 mins        12:34 PM, 01/12/21 Lonell Grandchild, MPT Physical Therapist with [facility] [phone] office [phone] mobile phone

## 2021-01-12 NOTE — Plan of Care (Signed)
  Problem: Acute Rehab PT Goals(only PT should resolve) Goal: Pt Will Go Supine/Side To Sit Outcome: Progressing Flowsheets (Taken 01/12/2021 1238) Pt will go Supine/Side to Sit: with minimal assist Goal: Patient Will Transfer Sit To/From Stand Outcome: Progressing Flowsheets (Taken 01/12/2021 1238) Patient will transfer sit to/from stand:  with minimal assist  with moderate assist Goal: Pt Will Transfer Bed To Chair/Chair To Bed Outcome: Progressing Flowsheets (Taken 01/12/2021 1238) Pt will Transfer Bed to Chair/Chair to Bed:  with mod assist  with max assist Goal: Pt Will Ambulate Outcome: Progressing Flowsheets (Taken 01/12/2021 1238) Pt will Ambulate:  10 feet  with moderate assist  with maximum assist  with rolling walker   12:38 PM, 01/12/21 Lonell Grandchild, MPT Physical Therapist with [facility] [phone] office [phone] mobile phone

## 2021-01-12 NOTE — ED Triage Notes (Signed)
Pt arrived from [place] with UTI, staff states that pt needs to be admitted for IV antibiotics due to allergies to most oral options  Pt has chronic UTIs. Main symptoms are AMS and pt can get combative

## 2021-01-13 NOTE — ED Notes (Signed)
Pt daughter at bedside- recliner placed in pt room for daughter.

## 2021-01-13 NOTE — ED Notes (Signed)
Recliner brought in room for pt daughter

## 2021-01-14 DIAGNOSIS — Z7401 Bed confinement status: Secondary | ICD-10-CM | POA: Diagnosis not present

## 2021-01-14 DIAGNOSIS — F0393 Unspecified dementia, unspecified severity, with mood disturbance: Secondary | ICD-10-CM | POA: Diagnosis not present

## 2021-01-14 DIAGNOSIS — G603 Idiopathic progressive neuropathy: Secondary | ICD-10-CM | POA: Diagnosis not present

## 2021-01-14 DIAGNOSIS — E785 Hyperlipidemia, unspecified: Secondary | ICD-10-CM | POA: Diagnosis not present

## 2021-01-14 DIAGNOSIS — R279 Unspecified lack of coordination: Secondary | ICD-10-CM | POA: Diagnosis not present

## 2021-01-14 DIAGNOSIS — R569 Unspecified convulsions: Secondary | ICD-10-CM | POA: Diagnosis not present

## 2021-01-14 DIAGNOSIS — R0902 Hypoxemia: Secondary | ICD-10-CM | POA: Diagnosis not present

## 2021-01-14 DIAGNOSIS — F0390 Unspecified dementia without behavioral disturbance: Secondary | ICD-10-CM | POA: Diagnosis not present

## 2021-01-14 DIAGNOSIS — I959 Hypotension, unspecified: Secondary | ICD-10-CM | POA: Diagnosis not present

## 2021-01-14 DIAGNOSIS — N390 Urinary tract infection, site not specified: Secondary | ICD-10-CM | POA: Diagnosis not present

## 2021-01-14 DIAGNOSIS — G40909 Epilepsy, unspecified, not intractable, without status epilepticus: Secondary | ICD-10-CM | POA: Diagnosis not present

## 2021-01-14 DIAGNOSIS — R5381 Other malaise: Secondary | ICD-10-CM | POA: Diagnosis not present

## 2021-01-14 DIAGNOSIS — K9289 Other specified diseases of the digestive system: Secondary | ICD-10-CM | POA: Diagnosis not present

## 2021-01-14 LAB — URINE CULTURE: Culture: >=100000 — AB

## 2021-01-14 MED ORDER — ALPRAZOLAM 0.25 MG PO TABS: 0.2500 mg | ORAL_TABLET | Freq: Every evening | ORAL | 0 refills | Status: AC | PRN

## 2021-01-14 NOTE — ED Notes (Signed)
Pt meal tray delivered to room. Pt son in room states he will feed the pt.

## 2021-01-14 NOTE — ED Notes (Signed)
Pt brief checked, purewick in place and pt dry. Daughter remains at bedside. No concerns voiced at this time.

## 2021-01-14 NOTE — ED Notes (Signed)
CSW spoke to [place] with [facility] who states they can accept pt today for admission,. CSW provided [place] with insurance auth number. CSW faxed med list, AVS, COVID test results, and copy of hard script to [facility]. CSW provided Rn with number for report. EMS has been called and pt is on list.   CSW updated ED staff that pt cannot leave ED if she will not be in the building by 8pm.   CSW updated pt and pts son in room that everything has been sent and EMS has been called.   TOC signing off.

## 2021-01-14 NOTE — ED Notes (Addendum)
Pt became agitated when trying to place BP cuff, pulse ox, & cardiac monitoring leads on. Pt yelling and swinging at the staff stating "leave me alone!" Pt has stripped her gown off while this tech tried to get her redressed pt swung and hit in the eye. Another tech at bedside assisted with getting BP cuff & pulse ox on pt and slid up in bed. RN notified.

## 2021-01-14 NOTE — ED Provider Notes (Signed)
Current medications:  Acetaminophen 650 mg every 4 hours as needed pain Alprazolam 0.25 mg at bedtime, as needed anxiety Gabapentin 100 mg at bedtime Hyoscyamine 0.375 mg daily, as needed abdominal cramping Trileptal 150 mg twice daily Pantoprazole 40 mg daily Rosuvastatin 5 mg daily Sulfamethoxazole-trimethoprim 800-160 mg twice daily x4 days Torsemide 20 mg daily     Daleen Bo, MD 01/14/21 1048

## 2021-01-15 ENCOUNTER — Telehealth: Payer: Self-pay | Admitting: Emergency Medicine

## 2021-01-15 NOTE — Telephone Encounter (Signed)
Post ED Visit - Positive Culture Follow-up  Culture report reviewed by antimicrobial stewardship pharmacist: [place] Team []  Elenor Quinones, Pharm.D. []  Heide Guile, Pharm.D., BCPS AQ-ID []  Parks Neptune, Pharm.D., BCPS []  Alycia Rossetti, Pharm.D., BCPS []  [place], [place].D., BCPS, AAHIVP []  Legrand Como, Pharm.D., BCPS, AAHIVP []  Salome Arnt, PharmD, BCPS []  Johnnette Gourd, PharmD, BCPS []  Hughes Better, PharmD, BCPS [x]  Joetta Manners, PharmD []  Laqueta Linden, PharmD, BCPS []  Albertina Parr, PharmD  [place] Team []  Leodis Sias, PharmD []  Lindell Spar, PharmD []  Royetta Asal, PharmD []  Graylin Shiver, Rph []  Rema Fendt) Glennon Mac, PharmD []  Arlyn Dunning, PharmD []  Netta Cedars, PharmD []  Dia Sitter, PharmD []  Leone Haven, PharmD []  Gretta Arab, PharmD []  Theodis Shove, PharmD []  Peggyann Juba, PharmD []  Reuel Boom, PharmD   Positive urine culture Treated with Sulfamethoxazole-Trimethoprim, organism sensitive to the same and no further patient follow-up is required at this time.  Sandi Raveling Saksham Akkerman 01/15/2021, 1:41 PM

## 2021-01-17 DIAGNOSIS — R5381 Other malaise: Secondary | ICD-10-CM | POA: Diagnosis not present

## 2021-01-17 DIAGNOSIS — E785 Hyperlipidemia, unspecified: Secondary | ICD-10-CM | POA: Diagnosis not present

## 2021-01-17 DIAGNOSIS — G40909 Epilepsy, unspecified, not intractable, without status epilepticus: Secondary | ICD-10-CM | POA: Diagnosis not present

## 2021-01-17 DIAGNOSIS — F0393 Unspecified dementia, unspecified severity, with mood disturbance: Secondary | ICD-10-CM | POA: Diagnosis not present

## 2021-01-24 DIAGNOSIS — R5381 Other malaise: Secondary | ICD-10-CM | POA: Diagnosis not present

## 2021-01-24 DIAGNOSIS — E785 Hyperlipidemia, unspecified: Secondary | ICD-10-CM | POA: Diagnosis not present

## 2021-01-24 DIAGNOSIS — F0393 Unspecified dementia, unspecified severity, with mood disturbance: Secondary | ICD-10-CM | POA: Diagnosis not present

## 2021-01-24 DIAGNOSIS — G40909 Epilepsy, unspecified, not intractable, without status epilepticus: Secondary | ICD-10-CM | POA: Diagnosis not present

## 2021-02-07 DIAGNOSIS — E785 Hyperlipidemia, unspecified: Secondary | ICD-10-CM | POA: Diagnosis not present

## 2021-02-07 DIAGNOSIS — F0393 Unspecified dementia, unspecified severity, with mood disturbance: Secondary | ICD-10-CM | POA: Diagnosis not present

## 2021-02-07 DIAGNOSIS — R5381 Other malaise: Secondary | ICD-10-CM | POA: Diagnosis not present

## 2021-02-07 DIAGNOSIS — G40909 Epilepsy, unspecified, not intractable, without status epilepticus: Secondary | ICD-10-CM | POA: Diagnosis not present

## 2021-02-24 DIAGNOSIS — F0393 Unspecified dementia, unspecified severity, with mood disturbance: Secondary | ICD-10-CM | POA: Diagnosis not present

## 2021-02-24 DIAGNOSIS — E785 Hyperlipidemia, unspecified: Secondary | ICD-10-CM | POA: Diagnosis not present

## 2021-02-24 DIAGNOSIS — R5381 Other malaise: Secondary | ICD-10-CM | POA: Diagnosis not present

## 2021-02-24 DIAGNOSIS — G40909 Epilepsy, unspecified, not intractable, without status epilepticus: Secondary | ICD-10-CM | POA: Diagnosis not present

## 2021-03-24 DIAGNOSIS — E785 Hyperlipidemia, unspecified: Secondary | ICD-10-CM | POA: Diagnosis not present

## 2021-03-24 DIAGNOSIS — G40909 Epilepsy, unspecified, not intractable, without status epilepticus: Secondary | ICD-10-CM | POA: Diagnosis not present

## 2021-03-24 DIAGNOSIS — F0393 Unspecified dementia, unspecified severity, with mood disturbance: Secondary | ICD-10-CM | POA: Diagnosis not present

## 2021-03-24 DIAGNOSIS — R5381 Other malaise: Secondary | ICD-10-CM | POA: Diagnosis not present

## 2021-05-26 DIAGNOSIS — K219 Gastro-esophageal reflux disease without esophagitis: Secondary | ICD-10-CM | POA: Diagnosis not present

## 2021-05-26 DIAGNOSIS — E785 Hyperlipidemia, unspecified: Secondary | ICD-10-CM | POA: Diagnosis not present

## 2021-05-26 DIAGNOSIS — R5381 Other malaise: Secondary | ICD-10-CM | POA: Diagnosis not present

## 2021-05-26 DIAGNOSIS — G40909 Epilepsy, unspecified, not intractable, without status epilepticus: Secondary | ICD-10-CM | POA: Diagnosis not present

## 2021-05-26 DIAGNOSIS — F0390 Unspecified dementia without behavioral disturbance: Secondary | ICD-10-CM | POA: Diagnosis not present

## 2021-06-21 DIAGNOSIS — L603 Nail dystrophy: Secondary | ICD-10-CM | POA: Diagnosis not present

## 2021-06-21 DIAGNOSIS — G603 Idiopathic progressive neuropathy: Secondary | ICD-10-CM | POA: Diagnosis not present

## 2021-06-21 DIAGNOSIS — F0390 Unspecified dementia without behavioral disturbance: Secondary | ICD-10-CM | POA: Diagnosis not present

## 2021-07-27 DIAGNOSIS — R569 Unspecified convulsions: Secondary | ICD-10-CM | POA: Diagnosis not present

## 2021-07-27 DIAGNOSIS — E785 Hyperlipidemia, unspecified: Secondary | ICD-10-CM | POA: Diagnosis not present

## 2021-07-27 DIAGNOSIS — F0390 Unspecified dementia without behavioral disturbance: Secondary | ICD-10-CM | POA: Diagnosis not present

## 2021-07-27 DIAGNOSIS — K219 Gastro-esophageal reflux disease without esophagitis: Secondary | ICD-10-CM | POA: Diagnosis not present

## 2021-07-27 DIAGNOSIS — R5381 Other malaise: Secondary | ICD-10-CM | POA: Diagnosis not present

## 2021-07-28 DIAGNOSIS — E785 Hyperlipidemia, unspecified: Secondary | ICD-10-CM | POA: Diagnosis not present

## 2021-09-23 DIAGNOSIS — F0390 Unspecified dementia without behavioral disturbance: Secondary | ICD-10-CM | POA: Diagnosis not present

## 2021-09-23 DIAGNOSIS — E785 Hyperlipidemia, unspecified: Secondary | ICD-10-CM | POA: Diagnosis not present

## 2021-09-23 DIAGNOSIS — R569 Unspecified convulsions: Secondary | ICD-10-CM | POA: Diagnosis not present

## 2021-09-23 DIAGNOSIS — R5381 Other malaise: Secondary | ICD-10-CM | POA: Diagnosis not present

## 2021-11-25 DIAGNOSIS — E785 Hyperlipidemia, unspecified: Secondary | ICD-10-CM | POA: Diagnosis not present

## 2021-11-25 DIAGNOSIS — F0390 Unspecified dementia without behavioral disturbance: Secondary | ICD-10-CM | POA: Diagnosis not present

## 2022-01-26 DIAGNOSIS — E785 Hyperlipidemia, unspecified: Secondary | ICD-10-CM | POA: Diagnosis not present

## 2022-01-26 DIAGNOSIS — F0390 Unspecified dementia without behavioral disturbance: Secondary | ICD-10-CM | POA: Diagnosis not present

## 2022-01-30 DIAGNOSIS — G603 Idiopathic progressive neuropathy: Secondary | ICD-10-CM | POA: Diagnosis not present

## 2022-01-30 DIAGNOSIS — N390 Urinary tract infection, site not specified: Secondary | ICD-10-CM | POA: Diagnosis not present

## 2022-01-30 DIAGNOSIS — E785 Hyperlipidemia, unspecified: Secondary | ICD-10-CM | POA: Diagnosis not present

## 2022-01-30 DIAGNOSIS — F0390 Unspecified dementia without behavioral disturbance: Secondary | ICD-10-CM | POA: Diagnosis not present

## 2022-01-30 DIAGNOSIS — R569 Unspecified convulsions: Secondary | ICD-10-CM | POA: Diagnosis not present

## 2022-03-23 DIAGNOSIS — F0390 Unspecified dementia without behavioral disturbance: Secondary | ICD-10-CM | POA: Diagnosis not present

## 2022-03-23 DIAGNOSIS — E785 Hyperlipidemia, unspecified: Secondary | ICD-10-CM | POA: Diagnosis not present

## 2022-05-23 DIAGNOSIS — E785 Hyperlipidemia, unspecified: Secondary | ICD-10-CM | POA: Diagnosis not present

## 2022-05-23 DIAGNOSIS — F0390 Unspecified dementia without behavioral disturbance: Secondary | ICD-10-CM | POA: Diagnosis not present

## 2022-07-25 DIAGNOSIS — F0390 Unspecified dementia without behavioral disturbance: Secondary | ICD-10-CM | POA: Diagnosis not present

## 2022-07-25 DIAGNOSIS — E785 Hyperlipidemia, unspecified: Secondary | ICD-10-CM | POA: Diagnosis not present

## 2022-09-04 DIAGNOSIS — F419 Anxiety disorder, unspecified: Secondary | ICD-10-CM | POA: Diagnosis not present

## 2022-09-04 DIAGNOSIS — L603 Nail dystrophy: Secondary | ICD-10-CM | POA: Diagnosis not present

## 2022-09-04 DIAGNOSIS — G603 Idiopathic progressive neuropathy: Secondary | ICD-10-CM | POA: Diagnosis not present

## 2022-09-04 DIAGNOSIS — F0390 Unspecified dementia without behavioral disturbance: Secondary | ICD-10-CM | POA: Diagnosis not present

## 2022-09-22 DIAGNOSIS — E785 Hyperlipidemia, unspecified: Secondary | ICD-10-CM | POA: Diagnosis not present

## 2022-09-22 DIAGNOSIS — F0390 Unspecified dementia without behavioral disturbance: Secondary | ICD-10-CM | POA: Diagnosis not present

## 2022-09-25 DIAGNOSIS — E785 Hyperlipidemia, unspecified: Secondary | ICD-10-CM | POA: Diagnosis not present

## 2022-11-22 DIAGNOSIS — E785 Hyperlipidemia, unspecified: Secondary | ICD-10-CM | POA: Diagnosis not present

## 2022-11-22 DIAGNOSIS — F0390 Unspecified dementia without behavioral disturbance: Secondary | ICD-10-CM | POA: Diagnosis not present

## 2023-01-23 DIAGNOSIS — E785 Hyperlipidemia, unspecified: Secondary | ICD-10-CM | POA: Diagnosis not present

## 2023-01-23 DIAGNOSIS — F0390 Unspecified dementia without behavioral disturbance: Secondary | ICD-10-CM | POA: Diagnosis not present

## 2023-01-24 DIAGNOSIS — E785 Hyperlipidemia, unspecified: Secondary | ICD-10-CM | POA: Diagnosis not present

## 2023-04-11 DIAGNOSIS — F0390 Unspecified dementia without behavioral disturbance: Secondary | ICD-10-CM | POA: Diagnosis not present

## 2023-04-11 DIAGNOSIS — E785 Hyperlipidemia, unspecified: Secondary | ICD-10-CM | POA: Diagnosis not present

## 2023-05-22 DEATH — deceased
